# Patient Record
Sex: Male | Born: 1960 | Race: White | Hispanic: No | Marital: Married | Smoking: Former smoker
Health system: Southern US, Community
[De-identification: ages and names within clinical notes are randomized; demographics above are authoritative.]

## PROBLEM LIST (undated history)

## (undated) DIAGNOSIS — M1990 Unspecified osteoarthritis, unspecified site: Secondary | ICD-10-CM

## (undated) DIAGNOSIS — R519 Headache, unspecified: Secondary | ICD-10-CM

## (undated) DIAGNOSIS — E039 Hypothyroidism, unspecified: Secondary | ICD-10-CM

## (undated) DIAGNOSIS — M151 Heberden's nodes (with arthropathy): Secondary | ICD-10-CM

## (undated) DIAGNOSIS — Z87442 Personal history of urinary calculi: Secondary | ICD-10-CM

## (undated) DIAGNOSIS — E876 Hypokalemia: Secondary | ICD-10-CM

## (undated) DIAGNOSIS — F329 Major depressive disorder, single episode, unspecified: Secondary | ICD-10-CM

## (undated) DIAGNOSIS — T8859XA Other complications of anesthesia, initial encounter: Secondary | ICD-10-CM

## (undated) DIAGNOSIS — G5603 Carpal tunnel syndrome, bilateral upper limbs: Secondary | ICD-10-CM

## (undated) DIAGNOSIS — T4145XA Adverse effect of unspecified anesthetic, initial encounter: Secondary | ICD-10-CM

## (undated) DIAGNOSIS — G4730 Sleep apnea, unspecified: Secondary | ICD-10-CM

## (undated) DIAGNOSIS — R51 Headache: Secondary | ICD-10-CM

## (undated) DIAGNOSIS — R5381 Other malaise: Secondary | ICD-10-CM

## (undated) DIAGNOSIS — F32A Depression, unspecified: Secondary | ICD-10-CM

## (undated) DIAGNOSIS — R5383 Other fatigue: Secondary | ICD-10-CM

## (undated) DIAGNOSIS — I10 Essential (primary) hypertension: Secondary | ICD-10-CM

## (undated) DIAGNOSIS — E785 Hyperlipidemia, unspecified: Secondary | ICD-10-CM

## (undated) DIAGNOSIS — D649 Anemia, unspecified: Secondary | ICD-10-CM

## (undated) HISTORY — PX: CIRCUMCISION: SUR203

---

## 2014-10-26 ENCOUNTER — Encounter: Payer: Self-pay | Admitting: *Deleted

## 2014-10-29 ENCOUNTER — Ambulatory Visit: Payer: BLUE CROSS/BLUE SHIELD | Admitting: Anesthesiology

## 2014-10-29 ENCOUNTER — Encounter
Admission: RE | Disposition: A | Discharge status: Home / Self Care | Payer: Self-pay | Source: Ambulatory Visit | Attending: Gastroenterology

## 2014-10-29 ENCOUNTER — Ambulatory Visit
Admission: RE | Admit: 2014-10-29 | Discharge: 2014-10-29 | Disposition: A | Discharge status: Home / Self Care | Payer: BLUE CROSS/BLUE SHIELD | Source: Ambulatory Visit | Attending: Gastroenterology | Admitting: Gastroenterology

## 2014-10-29 DIAGNOSIS — Z87891 Personal history of nicotine dependence: Secondary | ICD-10-CM | POA: Diagnosis not present

## 2014-10-29 DIAGNOSIS — I10 Essential (primary) hypertension: Secondary | ICD-10-CM | POA: Diagnosis not present

## 2014-10-29 DIAGNOSIS — K635 Polyp of colon: Secondary | ICD-10-CM | POA: Insufficient documentation

## 2014-10-29 DIAGNOSIS — E785 Hyperlipidemia, unspecified: Secondary | ICD-10-CM | POA: Diagnosis not present

## 2014-10-29 DIAGNOSIS — Z1211 Encounter for screening for malignant neoplasm of colon: Secondary | ICD-10-CM | POA: Diagnosis present

## 2014-10-29 DIAGNOSIS — Z888 Allergy status to other drugs, medicaments and biological substances status: Secondary | ICD-10-CM | POA: Diagnosis not present

## 2014-10-29 DIAGNOSIS — D649 Anemia, unspecified: Secondary | ICD-10-CM | POA: Diagnosis not present

## 2014-10-29 DIAGNOSIS — Z7982 Long term (current) use of aspirin: Secondary | ICD-10-CM | POA: Insufficient documentation

## 2014-10-29 DIAGNOSIS — D123 Benign neoplasm of transverse colon: Secondary | ICD-10-CM | POA: Diagnosis not present

## 2014-10-29 DIAGNOSIS — Z880 Allergy status to penicillin: Secondary | ICD-10-CM | POA: Diagnosis not present

## 2014-10-29 DIAGNOSIS — E039 Hypothyroidism, unspecified: Secondary | ICD-10-CM | POA: Insufficient documentation

## 2014-10-29 DIAGNOSIS — D122 Benign neoplasm of ascending colon: Secondary | ICD-10-CM | POA: Insufficient documentation

## 2014-10-29 DIAGNOSIS — Z91011 Allergy to milk products: Secondary | ICD-10-CM | POA: Insufficient documentation

## 2014-10-29 HISTORY — DX: Heberden's nodes (with arthropathy): M15.1

## 2014-10-29 HISTORY — DX: Essential (primary) hypertension: I10

## 2014-10-29 HISTORY — DX: Hypokalemia: E87.6

## 2014-10-29 HISTORY — DX: Unspecified osteoarthritis, unspecified site: M19.90

## 2014-10-29 HISTORY — DX: Anemia, unspecified: D64.9

## 2014-10-29 HISTORY — DX: Hyperlipidemia, unspecified: E78.5

## 2014-10-29 HISTORY — PX: COLONOSCOPY WITH PROPOFOL: SHX5780

## 2014-10-29 HISTORY — DX: Hypothyroidism, unspecified: E03.9

## 2014-10-29 HISTORY — DX: Other malaise: R53.81

## 2014-10-29 HISTORY — DX: Other fatigue: R53.83

## 2014-10-29 SURGERY — COLONOSCOPY WITH PROPOFOL
Anesthesia: General

## 2014-10-29 MED ORDER — PROPOFOL 10 MG/ML IV BOLUS
INTRAVENOUS | Status: DC | PRN
  Administered 2014-10-29: 40 mg via INTRAVENOUS
  Administered 2014-10-29: 30 mg via INTRAVENOUS
  Administered 2014-10-29: 100 mg via INTRAVENOUS

## 2014-10-29 MED ORDER — LIDOCAINE HCL (CARDIAC) 20 MG/ML IV SOLN
INTRAVENOUS | Status: DC | PRN
  Administered 2014-10-29: 20 mg via INTRAVENOUS

## 2014-10-29 MED ORDER — SODIUM CHLORIDE 0.9 % IV SOLN
INTRAVENOUS | Status: DC
Start: 2014-10-29 — End: 2014-10-29

## 2014-10-29 MED ORDER — PROPOFOL INFUSION 10 MG/ML OPTIME
INTRAVENOUS | Status: DC | PRN
  Administered 2014-10-29: 100 ug/kg/min via INTRAVENOUS

## 2014-10-29 MED ORDER — GLYCOPYRROLATE 0.2 MG/ML IJ SOLN
INTRAMUSCULAR | Status: DC | PRN
  Administered 2014-10-29: 0.2 mg via INTRAVENOUS

## 2014-10-29 MED ORDER — SODIUM CHLORIDE 0.9 % IV SOLN
INTRAVENOUS | Status: DC
  Administered 2014-10-29: 1000 mL via INTRAVENOUS

## 2014-10-29 NOTE — Op Note (Signed)
[facility] Gastroenterology Patient Name: Tom Barrett Procedure Date: 10/29/2014 10:02 AM MRN: [id number] Account #: [id number] Date of Birth: 08-May-1960 Admit Type: Outpatient Age: 54 Room: [facility] ENDO ROOM 3 Gender: Male Note Status: Finalized Procedure:         Colonoscopy Indications:       Screening for colorectal malignant neoplasm, This is the                     patient's first colonoscopy Patient Profile:   [name]. Providers:         Gerrit Heck. Rayann Heman, MD Referring MD:      Tressia Danas, MD (Referring MD) Medicines:         Propofol per Anesthesia Complications:     No immediate complications. Procedure:         Pre-Anesthesia Assessment:                    - Prior to the procedure, a History and Physical was                     performed, and patient medications, allergies and                     sensitivities were reviewed. The patient's tolerance of                     previous anesthesia was reviewed.                    After obtaining informed consent, the colonoscope was                     passed under direct vision. Throughout the procedure, the                     patient's blood pressure, pulse, and oxygen saturations                     were monitored continuously. The Colonoscope was                     introduced through the anus and advanced to the the cecum,                     identified by appendiceal orifice and ileocecal valve. The                     colonoscopy was performed without difficulty. The patient                     tolerated the procedure well. The quality of the bowel                     preparation was excellent. Findings:      The perianal and digital rectal examinations were normal.      A 3 mm polyp was found in the cecum. The polyp was sessile. The polyp       was removed with a jumbo cold forceps. Resection and retrieval were       complete.      A 3 mm polyp was found in the ascending colon.  The polyp was sessile.       The polyp was removed with a jumbo cold forceps. Resection and retrieval  were complete.      Three sessile polyps were found at the hepatic flexure. The polyps were       2 to 3 mm in size. These polyps were removed with a jumbo cold forceps.       Resection and retrieval were complete.      The exam was otherwise without abnormality on direct and retroflexion       views. Impression:        - One 3 mm polyp in the cecum. Resected and retrieved.                    - One 3 mm polyp in the ascending colon. Resected and                     retrieved.                    - Three 2 to 3 mm polyps at the hepatic flexure. Resected                     and retrieved.                    - The examination was otherwise normal on direct and                     retroflexion views. Recommendation:    - Observe patient in GI recovery unit.                    - High fiber diet.                    - Continue present medications.                    - Await pathology results.                    - Repeat colonoscopy for surveillance based on pathology                     results.                    - The findings and recommendations were discussed with the                     patient.                    - The findings and recommendations were discussed with the                     patient's family. Procedure Code(s): --- Professional ---                    [phone], Colonoscopy, flexible; with biopsy, single or                     multiple CPT copyright 2014 American Medical Association. All rights reserved. The codes documented in this report are preliminary and upon coder review may  be revised to meet current compliance requirements. Mellody Life, MD 10/29/2014 11:05:43 AM This report has been signed electronically. Number of Addenda: 0 Note Initiated On: 10/29/2014 10:02 AM Scope Withdrawal Time: 0 hours 36 minutes 25 seconds  Total Procedure Duration: 0 hours 44  minutes 4 seconds  [facility]

## 2014-10-29 NOTE — Anesthesia Preprocedure Evaluation (Signed)
Anesthesia Evaluation  Patient identified by MRN, date of birth, ID band Patient awake    Reviewed: Allergy & Precautions, H&P , NPO status , Patient's Chart, lab work & pertinent test results, reviewed documented beta blocker date and time   History of Anesthesia Complications Negative for: history of anesthetic complications  Airway Mallampati: II  TM Distance: >3 FB Neck ROM: full    Dental no notable dental hx. (+) Missing, Poor Dentition   Pulmonary neg shortness of breath, neg sleep apnea, neg COPDneg recent URI, former smoker,  breath sounds clear to auscultation  Pulmonary exam normal       Cardiovascular Exercise Tolerance: Good hypertension, - angina- CAD, - Past MI, - Cardiac Stents and - CABG Normal cardiovascular exam- dysrhythmias - Valvular Problems/MurmursRhythm:regular Rate:Normal     Neuro/Psych negative neurological ROS  negative psych ROS   GI/Hepatic negative GI ROS, Neg liver ROS,   Endo/Other  neg diabetesHypothyroidism   Renal/GU negative Renal ROS  negative genitourinary   Musculoskeletal   Abdominal   Peds  Hematology  (+) Blood dyscrasia, anemia ,   Anesthesia Other Findings Past Medical History:   Anemia                                                       Arthritis                                                    Heberden's nodes                                             Malaise and fatigue                                          Hypertension                                                 Hypothyroidism                                               Hyperlipemia                                                 Hypokalemia                                                  Reproductive/Obstetrics negative OB ROS  Anesthesia Physical Anesthesia Plan  ASA: II  Anesthesia Plan: General   Post-op Pain Management:    Induction:    Airway Management Planned:   Additional Equipment:   Intra-op Plan:   Post-operative Plan:   Informed Consent: I have reviewed the patients History and Physical, chart, labs and discussed the procedure including the risks, benefits and alternatives for the proposed anesthesia with the patient or authorized representative who has indicated his/her understanding and acceptance.   Dental Advisory Given  Plan Discussed with: Anesthesiologist, CRNA and Surgeon  Anesthesia Plan Comments:         Anesthesia Quick Evaluation

## 2014-10-29 NOTE — H&P (Signed)
  Primary Care Physician:  Petra Kuba, MD  Pre-Procedure History & Physical: HPI:  Tom Barrett is a 54 y.o. male is here for an colonoscopy.   Past Medical History  Diagnosis Date  . Anemia   . Arthritis   . Heberden's nodes   . Malaise and fatigue   . Hypertension   . Hypothyroidism   . Hyperlipemia   . Hypokalemia     Past Surgical History  Procedure Laterality Date  . Circumcision      Prior to Admission medications   Medication Sig Start Date End Date Taking? Authorizing Provider  aspirin EC 81 MG tablet Take 81 mg by mouth daily.   Yes Historical Provider, MD  diltiazem (CARDIZEM CD) 240 MG 24 hr capsule Take 240 mg by mouth daily.   Yes Historical Provider, MD  doxazosin (CARDURA) 8 MG tablet Take 16 mg by mouth daily.   Yes Historical Provider, MD  hydrochlorothiazide (HYDRODIURIL) 25 MG tablet Take 25 mg by mouth daily.   Yes Historical Provider, MD  levothyroxine (SYNTHROID, LEVOTHROID) 175 MCG tablet Take 175 mcg by mouth daily before breakfast.   Yes Historical Provider, MD  potassium chloride (K-DUR) 10 MEQ tablet Take 10 mEq by mouth 2 (two) times daily.   Yes Historical Provider, MD  quinapril (ACCUPRIL) 40 MG tablet Take 40 mg by mouth 2 (two) times daily.   Yes Historical Provider, MD    Allergies as of 10/02/2014  . (Not on File)    History reviewed. No pertinent family history.  Social History   Social History  . Marital Status: Married    Spouse Name: N/A  . Number of Children: N/A  . Years of Education: N/A   Occupational History  . Not on file.   Social History Main Topics  . Smoking status: Former Research scientist (life sciences)  . Smokeless tobacco: Not on file  . Alcohol Use: No  . Drug Use: No  . Sexual Activity: Not on file   Other Topics Concern  . Not on file   Social History Narrative     Physical Exam: BP 143/94 mmHg  Pulse 90  Temp(Src) 97 F (36.1 C) (Tympanic)  Resp 18  Ht 5\' 11"  (1.803 m)  Wt 102.059 kg (225 lb)  BMI 31.39  kg/m2  SpO2 98% General:   Alert,  pleasant and cooperative in NAD Head:  Normocephalic and atraumatic. Neck:  Supple; no masses or thyromegaly. Lungs:  Clear throughout to auscultation.    Heart:  Regular rate and rhythm. Abdomen:  Soft, nontender and nondistended. Normal bowel sounds, without guarding, and without rebound.   Neurologic:  Alert and  oriented x4;  grossly normal neurologically.  Impression/Plan: TAJUAN DUFAULT is here for an colonoscopy to be performed for screening  Risks, benefits, limitations, and alternatives regarding  colonoscopy have been reviewed with the patient.  Questions have been answered.  All parties agreeable.   Josefine Class, MD  10/29/2014, 10:08 AM

## 2014-10-29 NOTE — Transfer of Care (Signed)
Immediate Anesthesia Transfer of Care Note  Patient: Tom Barrett  Procedure(s) Performed: Procedure(s): COLONOSCOPY WITH PROPOFOL (N/A)  Patient Location: Endoscopy Unit  Anesthesia Type:General  Level of Consciousness: sedated  Airway & Oxygen Therapy: Patient Spontanous Breathing and Patient connected to nasal cannula oxygen  Post-op Assessment: Report given to RN and Post -op Vital signs reviewed and stable  Post vital signs: Reviewed  Last Vitals:  Filed Vitals:   10/29/14 0922  BP: 143/94  Pulse: 90  Temp: 36.1 C  Resp: 18    Complications: No apparent anesthesia complications

## 2014-10-29 NOTE — Anesthesia Procedure Notes (Signed)
Date/Time: 10/29/2014 10:10 AM Performed by: Martha Clan Pre-anesthesia Checklist: Patient identified, Emergency Drugs available, Suction available, Patient being monitored and Timeout performed Patient Re-evaluated:Patient Re-evaluated prior to inductionOxygen Delivery Method: Nasal cannula Preoxygenation: Pre-oxygenation with 100% oxygen Intubation Type: IV induction

## 2014-10-29 NOTE — Discharge Instructions (Signed)

## 2014-10-29 NOTE — Anesthesia Postprocedure Evaluation (Signed)
  Anesthesia Post-op Note  Patient: Tom Barrett  Procedure(s) Performed: Procedure(s): COLONOSCOPY WITH PROPOFOL (N/A)  Anesthesia type:General  Patient location: PACU  Post pain: Pain level controlled  Post assessment: Post-op Vital signs reviewed, Patient's Cardiovascular Status Stable, Respiratory Function Stable, Patent Airway and No signs of Nausea or vomiting  Post vital signs: Reviewed and stable  Last Vitals:  Filed Vitals:   10/29/14 1140  BP: 136/85  Pulse: 57  Temp:   Resp: 13    Level of consciousness: awake, alert  and patient cooperative  Complications: No apparent anesthesia complications

## 2014-10-30 ENCOUNTER — Encounter: Payer: Self-pay | Admitting: Gastroenterology

## 2014-10-30 LAB — SURGICAL PATHOLOGY

## 2015-04-10 ENCOUNTER — Other Ambulatory Visit: Payer: Self-pay | Admitting: Physician Assistant

## 2015-04-26 ENCOUNTER — Ambulatory Visit: Payer: BLUE CROSS/BLUE SHIELD | Attending: Otolaryngology

## 2015-04-26 DIAGNOSIS — R0683 Snoring: Secondary | ICD-10-CM | POA: Insufficient documentation

## 2015-04-26 DIAGNOSIS — G4733 Obstructive sleep apnea (adult) (pediatric): Secondary | ICD-10-CM | POA: Diagnosis not present

## 2015-06-20 ENCOUNTER — Ambulatory Visit: Payer: BLUE CROSS/BLUE SHIELD | Attending: Otolaryngology

## 2015-06-20 DIAGNOSIS — G4733 Obstructive sleep apnea (adult) (pediatric): Secondary | ICD-10-CM | POA: Diagnosis present

## 2017-11-04 ENCOUNTER — Other Ambulatory Visit: Payer: Self-pay

## 2017-11-04 ENCOUNTER — Encounter
Admission: RE | Admit: 2017-11-04 | Discharge: 2017-11-04 | Disposition: A | Discharge status: Home / Self Care | Payer: BLUE CROSS/BLUE SHIELD | Source: Ambulatory Visit | Attending: Surgery | Admitting: Surgery

## 2017-11-04 DIAGNOSIS — Z01812 Encounter for preprocedural laboratory examination: Secondary | ICD-10-CM | POA: Insufficient documentation

## 2017-11-04 DIAGNOSIS — Z01810 Encounter for preprocedural cardiovascular examination: Secondary | ICD-10-CM | POA: Diagnosis not present

## 2017-11-04 DIAGNOSIS — I517 Cardiomegaly: Secondary | ICD-10-CM | POA: Insufficient documentation

## 2017-11-04 DIAGNOSIS — R9431 Abnormal electrocardiogram [ECG] [EKG]: Secondary | ICD-10-CM | POA: Insufficient documentation

## 2017-11-04 HISTORY — DX: Major depressive disorder, single episode, unspecified: F32.9

## 2017-11-04 HISTORY — DX: Headache, unspecified: R51.9

## 2017-11-04 HISTORY — DX: Adverse effect of unspecified anesthetic, initial encounter: T41.45XA

## 2017-11-04 HISTORY — DX: Depression, unspecified: F32.A

## 2017-11-04 HISTORY — DX: Personal history of urinary calculi: Z87.442

## 2017-11-04 HISTORY — DX: Headache: R51

## 2017-11-04 HISTORY — DX: Other complications of anesthesia, initial encounter: T88.59XA

## 2017-11-04 LAB — URINALYSIS, ROUTINE W REFLEX MICROSCOPIC
Bilirubin Urine: NEGATIVE
Glucose, UA: NEGATIVE mg/dL
Hgb urine dipstick: NEGATIVE
Ketones, ur: NEGATIVE mg/dL
Leukocytes, UA: NEGATIVE
NITRITE: NEGATIVE
Protein, ur: NEGATIVE mg/dL
Specific Gravity, Urine: 1.016 (ref 1.005–1.030)
pH: 6 (ref 5.0–8.0)

## 2017-11-04 LAB — CBC WITH DIFFERENTIAL/PLATELET
BASOS ABS: 0.1 10*3/uL (ref 0–0.1)
BASOS PCT: 1 %
EOS ABS: 0.2 10*3/uL (ref 0–0.7)
Eosinophils Relative: 3 %
HCT: 40.9 % (ref 40.0–52.0)
HEMOGLOBIN: 14.2 g/dL (ref 13.0–18.0)
Lymphocytes Relative: 24 %
Lymphs Abs: 1.6 10*3/uL (ref 1.0–3.6)
MCH: 30.6 pg (ref 26.0–34.0)
MCHC: 34.6 g/dL (ref 32.0–36.0)
MCV: 88.6 fL (ref 80.0–100.0)
MONOS PCT: 8 %
Monocytes Absolute: 0.5 10*3/uL (ref 0.2–1.0)
NEUTROS ABS: 4.3 10*3/uL (ref 1.4–6.5)
NEUTROS PCT: 64 %
Platelets: 209 10*3/uL (ref 150–440)
RBC: 4.62 MIL/uL (ref 4.40–5.90)
RDW: 13.3 % (ref 11.5–14.5)
WBC: 6.6 10*3/uL (ref 3.8–10.6)

## 2017-11-04 LAB — COMPREHENSIVE METABOLIC PANEL
ALBUMIN: 4.2 g/dL (ref 3.5–5.0)
ALK PHOS: 75 U/L (ref 38–126)
ALT: 22 U/L (ref 0–44)
ANION GAP: 7 (ref 5–15)
AST: 20 U/L (ref 15–41)
BUN: 17 mg/dL (ref 6–20)
CO2: 28 mmol/L (ref 22–32)
Calcium: 9.2 mg/dL (ref 8.9–10.3)
Chloride: 106 mmol/L (ref 98–111)
Creatinine, Ser: 0.86 mg/dL (ref 0.61–1.24)
GFR calc Af Amer: >60 mL/min (ref >60)
GFR calc non Af Amer: >60 mL/min (ref >60)
GLUCOSE: 87 mg/dL (ref 70–99)
POTASSIUM: 3.2 mmol/L — AB (ref 3.5–5.1)
SODIUM: 141 mmol/L (ref 135–145)
Total Bilirubin: 0.5 mg/dL (ref 0.3–1.2)
Total Protein: 7 g/dL (ref 6.5–8.1)

## 2017-11-04 LAB — PROTIME-INR
INR: 0.98
Prothrombin Time: 12.9 seconds (ref 11.4–15.2)

## 2017-11-04 LAB — TYPE AND SCREEN
ABO/RH(D): O NEG
ANTIBODY SCREEN: NEGATIVE

## 2017-11-04 LAB — SURGICAL PCR SCREEN
MRSA, PCR: NEGATIVE
STAPHYLOCOCCUS AUREUS: NEGATIVE

## 2017-11-04 NOTE — Patient Instructions (Signed)
Your procedure is scheduled on: Tuesday, November 09, 2017  Report to [place]    DO NOT STOP ON THE FIRST FLOOR TO REGISTER  To find out your arrival time please call [phone] between 1PM - 3PM on Monday, November 08, 2017  Remember: Instructions that are not followed completely may result in serious medical risk,  up to and including death, or upon the discretion of your surgeon and anesthesiologist your  surgery may need to be rescheduled.     _X__ 1. Do not eat food after midnight the night before your procedure.                 No gum chewing or hard candies.                       ABSOLUTELY NOTHING SOLID IN YOUR MOUTH AFTER MIDNIGHT                  You may drink clear liquids up to 2 hours before you are scheduled to arrive for your surgery-                   DO not drink clear liquids within 2 hours of the start of your surgery.                 Clear Liquids include:  water, apple juice without pulp, clear carbohydrate                 drink such as Clearfast of Gatorade, Black Coffee or Tea (Do not add                 anything to coffee or tea).  __X__2.  On the morning of surgery brush your teeth with toothpaste and water, you                may rinse your mouth with mouthwash if you wish.  Do not swallow any toothpaste of mouthwash.     _X__ 3.  No Alcohol for 24 hours before or after surgery.   _X__ 4.  Do Not Smoke or use e-cigarettes For 24 Hours Prior to Your Surgery.                 Do not use any chewable tobacco products for at least 6 hours prior to                 surgery.  ____  5.  Bring all medications with you on the day of surgery if instructed.   ____  6.  Notify your doctor if there is any change in your medical condition      (cold, fever, infections).     Do not wear jewelry, make-up, hairpins, clips or nail polish. Do not wear lotions, powders, or perfumes. You may wear deodorant. Do not shave  48 hours prior to surgery. Men may shave face and neck. Do not bring valuables to the hospital.    [facility] is not responsible for any belongings or valuables.  Contacts, dentures or bridgework may not be worn into surgery. Leave your suitcase in the car. After surgery it may be brought to your room. For patients admitted to the hospital, discharge time is determined by your treatment team.   Patients discharged the day of surgery will not be allowed to drive home.   Please read over the following fact sheets that you were  given:   PREPARING FOR YOUR SURGERY     ____ Take these medicines the morning of surgery with A SIP OF WATER:    1.DILTIAZEM  2. SYNTHROID  3.   4.  5.  6.  ____ Fleet Enema (as directed)   __X_ Use CHG Soap as directed  ___X_ Stop ALL ASPIRIN PRODUCTS AS OF TODAY  __X__ Stop Anti-inflammatories AS OF TODAY           THIS INCLUDES IBUPROFEN / MOTRIN / ADVIL / ALEVE   __X__ Stop supplements until after surgery.             THIS INCLUDES KELP AND PYGEUM  ____ Bring C-Pap to the hospital.   CONTINUE TAKING HYDROCHLOROTHIAZIDE AND ACCUPRIL AS USUAL, JUST DO NOT TAKE THEM         ON THE MORNING OF SURGERY  CONTINUE TAKING CARDURA AT NIGHT AS USUAL.  YOU MAY TAKE TYLENOL AT ANY TIME  WEAR STURDY SHOES TO [place], ALONG WITH LOOSE FITTING PANTS.

## 2017-11-05 NOTE — Pre-Procedure Instructions (Signed)
Potassium results of 3.2 sent to Dr. Roland Rack and Anesthesia for review.

## 2017-11-05 NOTE — Pre-Procedure Instructions (Signed)
Discussed abnormal EKG with Dr. Ronelle Nigh. After his review, he requested the PCP also look at EKG and compare it to any completed EKG that may have been done in the office. If it still appears different from prior EKGs, then have PCP send on to a cardiologist. Copy of EKG sent to Dr. Rabinowitz(PCP) and Dr. Roland Rack [facility]).

## 2017-11-08 ENCOUNTER — Encounter: Payer: Self-pay | Admitting: Anesthesiology

## 2017-11-08 MED ORDER — CLINDAMYCIN PHOSPHATE 900 MG/50ML IV SOLN
900.0000 mg | Freq: Once | INTRAVENOUS | Status: AC
  Administered 2017-11-09: 900 mg via INTRAVENOUS

## 2017-11-08 NOTE — Pre-Procedure Instructions (Signed)
Tom Barrett, at pcp called. States repeat ekg this am normal and will fax it and old. Ok by dr Rosey Bath

## 2017-11-09 ENCOUNTER — Encounter
Admission: RE | Disposition: A | Discharge status: Home Health | Payer: Self-pay | Source: Home / Self Care | Attending: Surgery

## 2017-11-09 ENCOUNTER — Inpatient Hospital Stay: Payer: BLUE CROSS/BLUE SHIELD | Admitting: Anesthesiology

## 2017-11-09 ENCOUNTER — Encounter: Payer: Self-pay | Admitting: *Deleted

## 2017-11-09 ENCOUNTER — Inpatient Hospital Stay
Admission: RE | Admit: 2017-11-09 | Discharge: 2017-11-10 | DRG: 470 | Disposition: A | Discharge status: Home Health | Payer: BLUE CROSS/BLUE SHIELD | Attending: Surgery | Admitting: Surgery

## 2017-11-09 ENCOUNTER — Other Ambulatory Visit: Payer: Self-pay

## 2017-11-09 ENCOUNTER — Inpatient Hospital Stay: Payer: BLUE CROSS/BLUE SHIELD

## 2017-11-09 DIAGNOSIS — Z87442 Personal history of urinary calculi: Secondary | ICD-10-CM | POA: Diagnosis not present

## 2017-11-09 DIAGNOSIS — I10 Essential (primary) hypertension: Secondary | ICD-10-CM | POA: Diagnosis present

## 2017-11-09 DIAGNOSIS — Z87891 Personal history of nicotine dependence: Secondary | ICD-10-CM | POA: Diagnosis not present

## 2017-11-09 DIAGNOSIS — Z880 Allergy status to penicillin: Secondary | ICD-10-CM | POA: Diagnosis not present

## 2017-11-09 DIAGNOSIS — G43909 Migraine, unspecified, not intractable, without status migrainosus: Secondary | ICD-10-CM | POA: Diagnosis present

## 2017-11-09 DIAGNOSIS — E785 Hyperlipidemia, unspecified: Secondary | ICD-10-CM | POA: Diagnosis present

## 2017-11-09 DIAGNOSIS — Z888 Allergy status to other drugs, medicaments and biological substances status: Secondary | ICD-10-CM | POA: Diagnosis not present

## 2017-11-09 DIAGNOSIS — M25561 Pain in right knee: Secondary | ICD-10-CM | POA: Diagnosis present

## 2017-11-09 DIAGNOSIS — Z91011 Allergy to milk products: Secondary | ICD-10-CM

## 2017-11-09 DIAGNOSIS — Z96651 Presence of right artificial knee joint: Secondary | ICD-10-CM

## 2017-11-09 DIAGNOSIS — Z79890 Hormone replacement therapy (postmenopausal): Secondary | ICD-10-CM | POA: Diagnosis not present

## 2017-11-09 DIAGNOSIS — E039 Hypothyroidism, unspecified: Secondary | ICD-10-CM | POA: Diagnosis present

## 2017-11-09 DIAGNOSIS — G4730 Sleep apnea, unspecified: Secondary | ICD-10-CM | POA: Diagnosis present

## 2017-11-09 DIAGNOSIS — Z79899 Other long term (current) drug therapy: Secondary | ICD-10-CM | POA: Diagnosis not present

## 2017-11-09 DIAGNOSIS — M1711 Unilateral primary osteoarthritis, right knee: Secondary | ICD-10-CM | POA: Diagnosis present

## 2017-11-09 HISTORY — PX: TOTAL KNEE ARTHROPLASTY: SHX125

## 2017-11-09 LAB — POCT I-STAT 4, (NA,K, GLUC, HGB,HCT)
GLUCOSE: 98 mg/dL (ref 70–99)
HCT: 39 % (ref 39.0–52.0)
HEMOGLOBIN: 13.3 g/dL (ref 13.0–17.0)
POTASSIUM: 3.2 mmol/L — AB (ref 3.5–5.1)
SODIUM: 142 mmol/L (ref 135–145)

## 2017-11-09 LAB — ABO/RH: ABO/RH(D): O NEG

## 2017-11-09 IMAGING — DX DG KNEE 1-2V PORT*R*
2 series · 2 of 2 positions shown · non-contrast
Comparison: None.

CLINICAL DATA: 57-year-old male post right knee replacement.
Initial encounter.

EXAM:
PORTABLE RIGHT KNEE - 1-2 VIEW

[knee ap]
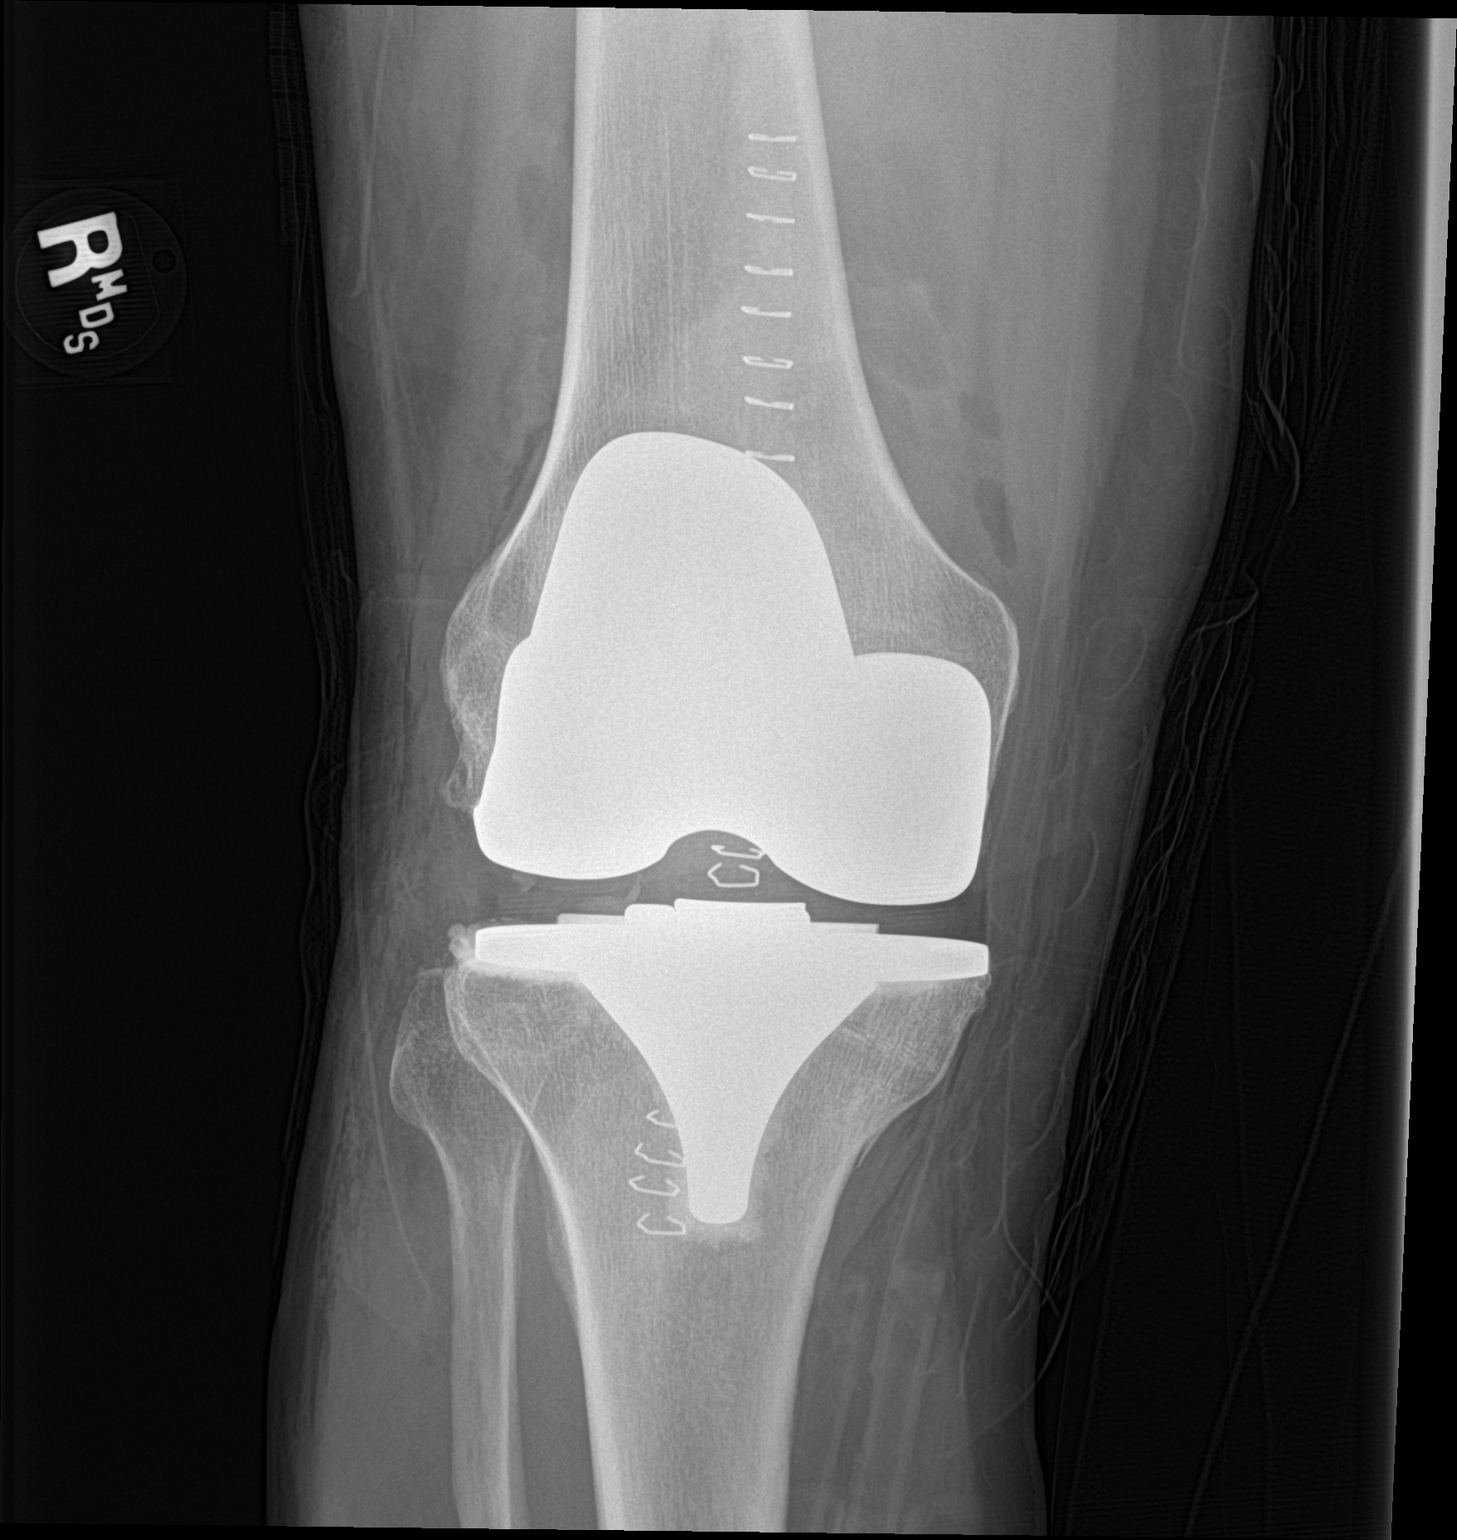

[knee lat]
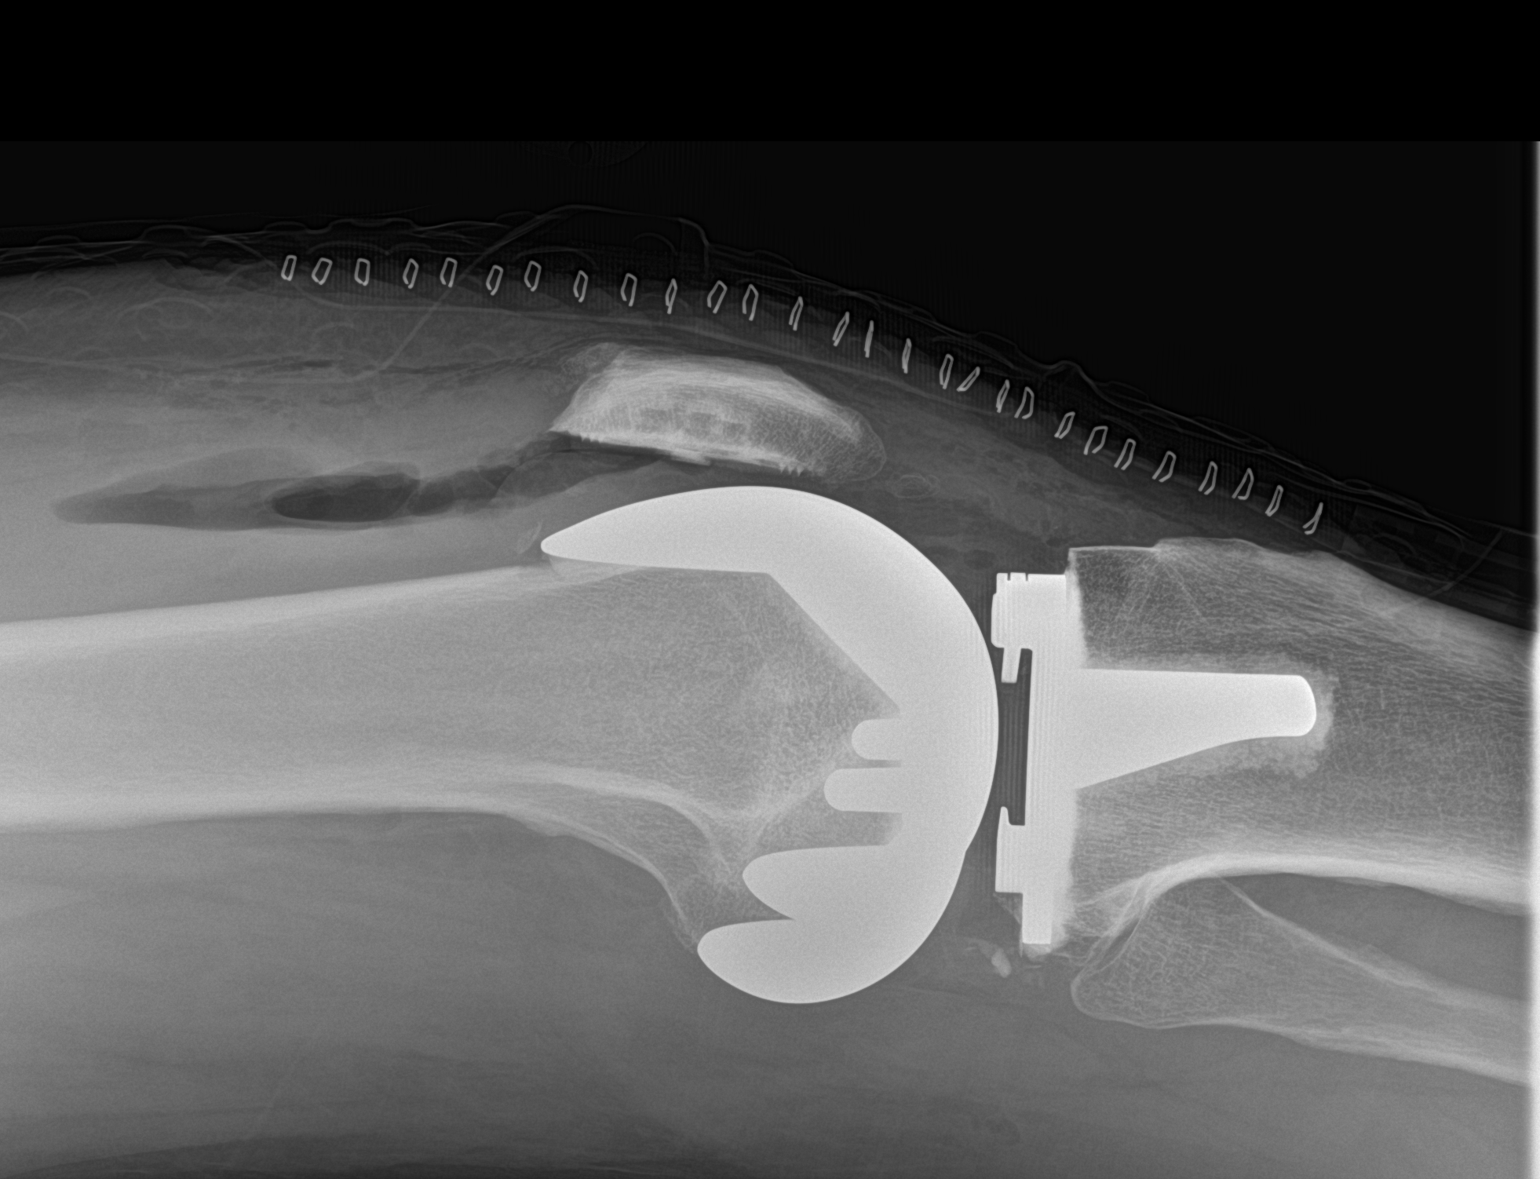

[2 of 2 positions shown; findings below may reference images not displayed]

FINDINGS: Post total right knee replacement which appears in satisfactory
position without complication noted.
IMPRESSION: Post total right knee replacement.

## 2017-11-09 SURGERY — ARTHROPLASTY, KNEE, TOTAL
Anesthesia: Spinal | Site: Knee | Laterality: Right | Wound class: Clean

## 2017-11-09 MED ORDER — MAGNESIUM HYDROXIDE 400 MG/5ML PO SUSP
30.0000 mL | Freq: Every day | ORAL | Status: DC | PRN
  Administered 2017-11-09 – 2017-11-10 (×2): 30 mL via ORAL
  Filled 2017-11-09 (×2): qty 30

## 2017-11-09 MED ORDER — ROCURONIUM BROMIDE 100 MG/10ML IV SOLN
INTRAVENOUS | Status: DC | PRN
  Administered 2017-11-09: 80 mg via INTRAVENOUS

## 2017-11-09 MED ORDER — TRANEXAMIC ACID 1000 MG/10ML IV SOLN
INTRAVENOUS | Status: AC
  Filled 2017-11-09: qty 10

## 2017-11-09 MED ORDER — LACTATED RINGERS IV SOLN
INTRAVENOUS | Status: DC
  Administered 2017-11-09: 07:00:00 via INTRAVENOUS

## 2017-11-09 MED ORDER — FENTANYL CITRATE (PF) 100 MCG/2ML IJ SOLN
INTRAMUSCULAR | Status: DC | PRN
  Administered 2017-11-09 (×2): 50 ug via INTRAVENOUS

## 2017-11-09 MED ORDER — DEXAMETHASONE SODIUM PHOSPHATE 4 MG/ML IJ SOLN
INTRAMUSCULAR | Status: DC | PRN
  Administered 2017-11-09: 5 mg via INTRAVENOUS

## 2017-11-09 MED ORDER — SODIUM CHLORIDE 0.9 % IJ SOLN
INTRAMUSCULAR | Status: AC
  Filled 2017-11-09: qty 50

## 2017-11-09 MED ORDER — DOCUSATE SODIUM 100 MG PO CAPS
100.0000 mg | ORAL_CAPSULE | Freq: Two times a day (BID) | ORAL | Status: DC
  Administered 2017-11-09 – 2017-11-10 (×3): 100 mg via ORAL
  Filled 2017-11-09 (×3): qty 1

## 2017-11-09 MED ORDER — PHENYLEPHRINE HCL 10 MG/ML IJ SOLN
INTRAMUSCULAR | Status: AC
  Filled 2017-11-09: qty 1

## 2017-11-09 MED ORDER — ACETAMINOPHEN 10 MG/ML IV SOLN
INTRAVENOUS | Status: AC
  Filled 2017-11-09: qty 100

## 2017-11-09 MED ORDER — FAMOTIDINE 20 MG PO TABS
ORAL_TABLET | ORAL | Status: AC
  Filled 2017-11-09: qty 1

## 2017-11-09 MED ORDER — ONDANSETRON HCL 4 MG/2ML IJ SOLN: 4.0000 mg | Freq: Four times a day (QID) | INTRAMUSCULAR | Status: DC | PRN

## 2017-11-09 MED ORDER — OXYCODONE HCL 5 MG PO TABS
5.0000 mg | ORAL_TABLET | ORAL | Status: DC | PRN
  Administered 2017-11-09: 5 mg via ORAL

## 2017-11-09 MED ORDER — LIDOCAINE HCL (CARDIAC) PF 100 MG/5ML IV SOSY
PREFILLED_SYRINGE | INTRAVENOUS | Status: DC | PRN
  Administered 2017-11-09: 100 mg via INTRAVENOUS

## 2017-11-09 MED ORDER — FENTANYL CITRATE (PF) 100 MCG/2ML IJ SOLN
INTRAMUSCULAR | Status: AC
  Administered 2017-11-09: 25 ug via INTRAVENOUS
  Filled 2017-11-09: qty 2

## 2017-11-09 MED ORDER — FAMOTIDINE 20 MG PO TABS
20.0000 mg | ORAL_TABLET | Freq: Once | ORAL | Status: AC
  Administered 2017-11-09: 20 mg via ORAL

## 2017-11-09 MED ORDER — KELP 0.15 MG PO TABS: ORAL_TABLET | Freq: Every day | ORAL | Status: DC

## 2017-11-09 MED ORDER — METOCLOPRAMIDE HCL 5 MG/ML IJ SOLN: 5.0000 mg | Freq: Three times a day (TID) | INTRAMUSCULAR | Status: DC | PRN

## 2017-11-09 MED ORDER — BUPIVACAINE LIPOSOME 1.3 % IJ SUSP
INTRAMUSCULAR | Status: AC
  Filled 2017-11-09: qty 20

## 2017-11-09 MED ORDER — MIDAZOLAM HCL 2 MG/2ML IJ SOLN
INTRAMUSCULAR | Status: AC
  Filled 2017-11-09: qty 2

## 2017-11-09 MED ORDER — HYDROMORPHONE HCL 1 MG/ML IJ SOLN: 0.5000 mg | INTRAMUSCULAR | Status: DC | PRN

## 2017-11-09 MED ORDER — FENTANYL CITRATE (PF) 100 MCG/2ML IJ SOLN
25.0000 ug | INTRAMUSCULAR | Status: AC | PRN
  Administered 2017-11-09 (×6): 25 ug via INTRAVENOUS

## 2017-11-09 MED ORDER — GLYCOPYRROLATE 0.2 MG/ML IJ SOLN
INTRAMUSCULAR | Status: AC
  Filled 2017-11-09: qty 1

## 2017-11-09 MED ORDER — LIDOCAINE HCL (PF) 2 % IJ SOLN
INTRAMUSCULAR | Status: AC
  Filled 2017-11-09: qty 10

## 2017-11-09 MED ORDER — NEOMYCIN-POLYMYXIN B GU 40-200000 IR SOLN
Status: DC | PRN
  Administered 2017-11-09: 14 mL

## 2017-11-09 MED ORDER — TRANEXAMIC ACID 1000 MG/10ML IV SOLN
INTRAVENOUS | Status: DC | PRN
  Administered 2017-11-09: 1000 mg via INTRAVENOUS

## 2017-11-09 MED ORDER — HYDROMORPHONE HCL 1 MG/ML IJ SOLN
INTRAMUSCULAR | Status: AC
  Filled 2017-11-09: qty 1

## 2017-11-09 MED ORDER — BUPIVACAINE-EPINEPHRINE (PF) 0.5% -1:200000 IJ SOLN
INTRAMUSCULAR | Status: AC
  Filled 2017-11-09: qty 30

## 2017-11-09 MED ORDER — REMIFENTANIL HCL 1 MG IV SOLR
INTRAVENOUS | Status: DC | PRN
  Administered 2017-11-09: .15 ug/kg/min via INTRAVENOUS

## 2017-11-09 MED ORDER — CLINDAMYCIN PHOSPHATE 900 MG/50ML IV SOLN
INTRAVENOUS | Status: AC
  Filled 2017-11-09: qty 50

## 2017-11-09 MED ORDER — PROPOFOL 10 MG/ML IV BOLUS
INTRAVENOUS | Status: AC
  Filled 2017-11-09: qty 20

## 2017-11-09 MED ORDER — MIDAZOLAM HCL 5 MG/5ML IJ SOLN
INTRAMUSCULAR | Status: DC | PRN
  Administered 2017-11-09: 2 mg via INTRAVENOUS

## 2017-11-09 MED ORDER — BUPIVACAINE-EPINEPHRINE (PF) 0.5% -1:200000 IJ SOLN
INTRAMUSCULAR | Status: DC | PRN
  Administered 2017-11-09: 30 mL via PERINEURAL

## 2017-11-09 MED ORDER — NETTLE-PYGEUM AFRICANUM 300-25 MG PO CAPS: ORAL_CAPSULE | Freq: Every day | ORAL | Status: DC

## 2017-11-09 MED ORDER — QUINAPRIL HCL 10 MG PO TABS
40.0000 mg | ORAL_TABLET | Freq: Two times a day (BID) | ORAL | Status: DC
  Administered 2017-11-09 – 2017-11-10 (×2): 40 mg via ORAL
  Filled 2017-11-09 (×3): qty 4

## 2017-11-09 MED ORDER — DILTIAZEM HCL ER COATED BEADS 240 MG PO CP24
240.0000 mg | ORAL_CAPSULE | Freq: Every day | ORAL | Status: DC
  Administered 2017-11-10: 240 mg via ORAL
  Filled 2017-11-09: qty 1

## 2017-11-09 MED ORDER — SUGAMMADEX SODIUM 200 MG/2ML IV SOLN
INTRAVENOUS | Status: DC | PRN
  Administered 2017-11-09: 200 mg via INTRAVENOUS

## 2017-11-09 MED ORDER — CLINDAMYCIN PHOSPHATE 900 MG/50ML IV SOLN
900.0000 mg | Freq: Four times a day (QID) | INTRAVENOUS | Status: AC
  Administered 2017-11-09 – 2017-11-10 (×3): 900 mg via INTRAVENOUS
  Filled 2017-11-09 (×3): qty 50

## 2017-11-09 MED ORDER — ACETAMINOPHEN 325 MG PO TABS: 325.0000 mg | ORAL_TABLET | Freq: Four times a day (QID) | ORAL | Status: DC | PRN

## 2017-11-09 MED ORDER — FLEET ENEMA 7-19 GM/118ML RE ENEM: 1.0000 | ENEMA | Freq: Once | RECTAL | Status: DC | PRN

## 2017-11-09 MED ORDER — DOXAZOSIN MESYLATE 8 MG PO TABS
8.0000 mg | ORAL_TABLET | Freq: Every day | ORAL | Status: DC
  Administered 2017-11-09 – 2017-11-10 (×2): 8 mg via ORAL
  Filled 2017-11-09 (×2): qty 1

## 2017-11-09 MED ORDER — ONDANSETRON HCL 4 MG/2ML IJ SOLN: 4.0000 mg | Freq: Once | INTRAMUSCULAR | Status: DC | PRN

## 2017-11-09 MED ORDER — PROPOFOL 10 MG/ML IV BOLUS
INTRAVENOUS | Status: DC | PRN
  Administered 2017-11-09: 180 mg via INTRAVENOUS

## 2017-11-09 MED ORDER — DIPHENHYDRAMINE HCL 12.5 MG/5ML PO ELIX: 12.5000 mg | ORAL_SOLUTION | ORAL | Status: DC | PRN

## 2017-11-09 MED ORDER — EPHEDRINE SULFATE 50 MG/ML IJ SOLN
INTRAMUSCULAR | Status: DC | PRN
  Administered 2017-11-09: 10 mg via INTRAVENOUS

## 2017-11-09 MED ORDER — SUGAMMADEX SODIUM 200 MG/2ML IV SOLN
INTRAVENOUS | Status: AC
  Filled 2017-11-09: qty 2

## 2017-11-09 MED ORDER — ENOXAPARIN SODIUM 40 MG/0.4ML ~~LOC~~ SOLN
40.0000 mg | SUBCUTANEOUS | Status: DC
  Administered 2017-11-10: 40 mg via SUBCUTANEOUS
  Filled 2017-11-09: qty 0.4

## 2017-11-09 MED ORDER — ACETAMINOPHEN 500 MG PO TABS
1000.0000 mg | ORAL_TABLET | Freq: Four times a day (QID) | ORAL | Status: DC
  Administered 2017-11-09 – 2017-11-10 (×3): 1000 mg via ORAL
  Filled 2017-11-09 (×3): qty 2

## 2017-11-09 MED ORDER — KETOROLAC TROMETHAMINE 15 MG/ML IJ SOLN
15.0000 mg | Freq: Four times a day (QID) | INTRAMUSCULAR | Status: DC
  Administered 2017-11-09 – 2017-11-10 (×3): 15 mg via INTRAVENOUS
  Filled 2017-11-09 (×3): qty 1

## 2017-11-09 MED ORDER — PROPOFOL 500 MG/50ML IV EMUL
INTRAVENOUS | Status: AC
  Filled 2017-11-09: qty 50

## 2017-11-09 MED ORDER — BISACODYL 10 MG RE SUPP: 10.0000 mg | Freq: Every day | RECTAL | Status: DC | PRN

## 2017-11-09 MED ORDER — TRAMADOL HCL 50 MG PO TABS
50.0000 mg | ORAL_TABLET | Freq: Four times a day (QID) | ORAL | Status: DC | PRN
  Filled 2017-11-09: qty 1

## 2017-11-09 MED ORDER — ACETAMINOPHEN 10 MG/ML IV SOLN
INTRAVENOUS | Status: DC | PRN
  Administered 2017-11-09: 1000 mg via INTRAVENOUS

## 2017-11-09 MED ORDER — ONDANSETRON HCL 4 MG PO TABS: 4.0000 mg | ORAL_TABLET | Freq: Four times a day (QID) | ORAL | Status: DC | PRN

## 2017-11-09 MED ORDER — KETOROLAC TROMETHAMINE 30 MG/ML IJ SOLN
30.0000 mg | Freq: Once | INTRAMUSCULAR | Status: AC
  Administered 2017-11-09: 30 mg via INTRAVENOUS

## 2017-11-09 MED ORDER — OXYCODONE HCL 5 MG PO TABS
ORAL_TABLET | ORAL | Status: AC
  Administered 2017-11-09: 5 mg via ORAL
  Filled 2017-11-09: qty 1

## 2017-11-09 MED ORDER — NEOMYCIN-POLYMYXIN B GU 40-200000 IR SOLN
Status: AC
  Filled 2017-11-09: qty 20

## 2017-11-09 MED ORDER — HYDROCHLOROTHIAZIDE 12.5 MG PO CAPS
12.5000 mg | ORAL_CAPSULE | Freq: Every day | ORAL | Status: DC
  Administered 2017-11-09 – 2017-11-10 (×2): 12.5 mg via ORAL
  Filled 2017-11-09 (×2): qty 1

## 2017-11-09 MED ORDER — POTASSIUM CHLORIDE IN NACL 20-0.9 MEQ/L-% IV SOLN
INTRAVENOUS | Status: DC
  Administered 2017-11-09: 14:00:00 via INTRAVENOUS
  Filled 2017-11-09 (×3): qty 1000

## 2017-11-09 MED ORDER — REMIFENTANIL HCL 1 MG IV SOLR
INTRAVENOUS | Status: AC
  Filled 2017-11-09: qty 1000

## 2017-11-09 MED ORDER — LEVOTHYROXINE SODIUM 137 MCG PO TABS
274.0000 ug | ORAL_TABLET | Freq: Every day | ORAL | Status: DC
  Administered 2017-11-10: 274 ug via ORAL
  Filled 2017-11-09: qty 2

## 2017-11-09 MED ORDER — KETOROLAC TROMETHAMINE 30 MG/ML IJ SOLN
INTRAMUSCULAR | Status: AC
  Administered 2017-11-09: 30 mg via INTRAVENOUS
  Filled 2017-11-09: qty 1

## 2017-11-09 MED ORDER — PANTOPRAZOLE SODIUM 40 MG PO TBEC
40.0000 mg | DELAYED_RELEASE_TABLET | Freq: Every day | ORAL | Status: DC
  Administered 2017-11-09 – 2017-11-10 (×2): 40 mg via ORAL
  Filled 2017-11-09 (×2): qty 1

## 2017-11-09 MED ORDER — SODIUM CHLORIDE 0.9 % IV SOLN
INTRAVENOUS | Status: DC | PRN
  Administered 2017-11-09: 60 mL

## 2017-11-09 MED ORDER — METOCLOPRAMIDE HCL 10 MG PO TABS: 5.0000 mg | ORAL_TABLET | Freq: Three times a day (TID) | ORAL | Status: DC | PRN

## 2017-11-09 MED ORDER — ONDANSETRON HCL 4 MG/2ML IJ SOLN
INTRAMUSCULAR | Status: DC | PRN
  Administered 2017-11-09: 4 mg via INTRAVENOUS

## 2017-11-09 MED ORDER — FENTANYL CITRATE (PF) 100 MCG/2ML IJ SOLN
INTRAMUSCULAR | Status: AC
  Filled 2017-11-09: qty 2

## 2017-11-09 MED ORDER — BUPIVACAINE HCL (PF) 0.5 % IJ SOLN
INTRAMUSCULAR | Status: AC
  Filled 2017-11-09: qty 30

## 2017-11-09 SURGICAL SUPPLY — 61 items
BANDAGE ELASTIC 6 LF NS (GAUZE/BANDAGES/DRESSINGS) ×2 IMPLANT
BEARING TIBIAL KNEE AS 10M 79M (Knees) ×2 IMPLANT
BLADE SAW SAG 25X90X1.19 (BLADE) ×2 IMPLANT
BLADE SURG SZ20 CARB STEEL (BLADE) ×2 IMPLANT
CANISTER SUCT 1200ML W/VALVE (MISCELLANEOUS) ×2 IMPLANT
CANISTER SUCT 3000ML PPV (MISCELLANEOUS) ×2 IMPLANT
CEMENT BONE R 1X40 (Cement) ×4 IMPLANT
CEMENT VACUUM MIXING SYSTEM (MISCELLANEOUS) ×2 IMPLANT
CHLORAPREP W/TINT 26ML (MISCELLANEOUS) ×2 IMPLANT
COMP FEMORAL CRUC RIGHT 75MM (Joint) ×2 IMPLANT
COMPONENT FEMRL CRUC RT 75MM (Joint) ×1 IMPLANT
COOLER POLAR GLACIER W/PUMP (MISCELLANEOUS) ×2 IMPLANT
COVER MAYO STAND STRL (DRAPES) ×2 IMPLANT
CUFF TOURN 24 STER (MISCELLANEOUS) IMPLANT
CUFF TOURN 30 STER DUAL PORT (MISCELLANEOUS) ×2 IMPLANT
DRAPE IMP U-DRAPE 54X76 (DRAPES) ×2 IMPLANT
DRAPE INCISE IOBAN 66X45 STRL (DRAPES) ×2 IMPLANT
DRAPE SHEET LG 3/4 BI-LAMINATE (DRAPES) ×2 IMPLANT
DRSG OPSITE POSTOP 4X10 (GAUZE/BANDAGES/DRESSINGS) ×2 IMPLANT
DRSG OPSITE POSTOP 4X8 (GAUZE/BANDAGES/DRESSINGS) IMPLANT
DRSG TEGADERM 2-3/8X2-3/4 SM (GAUZE/BANDAGES/DRESSINGS) ×2 IMPLANT
ELECT CAUTERY BLADE 6.4 (BLADE) ×2 IMPLANT
ELECT REM PT RETURN 9FT ADLT (ELECTROSURGICAL) ×2
ELECTRODE REM PT RTRN 9FT ADLT (ELECTROSURGICAL) ×1 IMPLANT
GLOVE BIO SURGEON STRL SZ7.5 (GLOVE) ×8 IMPLANT
GLOVE BIO SURGEON STRL SZ8 (GLOVE) ×8 IMPLANT
GLOVE BIOGEL PI IND STRL 8 (GLOVE) ×1 IMPLANT
GLOVE BIOGEL PI INDICATOR 8 (GLOVE) ×1
GLOVE INDICATOR 8.0 STRL GRN (GLOVE) ×2 IMPLANT
GOWN STRL REUS W/ TWL LRG LVL3 (GOWN DISPOSABLE) ×1 IMPLANT
GOWN STRL REUS W/ TWL XL LVL3 (GOWN DISPOSABLE) ×1 IMPLANT
GOWN STRL REUS W/TWL LRG LVL3 (GOWN DISPOSABLE) ×1
GOWN STRL REUS W/TWL XL LVL3 (GOWN DISPOSABLE) ×1
HOLDER FOLEY CATH W/STRAP (MISCELLANEOUS) ×2 IMPLANT
HOOD PEEL AWAY FLYTE STAYCOOL (MISCELLANEOUS) ×6 IMPLANT
IMMBOLIZER KNEE 19 BLUE UNIV (SOFTGOODS) ×2 IMPLANT
KIT TURNOVER KIT A (KITS) ×2 IMPLANT
NDL SAFETY ECLIPSE 18X1.5 (NEEDLE) ×2 IMPLANT
NEEDLE HYPO 18GX1.5 SHARP (NEEDLE) ×2
NEEDLE SPNL 20GX3.5 QUINCKE YW (NEEDLE) ×2 IMPLANT
NS IRRIG 1000ML POUR BTL (IV SOLUTION) ×2 IMPLANT
PACK TOTAL KNEE (MISCELLANEOUS) ×2 IMPLANT
PAD WRAPON POLAR KNEE (MISCELLANEOUS) ×1 IMPLANT
PEG PATELLA SERIES A 37MMX10MM (Orthopedic Implant) ×2 IMPLANT
PLATE KNEE TIBIAL 79MM FIXED (Plate) ×2 IMPLANT
PULSAVAC PLUS IRRIG FAN TIP (DISPOSABLE) ×2
SOL .9 NS 3000ML IRR  AL (IV SOLUTION) ×1
SOL .9 NS 3000ML IRR UROMATIC (IV SOLUTION) ×1 IMPLANT
SPONGE GAUZE 2X2 8PLY STRL LF (GAUZE/BANDAGES/DRESSINGS) ×2 IMPLANT
STAPLER SKIN PROX 35W (STAPLE) ×2 IMPLANT
SUCTION FRAZIER HANDLE 10FR (MISCELLANEOUS) ×1
SUCTION TUBE FRAZIER 10FR DISP (MISCELLANEOUS) ×1 IMPLANT
SUT VIC AB 0 CT1 36 (SUTURE) ×6 IMPLANT
SUT VIC AB 2-0 CT1 27 (SUTURE) ×3
SUT VIC AB 2-0 CT1 TAPERPNT 27 (SUTURE) ×3 IMPLANT
SYR 10ML LL (SYRINGE) ×2 IMPLANT
SYR 20CC LL (SYRINGE) ×2 IMPLANT
SYR 30ML LL (SYRINGE) ×6 IMPLANT
TIP FAN IRRIG PULSAVAC PLUS (DISPOSABLE) ×1 IMPLANT
TRAY FOLEY MTR SLVR 16FR STAT (SET/KITS/TRAYS/PACK) IMPLANT
WRAPON POLAR PAD KNEE (MISCELLANEOUS) ×2

## 2017-11-09 NOTE — Progress Notes (Signed)
PHARMACIST - PHYSICIAN ORDER COMMUNICATION  CONCERNING: P&T Medication Policy on Herbal Medications  DESCRIPTION:  This patient's order for:  Kelp and nettle-pygeum africanum  has been noted.  This product(s) is classified as an "herbal" or natural product. Due to a lack of definitive safety studies or FDA approval, nonstandard manufacturing practices, plus the potential risk of unknown drug-drug interactions while on inpatient medications, the Pharmacy and Therapeutics Committee does not permit the use of "herbal" or natural products of this type within [facility].   ACTION TAKEN: The pharmacy department is unable to verify this order at this time  Please reevaluate patient's clinical condition at discharge and address if the herbal or natural product(s) should be resumed at that time.

## 2017-11-09 NOTE — Progress Notes (Signed)
   11/09/17 0700  Clinical Encounter Type  Visited With Patient and family together  Visit Type Initial;Spiritual support  Recommendations Follow-up as requested.  Spiritual Encounters  Spiritual Needs Prayer;Emotional  Stress Factors  Patient Stress Factors  (Pre-surgery nervousness.)   Chaplain observed that the patient seemed anxious. Chaplain engaged the patient and wife in conversation, talking through concerns about the surgery and post-op recovery. Patient appeared to relax and was less jittery. Chaplain provided a calming presence and prayed for the patient and surgical team.

## 2017-11-09 NOTE — Anesthesia Procedure Notes (Signed)
Procedure Name: Intubation Date/Time: 11/09/2017 8:24 AM Performed by: Bernardo Heater, CRNA Pre-anesthesia Checklist: Patient identified, Emergency Drugs available, Suction available and Patient being monitored Patient Re-evaluated:Patient Re-evaluated prior to induction Oxygen Delivery Method: Circle system utilized Preoxygenation: Pre-oxygenation with 100% oxygen Induction Type: IV induction Laryngoscope Size: Mac and 3 Grade View: Grade I Tube size: 7.0 mm Number of attempts: 1 Placement Confirmation: ETT inserted through vocal cords under direct vision,  positive ETCO2 and breath sounds checked- equal and bilateral Secured at: 23 cm Tube secured with: Tape Dental Injury: Teeth and Oropharynx as per pre-operative assessment

## 2017-11-09 NOTE — Evaluation (Signed)
Physical Therapy Evaluation Patient Details Name: Tom Barrett MRN: [id number] DOB: 03-08-1960 Today's Date: 11/09/2017   History of Present Illness  57 y/o male here s/p R TKA 9/10.  Clinical Impression  Pt did very well with POD0 PT exam.  He was able to ambulate well with walker, had 2-110 ROM, was able to do SLRs and showed independent with bed mobility along with good tolerance with WBing.  He was very motivated to push himself and is eager to get back to his PLOF.  Pt participated with ~15 minutes of supine exercises and ~10 minutes of gait/mobility training apart from the PT exam and overall did well with all tasks.      Follow Up Recommendations Follow surgeon's recommendation for DC plan and follow-up therapies;Home health PT    Equipment Recommendations       Recommendations for Other Services       Precautions / Restrictions Precautions Precautions: Knee;Fall Restrictions Weight Bearing Restrictions: Yes RLE Weight Bearing: Weight bearing as tolerated      Mobility  Bed Mobility Overal bed mobility: Independent             General bed mobility comments: Pt able to easily get to sitting EOB w/o assist  Transfers Overall transfer level: Independent Equipment used: Rolling walker (2 wheeled)             General transfer comment: Pt able to rise to standing w/o assist.  Showed good confidence, safety but unable to achieve TKE   Ambulation/Gait Ambulation/Gait assistance: Supervision Gait Distance (Feet): 100 Feet Assistive device: Rolling walker (2 wheeled)       General Gait Details: Pt is able to ambulate with constant forward walker motion and good safety.  He was able to improve mechanics of working toward TKE/heel strike but did have some issue with this.  Otherwise excellent first bout of walking post-op.  Stairs            Wheelchair Mobility    Modified Rankin (Stroke Patients Only)       Balance Overall balance assessment:  Modified Independent                                           Pertinent Vitals/Pain Pain Assessment: 0-10 Pain Score: 3 (at rest, increases considerable with mvt)    Home Living Family/patient expects to be discharged to:: Private residence Living Arrangements: Spouse/significant other Available Help at Discharge: Family (wife is returning to work Fri 9/13)  Type of Home: House Home Access: Stairs to enter Entrance Stairs-Rails: Left;Right(WIDE) Technical brewer of Steps: 5 Home Layout: Multi-level;Able to live on main level with bedroom/bathroom        Prior Function Level of Independence: Independent         Comments: Pt able to be very active, does 20 flights of steps QD for work     Hand Dominance        Extremity/Trunk Assessment   Upper Extremity Assessment Upper Extremity Assessment: Overall WFL for tasks assessed    Lower Extremity Assessment Lower Extremity Assessment: Overall WFL for tasks assessed       Communication   Communication: No difficulties  Cognition Arousal/Alertness: Awake/alert Behavior During Therapy: WFL for tasks assessed/performed Overall Cognitive Status: Within Functional Limits for tasks assessed  General Comments      Exercises Total Joint Exercises Ankle Circles/Pumps: Strengthening;10 reps Quad Sets: Strengthening;10 reps Gluteal Sets: Strengthening;10 reps Heel Slides: Strengthening;AROM;10 reps Hip ABduction/ADduction: Strengthening;10 reps Straight Leg Raises: AAROM;AROM;10 reps Knee Flexion: PROM;5 reps Goniometric ROM: 2-110 (103 degrees AROM)   Assessment/Plan    PT Assessment Patient needs continued PT services  PT Problem List Decreased strength;Decreased activity tolerance;Decreased range of motion;Decreased balance;Decreased mobility;Decreased coordination;Decreased knowledge of use of DME;Decreased safety awareness;Pain        PT Treatment Interventions DME instruction;Gait training;Stair training;Functional mobility training;Therapeutic activities;Therapeutic exercise;Balance training;Cognitive remediation;Patient/family education    PT Goals (Current goals can be found in the Care Plan section)  Acute Rehab PT Goals Patient Stated Goal: get back to work PT Goal Formulation: With patient Time For Goal Achievement: 11/23/17 Potential to Achieve Goals: Good    Frequency BID   Barriers to discharge        Co-evaluation               AM-PAC PT "6 Clicks" Daily Activity  Outcome Measure Difficulty turning over in bed (including adjusting bedclothes, sheets and blankets)?: None Difficulty moving from lying on back to sitting on the side of the bed? : None Difficulty sitting down on and standing up from a chair with arms (e.g., wheelchair, bedside commode, etc,.)?: None Help needed moving to and from a bed to chair (including a wheelchair)?: None Help needed walking in hospital room?: None Help needed climbing 3-5 steps with a railing? : A Little 6 Click Score: 23    End of Session Equipment Utilized During Treatment: Gait belt Activity Tolerance: Patient tolerated treatment well Patient left: with chair alarm set;with call bell/phone within reach;with family/visitor present Nurse Communication: Mobility status PT Visit Diagnosis: Muscle weakness (generalized) (M62.81);Difficulty in walking, not elsewhere classified (R26.2)    Time: 2800-3491 PT Time Calculation (min) (ACUTE ONLY): 44 min   Charges:   PT Evaluation $PT Eval Low Complexity: 1 Low PT Treatments $Gait Training: 8-22 mins $Therapeutic Exercise: 8-22 mins        Kreg Shropshire, DPT 11/09/2017, 5:31 PM

## 2017-11-09 NOTE — H&P (Signed)
Paper H&P to be scanned into permanent record. H&P reviewed and patient re-examined. No changes. 

## 2017-11-09 NOTE — Op Note (Signed)
11/09/2017  10:42 AM  Patient:   Tom Barrett  Pre-Op Diagnosis:   Degenerative joint disease, right knee.  Post-Op Diagnosis:   Same  Procedure:   Right TKA using all-cemented Biomet Vanguard system with a 75 mm PCR femur, a 79 mm tibial tray with a 10 mm AS E-poly insert, and a 37 x 10 mm all-poly 3-pegged domed patella.  Surgeon:   Pascal Lux, MD  Assistant:   Cameron Proud, PA-C   Anesthesia:   Attempted spinal to GET  Findings:   As above  Complications:   None  EBL:   10 cc  Fluids:   1000 cc crystalloid  UOP:   None  TT:   96 minutes at 300 mmHg  Drains:   None  Closure:   Staples  Implants:   As above  Brief Clinical Note:   The patient is a 57 year old male with a long history of progressively worsening right knee pain. The patient's symptoms have progressed despite medications, activity modification, injections, etc. The patient's history and examination were consistent with advanced degenerative joint disease of the right knee confirmed by plain radiographs. The patient presents at this time for a right total knee arthroplasty.  Procedure:   The patient was brought into the operating room. After an attempted spinal anesthesia was unsuccessful, the patient was lain in the supine position where he underwent accessible general endotracheal intubation and anesthesia. The right lower extremity was prepped with ChloraPrep solution and draped sterilely. Preoperative antibiotics were administered. After verifying the proper laterality with a surgical timeout, the limb was exsanguinated with an Esmarch and the tourniquet inflated to 300 mmHg. A standard anterior approach to the knee was made through an approximately 7 inch incision. The incision was carried down through the subcutaneous tissues to expose superficial retinaculum. This was split the length of the incision and the medial flap elevated sufficiently to expose the medial retinaculum. The medial retinaculum was  incised, leaving a 3-4 mm cuff of tissue on the patella. This was extended distally along the medial border of the patellar tendon and proximally through the medial third of the quadriceps tendon. A subtotal fat pad excision was performed before the soft tissues were elevated off the anteromedial and anterolateral aspects of the proximal tibia to the level of the collateral ligaments. The anterior portions of the medial and lateral menisci were removed, as was the anterior cruciate ligament. With the knee flexed to 90, the external tibial guide was positioned and the appropriate proximal tibial cut made. This piece was taken to the back table where it was measured and found to be optimally replicated by a 79 mm component.  Attention was directed to the distal femur. The intramedullary canal was accessed through a 3/8" drill hole. The intramedullary guide was inserted and position in order to obtain a neutral flexion gap. The intercondylar block was positioned with care taken to avoid notching the anterior cortex of the femur. The appropriate cut was made. Next, the distal cutting block was placed at 6 of valgus alignment. Using the 9 mm slot, the distal cut was made. The distal femur was measured and found to be optimally replicated by the 75 mm component. The 75 mm 4-in-1 cutting block was positioned and first the posterior, then the posterior chamfer, the anterior chamfer, femoral and intercondylar cuts were made. At this point, the posterior portions medial and lateral menisci were removed. A trial reduction was performed using the appropriate femoral and tibial components  with the 10 mm insert. This demonstrated excellent stability to varus and valgus stressing both in flexion and extension while permitting full extension. Patella tracking was assessed and found to be excellent. Therefore, the tibial guide position was marked on the proximal tibia. The patella thickness was measured and found to be 23 mm.  Therefore, the appropriate cut was made. The patellar surface was measured and found to be optimally replicated by the 37 mm component. The three peg holes were drilled in place before the trial button was inserted. Patella tracking was assessed and found to be excellent, passing the "no thumb test". The lug holes were drilled into the distal femur before the trial component was removed, leaving only the tibial tray. The keel was then created using the appropriate tower, reamer, and punch.  The bony surfaces were prepared for cementing by irrigating thoroughly with bacitracin saline solution. A bone plug was fashioned from some of the bone that had been removed previously and used to plug the distal femoral canal. In addition, 20 cc of Exparel diluted out to 60 cc with normal saline and 30 cc of 0.5% Sensorcaine were injected into the postero-medial and postero-lateral aspects of the knee, the medial and lateral gutter regions, and the peri-incisional tissues to help with postoperative analgesia. Meanwhile, the cement was being mixed on the back table. When it was ready, the tibial tray was cemented in first. The excess cement was removed using [employer] fast streamer. Next, the femoral component was impacted into place. Again, the excess cement was removed using [employer] fast streamer. The 10 mm trial insert was positioned and the knee brought into extension while the cement hardened. Finally, the patella was cemented into place and secured using the patellar clamp. Again, the excess cement was removed using [employer] fast streamer. Once the cement had hardened, the knee was placed through a range of motion with the findings as described above. Therefore, the trial insert was removed and, after verifying that no cement had been retained posteriorly, the permanent 10 mm AS E-polyethylene insert was positioned and secured using the appropriate key locking mechanism. Again the knee was placed through a range of motion with the findings as  described above.  The wound was copiously irrigated with bacitracin saline solution using the jet lavage system before the quadriceps tendon and retinacular layer were reapproximated using #0 Vicryl interrupted sutures. The superficial retinacular layer also was closed using a running #0 Vicryl suture. A total of 10 cc of transexemic acid (TXA) was injected intra-articularly before the subcutaneous tissues were closed in several layers using 2-0 Vicryl interrupted sutures. The skin was closed using staples. A sterile honeycomb dressing was applied to the skin before the leg was wrapped with an Ace wrap to accommodate the Polar Care device. The patient was then awakened, extubated, and returned to the recovery room in satisfactory condition after tolerating the procedure well.

## 2017-11-09 NOTE — NC FL2 (Signed)
[place] LEVEL OF CARE SCREENING TOOL     IDENTIFICATION  Patient Name: Tom Barrett Birthdate: Jun 28, 1960 Sex: male Admission Date (Current Location): 11/09/2017  [place] and [place] Number:  Engineering geologist and Address:  [facility], [address], [place], [place] [address]      Provider Number: [id number]  Attending Physician Name and Address:  Corky Mull, MD  Relative Name and Phone Number:       Current Level of Care: Hospital Recommended Level of Care: [place] Prior Approval Number:    Date Approved/Denied:   PASRR Number: [id number])  Discharge Plan: SNF    Current Diagnoses: Patient Active Problem List   Diagnosis Date Noted  . Status post total knee replacement using cement, right 11/09/2017    Orientation RESPIRATION BLADDER Height & Weight     Self, Time, Situation, Place  Normal Continent Weight: 233 lb 14.5 oz (106.1 kg) Height:  5\' 11"  (180.3 cm)  BEHAVIORAL SYMPTOMS/MOOD NEUROLOGICAL BOWEL NUTRITION STATUS      Continent Diet(Diet: Heart Healthy/ Carb Modified. )  AMBULATORY STATUS COMMUNICATION OF NEEDS Skin   Extensive Assist Verbally Surgical wounds(Incision: Right Knee. )                       Personal Care Assistance Level of Assistance  Bathing, Feeding, Dressing Bathing Assistance: Limited assistance Feeding assistance: Independent Dressing Assistance: Limited assistance     Functional Limitations Info  Sight, Hearing, Speech Sight Info: Adequate Hearing Info: Adequate Speech Info: Adequate    SPECIAL CARE FACTORS FREQUENCY  PT (By licensed PT), OT (By licensed OT)     PT Frequency: (5) OT Frequency: (5)            Contractures      Additional Factors Info  Code Status, Allergies Code Status Info: (Full Code. ) Allergies Info: (Milk-related Compounds, Amlodipine, Metoprolol, Penicillins)           Current Medications (11/09/2017):  This is the  current hospital active medication list Current Facility-Administered Medications  Medication Dose Route Frequency Provider Last Rate Last Dose  . 0.9 % NaCl with KCl 20 mEq/ L  infusion   Intravenous Continuous Poggi, Marshall Cork, MD 75 mL/hr at 11/09/17 1419    . acetaminophen (TYLENOL) tablet 1,000 mg  1,000 mg Oral Q6H Poggi, Marshall Cork, MD   1,000 mg at 11/09/17 1650  . [START ON 11/10/2017] acetaminophen (TYLENOL) tablet 325-650 mg  325-650 mg Oral Q6H PRN Poggi, Marshall Cork, MD      . bisacodyl (DULCOLAX) suppository 10 mg  10 mg Rectal Daily PRN Poggi, Marshall Cork, MD      . clindamycin (CLEOCIN) IVPB 900 mg  900 mg Intravenous Q6H Poggi, Marshall Cork, MD 100 mL/hr at 11/09/17 1421 900 mg at 11/09/17 1421  . [START ON 11/10/2017] diltiazem (CARDIZEM CD) 24 hr capsule 240 mg  240 mg Oral Daily Poggi, Marshall Cork, MD      . diphenhydrAMINE (BENADRYL) 12.5 MG/5ML elixir 12.5-25 mg  12.5-25 mg Oral Q4H PRN Poggi, Marshall Cork, MD      . docusate sodium (COLACE) capsule 100 mg  100 mg Oral BID Poggi, Marshall Cork, MD   100 mg at 11/09/17 1424  . doxazosin (CARDURA) tablet 8 mg  8 mg Oral Daily Poggi, Marshall Cork, MD   8 mg at 11/09/17 1425  . [START ON 11/10/2017] enoxaparin (LOVENOX) injection 40 mg  40 mg Subcutaneous  Q24H Poggi, Marshall Cork, MD      . famotidine (PEPCID) 20 MG tablet           . hydrochlorothiazide (MICROZIDE) capsule 12.5 mg  12.5 mg Oral Daily Poggi, Marshall Cork, MD   12.5 mg at 11/09/17 1424  . HYDROmorphone (DILAUDID) injection 0.5-1 mg  0.5-1 mg Intravenous Q4H PRN Poggi, Marshall Cork, MD      . ketorolac (TORADOL) 15 MG/ML injection 15 mg  15 mg Intravenous Q6H Poggi, Marshall Cork, MD   15 mg at 11/09/17 1649  . [START ON 11/10/2017] levothyroxine (SYNTHROID, LEVOTHROID) tablet 274 mcg  274 mcg Oral QAC breakfast Poggi, Marshall Cork, MD      . magnesium hydroxide (MILK OF MAGNESIA) suspension 30 mL  30 mL Oral Daily PRN Poggi, Marshall Cork, MD   30 mL at 11/09/17 1426  . metoCLOPramide (REGLAN) tablet 5-10 mg  5-10 mg Oral Q8H PRN Poggi, Marshall Cork, MD        Or  . metoCLOPramide (REGLAN) injection 5-10 mg  5-10 mg Intravenous Q8H PRN Poggi, Marshall Cork, MD      . ondansetron (ZOFRAN) tablet 4 mg  4 mg Oral Q6H PRN Poggi, Marshall Cork, MD       Or  . ondansetron (ZOFRAN) injection 4 mg  4 mg Intravenous Q6H PRN Poggi, Marshall Cork, MD      . oxyCODONE (Oxy IR/ROXICODONE) immediate release tablet 5-10 mg  5-10 mg Oral Q4H PRN Poggi, Marshall Cork, MD   5 mg at 11/09/17 1147  . pantoprazole (PROTONIX) EC tablet 40 mg  40 mg Oral Daily Poggi, Marshall Cork, MD   40 mg at 11/09/17 1424  . quinapril (ACCUPRIL) tablet 40 mg  40 mg Oral BID Corky Mull, MD   40 mg at 11/09/17 1651  . sodium phosphate (FLEET) 7-19 GM/118ML enema 1 enema  1 enema Rectal Once PRN Poggi, Marshall Cork, MD      . traMADol Veatrice Bourbon) tablet 50 mg  50 mg Oral Q6H PRN Poggi, Marshall Cork, MD         Discharge Medications: Please see discharge summary for a list of discharge medications.  Relevant Imaging Results:  Relevant Lab Results:   Additional Information (SSN: [id number])  Cigi Bega, Veronia Beets, LCSW

## 2017-11-09 NOTE — Anesthesia Preprocedure Evaluation (Addendum)
Anesthesia Evaluation  Patient identified by MRN, date of birth, ID band Patient awake    Reviewed: Allergy & Precautions, NPO status , Patient's Chart, lab work & pertinent test results, reviewed documented beta blocker date and time   Airway Mallampati: III  TM Distance: >3 FB     Dental  (+) Chipped   Pulmonary former smoker,           Cardiovascular hypertension, Pt. on medications      Neuro/Psych  Headaches, PSYCHIATRIC DISORDERS Depression    GI/Hepatic   Endo/Other  Hypothyroidism   Renal/GU      Musculoskeletal  (+) Arthritis ,   Abdominal   Peds  Hematology  (+) anemia ,   Anesthesia Other Findings EKG ok.  Reproductive/Obstetrics                             Anesthesia Physical Anesthesia Plan  ASA: II  Anesthesia Plan: Spinal and General   Post-op Pain Management:    Induction:   PONV Risk Score and Plan:   Airway Management Planned:   Additional Equipment:   Intra-op Plan:   Post-operative Plan:   Informed Consent: I have reviewed the patients History and Physical, chart, labs and discussed the procedure including the risks, benefits and alternatives for the proposed anesthesia with the patient or authorized representative who has indicated his/her understanding and acceptance.     Plan Discussed with: CRNA  Anesthesia Plan Comments:        Anesthesia Quick Evaluation

## 2017-11-09 NOTE — Transfer of Care (Signed)
Immediate Anesthesia Transfer of Care Note  Patient: Tom Barrett  Procedure(s) Performed: TOTAL KNEE ARTHROPLASTY (Right Knee)  Patient Location: PACU  Anesthesia Type:General  Level of Consciousness: awake and patient cooperative  Airway & Oxygen Therapy: Patient Spontanous Breathing and Patient connected to nasal cannula oxygen  Post-op Assessment: Report given to RN and Post -op Vital signs reviewed and stable  Post vital signs: Reviewed and stable  Last Vitals:  Vitals Value Taken Time  BP    Temp    Pulse 99 11/09/2017 10:41 AM  Resp 10 11/09/2017 10:41 AM  SpO2 98 % 11/09/2017 10:41 AM  Vitals shown include unvalidated device data.  Last Pain:  Vitals:   11/09/17 0604  TempSrc: Oral  PainSc: 2          Complications: No apparent anesthesia complications

## 2017-11-09 NOTE — Progress Notes (Signed)
Chaplain responded to an OR for anAD, Pt was still feeling the effects from surgery. Family ws at the the bedside. Chaplain left brochure for review. Family will call when and if needed. Family had no additional needs.    11/09/17 1304  Clinical Encounter Type  Visited With Patient and family together  Visit Type Follow-up  Referral From Nurse  Spiritual Encounters  Spiritual Needs Brochure  Advance Directives (For Healthcare)  Does Patient Have a Medical Advance Directive? No  Would patient like information on creating a medical advance directive? Yes (Inpatient - patient requests chaplain consult to create a medical advance directive)  [place]  Would patient like information on creating a mental health advance directive? No - Patient declined

## 2017-11-09 NOTE — Anesthesia Post-op Follow-up Note (Signed)
Anesthesia QCDR form completed.        

## 2017-11-09 NOTE — Progress Notes (Signed)
Pt received from PACU via hospital bed without incident per MD order accompanied by wife. Rept received from [facility] from PACU. Pt vital signs stable on admission. No s/sx distress and no c/o such. Pt denies pain on admit to the unit. Pt and wife oriented to call bed, bed alarm and unit routines. Both verbalize understanding. Will continue to monitor.

## 2017-11-10 ENCOUNTER — Encounter: Payer: Self-pay | Admitting: Surgery

## 2017-11-10 LAB — BASIC METABOLIC PANEL
Anion gap: 9 (ref 5–15)
BUN: 18 mg/dL (ref 6–20)
CALCIUM: 9 mg/dL (ref 8.9–10.3)
CO2: 27 mmol/L (ref 22–32)
CREATININE: 0.98 mg/dL (ref 0.61–1.24)
Chloride: 103 mmol/L (ref 98–111)
GFR calc Af Amer: >60 mL/min (ref >60)
GLUCOSE: 120 mg/dL — AB (ref 70–99)
Potassium: 3.4 mmol/L — ABNORMAL LOW (ref 3.5–5.1)
Sodium: 139 mmol/L (ref 135–145)

## 2017-11-10 LAB — CBC WITH DIFFERENTIAL/PLATELET
BASOS PCT: 0 %
Band Neutrophils: 0 %
Basophils Absolute: 0 10*3/uL (ref 0–0.1)
Blasts: 0 %
EOS PCT: 0 %
Eosinophils Absolute: 0 10*3/uL (ref 0–0.7)
HCT: 38.4 % — ABNORMAL LOW (ref 40.0–52.0)
HEMOGLOBIN: 13.2 g/dL (ref 13.0–18.0)
LYMPHS ABS: 0.7 10*3/uL — AB (ref 1.0–3.6)
Lymphocytes Relative: 6 %
MCH: 30.2 pg (ref 26.0–34.0)
MCHC: 34.3 g/dL (ref 32.0–36.0)
MCV: 88.1 fL (ref 80.0–100.0)
MONOS PCT: 7 %
MYELOCYTES: 0 %
Metamyelocytes Relative: 0 %
Monocytes Absolute: 0.8 10*3/uL (ref 0.2–1.0)
NEUTROS ABS: 10.5 10*3/uL — AB (ref 1.4–6.5)
NEUTROS PCT: 87 %
OTHER: 0 %
PLATELETS: 181 10*3/uL (ref 150–440)
Promyelocytes Relative: 0 %
RBC: 4.36 MIL/uL — AB (ref 4.40–5.90)
RDW: 13.7 % (ref 11.5–14.5)
WBC: 12 10*3/uL — ABNORMAL HIGH (ref 3.8–10.6)
nRBC: 0 /100 WBC

## 2017-11-10 MED ORDER — POTASSIUM CHLORIDE 20 MEQ PO PACK
20.0000 meq | PACK | Freq: Once | ORAL | Status: AC
  Administered 2017-11-10: 20 meq via ORAL
  Filled 2017-11-10: qty 1

## 2017-11-10 MED ORDER — ENOXAPARIN SODIUM 40 MG/0.4ML ~~LOC~~ SOLN: 40.0000 mg | SUBCUTANEOUS | 0 refills | Status: DC

## 2017-11-10 MED ORDER — OXYCODONE HCL 5 MG PO TABS: 5.0000 mg | ORAL_TABLET | ORAL | 0 refills | Status: DC | PRN

## 2017-11-10 MED ORDER — TRAMADOL HCL 50 MG PO TABS: 50.0000 mg | ORAL_TABLET | Freq: Four times a day (QID) | ORAL | 0 refills | Status: DC | PRN

## 2017-11-10 NOTE — Progress Notes (Signed)
DISCHARGE NOTE:  Pt. Discharged home. Discharge instructions and medications regimen reviewed at bedside with patient and wife. Pt. verbalizes understanding of instructions and medication regimen. Prescriptions oxycodone, tramadol lovenox given to pt. Lovenox teaching done with pt and wife. They verbalized understanding. TED hose on both legs. Pt wheeled to car by staff member.
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
## 2017-11-10 NOTE — Discharge Instructions (Signed)

## 2017-11-10 NOTE — Progress Notes (Signed)
Clinical Social Worker (CSW) received SNF consult. PT is recommending home health. RN case manager aware of above. Please reconsult if future social work needs arise. CSW signing off.   Denis Koppel, LCSW [phone] 

## 2017-11-10 NOTE — Progress Notes (Signed)
Talked with Dr. Roland Rack, patient may be discharged if he passes all his physical therapy, passing gas, and pain is under control. No BM needed to go home today.
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
## 2017-11-10 NOTE — Progress Notes (Signed)
Physical Therapy Treatment Patient Details Name: Tom Barrett MRN: [id number] DOB: 01-22-1961 Today's Date: 11/10/2017    History of Present Illness 57 y/o male here s/p R TKA 9/10.    PT Comments    Pt is making good progress and is safe to dc home this day. Pt has performed stair training and ambulation around RN station. Gave HEP and pt is independent with all there-ex. Good progress with AROM. Pt very eager to work with therapy and return to work. Will plan to dc at this time, RN notified.   Follow Up Recommendations  Home health PT     Equipment Recommendations  None recommended by PT(has all equipment)    Recommendations for Other Services       Precautions / Restrictions Precautions Precautions: Knee;Fall Precaution Booklet Issued: Yes (comment) Restrictions Weight Bearing Restrictions: Yes RLE Weight Bearing: Weight bearing as tolerated    Mobility  Bed Mobility               General bed mobility comments: not performed as pt received in chair  Transfers Overall transfer level: Independent Equipment used: Rolling walker (2 wheeled)             General transfer comment: safe technique with upright posture. RW used for all mobility  Ambulation/Gait Ambulation/Gait assistance: Supervision Gait Distance (Feet): 200 Feet Assistive device: Rolling walker (2 wheeled) Gait Pattern/deviations: Step-through pattern     General Gait Details: pt able to ambulate with reciprocal gait pattern and safe technique. Cues for keeping head in forward direction and heel strike. CUes to keep RW moving to advance reciprocal gait   Stairs Stairs: Yes Stairs assistance: Min guard Stair Management: One rail Right Number of Stairs: 4 General stair comments: navigated stairs with step to gait pattern and safe technique. Educated on [place] prior to performance.    Wheelchair Mobility    Modified Rankin (Stroke Patients Only)       Balance                                             Cognition Arousal/Alertness: Awake/alert Behavior During Therapy: WFL for tasks assessed/performed Overall Cognitive Status: Within Functional Limits for tasks assessed                                        Exercises Total Joint Exercises Goniometric ROM: 0-95 degrees AROM of R knee Other Exercises Other Exercises: seated ther-ex performed including R LE ankle pumps, quad sets, SLRs, hip ab/add, SAQ, and seated knee flexion stretches. All ther-ex performed x 12 reps with supervision and safe technique    General Comments        Pertinent Vitals/Pain Pain Assessment: 0-10 Pain Score: 5  Pain Location: R knee Pain Descriptors / Indicators: Operative site guarding Pain Intervention(s): Limited activity within patient's tolerance;Ice applied    Home Living                      Prior Function            PT Goals (current goals can now be found in the care plan section) Acute Rehab PT Goals Patient Stated Goal: get back to work PT Goal Formulation: With patient Time For Goal Achievement: 11/23/17 Potential to Achieve  Goals: Good Progress towards PT goals: Progressing toward goals    Frequency    BID      PT Plan Current plan remains appropriate    Co-evaluation              AM-PAC PT "6 Clicks" Daily Activity  Outcome Measure  Difficulty turning over in bed (including adjusting bedclothes, sheets and blankets)?: None Difficulty moving from lying on back to sitting on the side of the bed? : None Difficulty sitting down on and standing up from a chair with arms (e.g., wheelchair, bedside commode, etc,.)?: None Help needed moving to and from a bed to chair (including a wheelchair)?: None Help needed walking in hospital room?: None Help needed climbing 3-5 steps with a railing? : None 6 Click Score: 24    End of Session Equipment Utilized During Treatment: Gait belt Activity Tolerance:  Patient tolerated treatment well Patient left: with chair alarm set;with call bell/phone within reach;with family/visitor present Nurse Communication: Mobility status PT Visit Diagnosis: Muscle weakness (generalized) (M62.81);Difficulty in walking, not elsewhere classified (R26.2)     Time: 5784-6962 PT Time Calculation (min) (ACUTE ONLY): 26 min  Charges:  $Gait Training: 8-22 mins $Therapeutic Exercise: 8-22 mins                     Greggory Stallion, PT, DPT [phone]    [place] 11/10/2017, 10:40 AM

## 2017-11-10 NOTE — Anesthesia Postprocedure Evaluation (Signed)
Anesthesia Post Note  Patient: Tom Barrett  Procedure(s) Performed: TOTAL KNEE ARTHROPLASTY (Right Knee)  Patient location during evaluation: Nursing Unit Anesthesia Type: Spinal Level of consciousness: oriented and awake and alert Pain management: pain level controlled Vital Signs Assessment: post-procedure vital signs reviewed and stable Respiratory status: spontaneous breathing Cardiovascular status: blood pressure returned to baseline and stable Postop Assessment: no headache, no backache, no apparent nausea or vomiting, spinal receding and patient able to bend at knees Anesthetic complications: no     Last Vitals:  Vitals:   11/10/17 0516 11/10/17 0733  BP: 137/86 (!) 163/99  Pulse: 91 91  Resp: 18 20  Temp: 36.7 C 36.6 C  SpO2: 96% 97%    Last Pain:  Vitals:   11/10/17 0733  TempSrc: Oral  PainSc:                  Precious Haws Angelino Rumery

## 2017-11-11 ENCOUNTER — Other Ambulatory Visit: Payer: Self-pay | Admitting: Student

## 2017-11-11 ENCOUNTER — Ambulatory Visit
Admission: RE | Admit: 2017-11-11 | Discharge: 2017-11-11 | Disposition: A | Discharge status: Home / Self Care | Payer: BLUE CROSS/BLUE SHIELD | Source: Ambulatory Visit | Attending: Student | Admitting: Student

## 2017-11-11 DIAGNOSIS — M79604 Pain in right leg: Secondary | ICD-10-CM | POA: Diagnosis not present

## 2017-11-11 IMAGING — US US EXTREM LOW VENOUS*R*
1 series · 13 of 24 positions shown · non-contrast
Comparison: None.

CLINICAL DATA: Right lower extremity pain. History recent right
knee arthroplasty on [DATE].



[Series 1: us extrem low venous*right* · 13 of 37 slices shown]
[im 1/37]
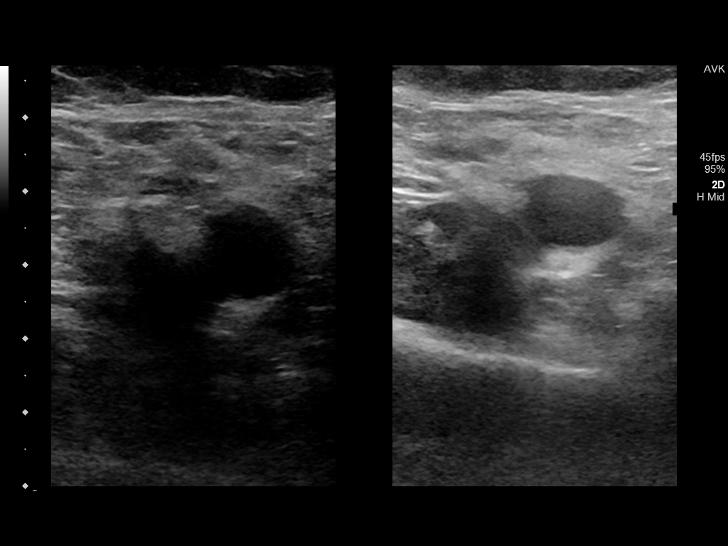
[im 4/37]
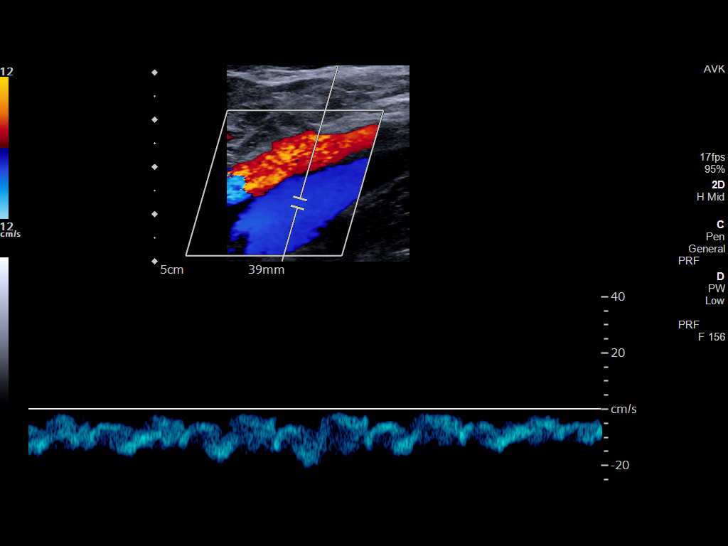
[im 7/37]
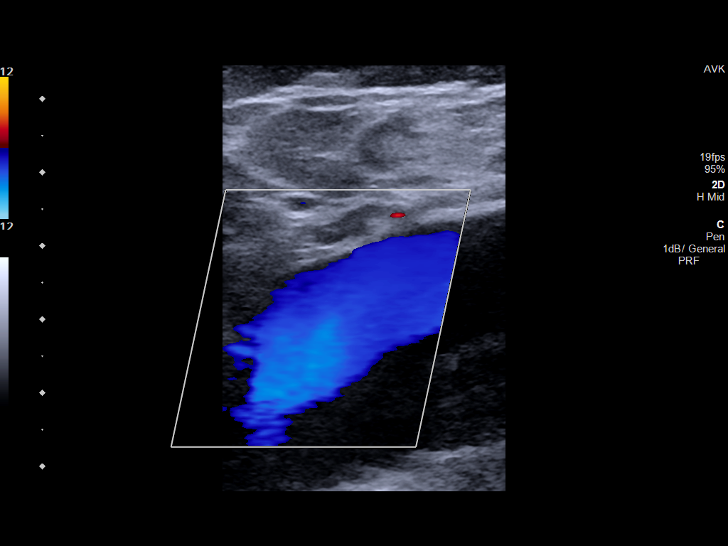
[im 10/37]
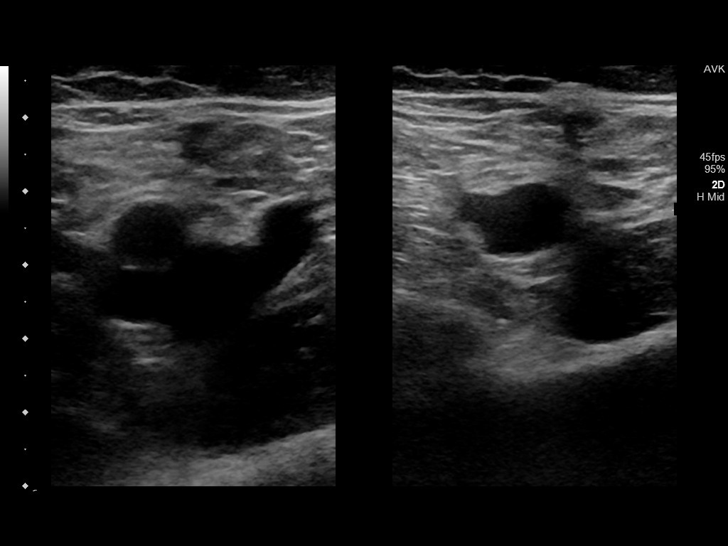
[im 13/37]
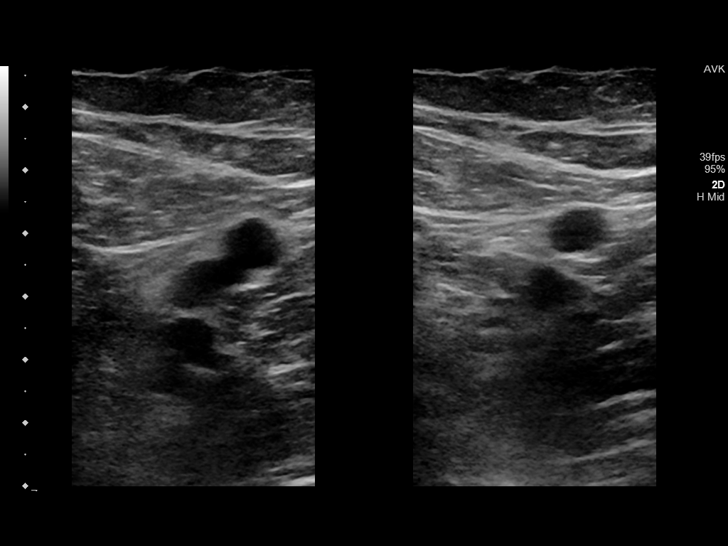
[im 16/37]
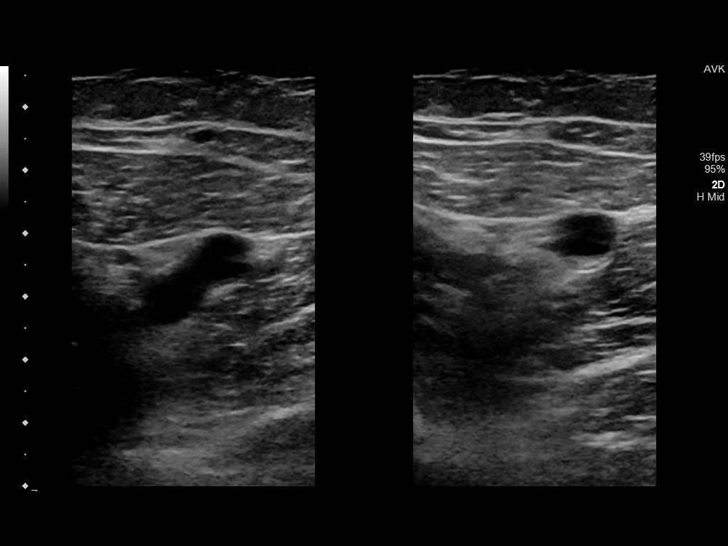
[im 19/37]
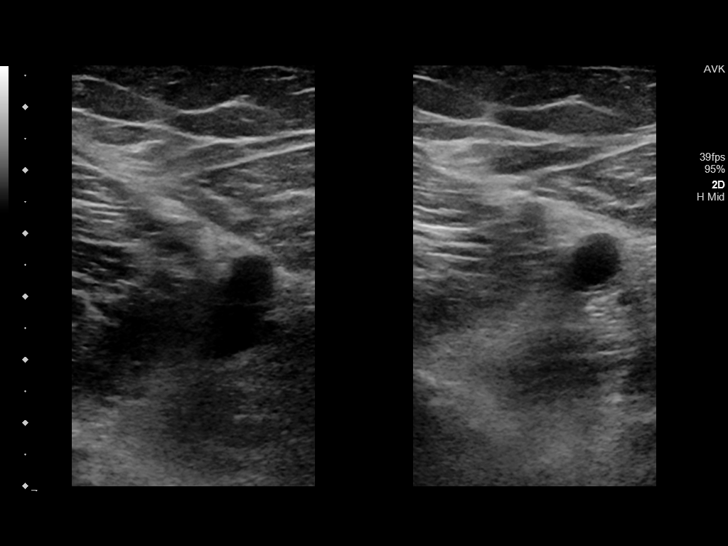
[im 21/37]
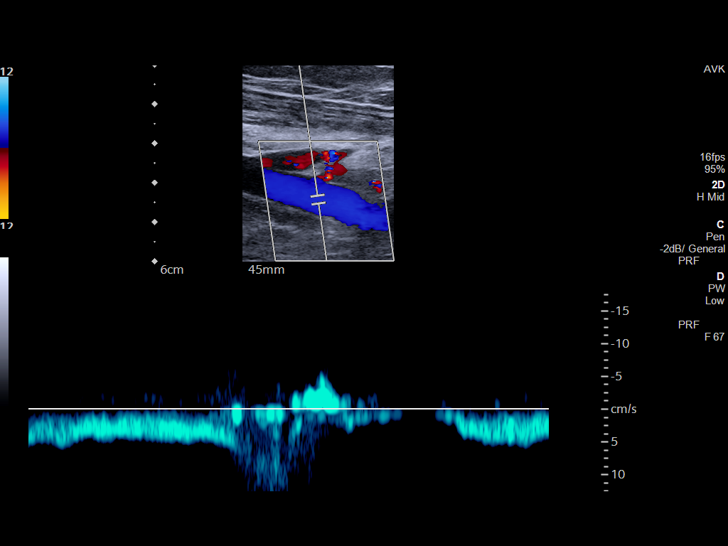
[im 24/37]
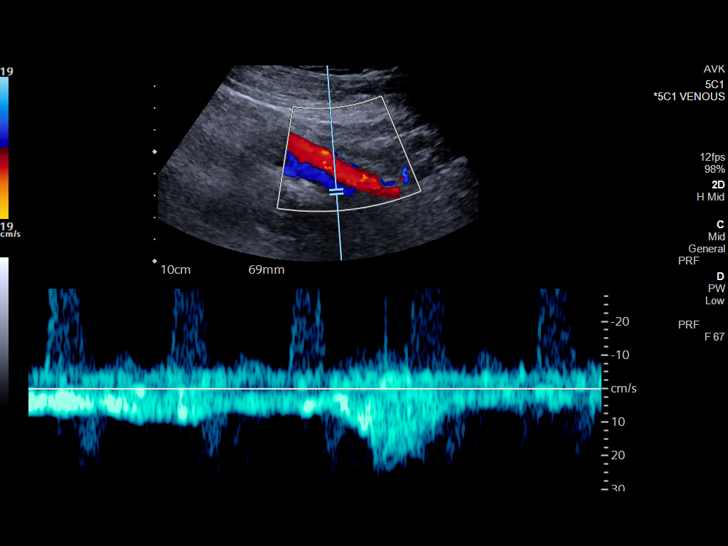
[im 27/37]
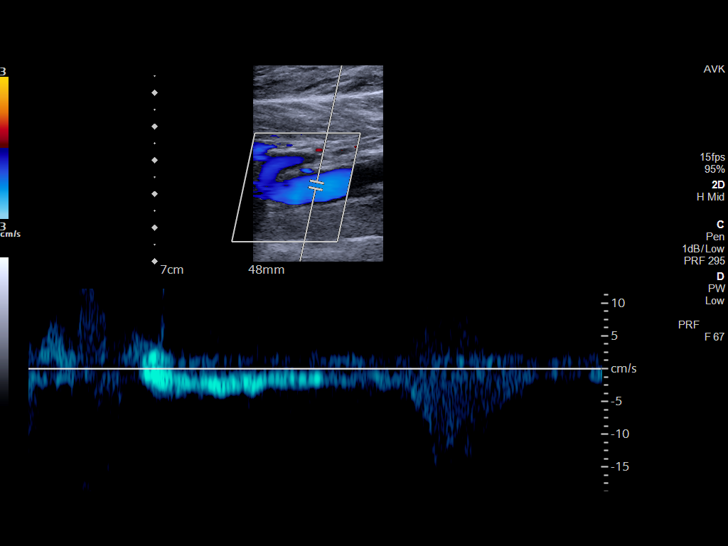
[im 30/37]
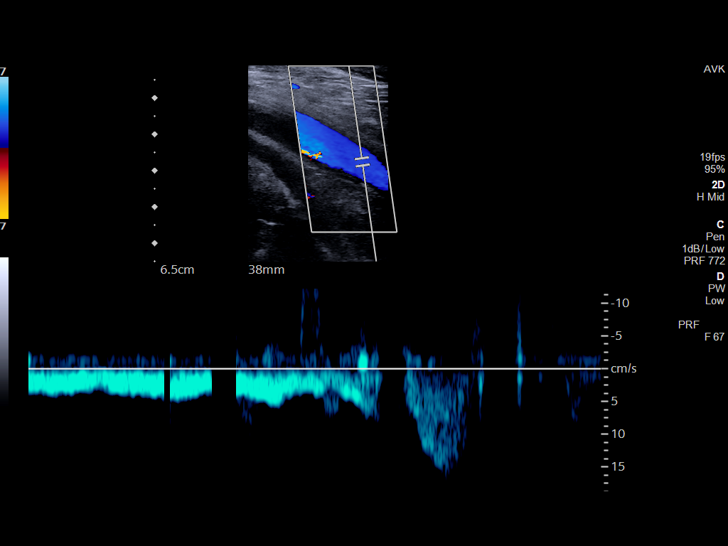
[im 33/37]
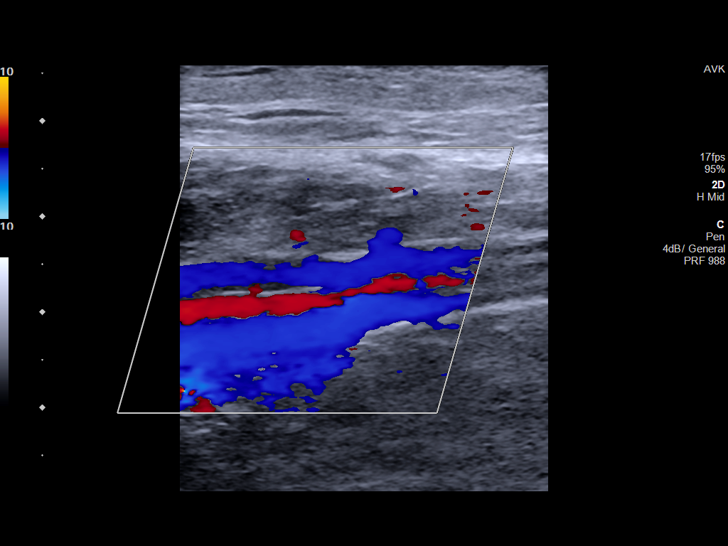
[im 37/37]
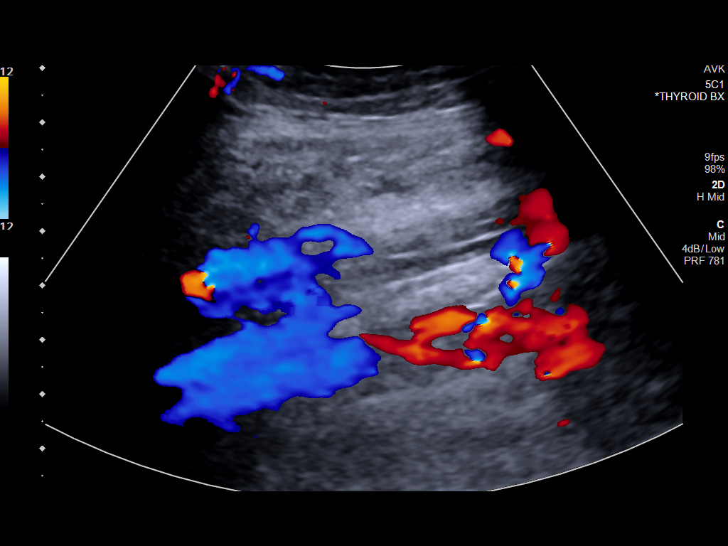

[13 of 24 positions shown; findings below may reference images not displayed]

FINDINGS: Contralateral Common Femoral Vein: Respiratory phasicity is normal
and symmetric with the symptomatic side. No evidence of thrombus.
Normal compressibility.

Common Femoral Vein: No evidence of thrombus. Normal
compressibility, respiratory phasicity and response to augmentation.

Saphenofemoral Junction: No evidence of thrombus. Normal
compressibility and flow on color Doppler imaging.

Profunda Femoral Vein: No evidence of thrombus. Normal
compressibility and flow on color Doppler imaging.

Femoral Vein: No evidence of thrombus. Normal compressibility,
respiratory phasicity and response to augmentation.

Popliteal Vein: No evidence of thrombus. Normal compressibility,
respiratory phasicity and response to augmentation.

Calf Veins: No evidence of thrombus. Normal compressibility and flow
on color Doppler imaging.

Superficial Great Saphenous Vein: No evidence of thrombus. Normal
compressibility.

Venous Reflux:  None.

Other Findings: No evidence of superficial thrombophlebitis or
abnormal fluid collection.
IMPRESSION: No evidence of right lower extremity deep venous thrombosis.

## 2017-12-14 ENCOUNTER — Other Ambulatory Visit: Payer: Self-pay | Admitting: Registered Nurse

## 2017-12-14 NOTE — Telephone Encounter (Signed)
Patient taking 2 tabs daily low potassium after total knee will need to repeat BMP this month also and recheck blood pressure when in clinic for lab draw nonfasting.

## 2018-04-01 DIAGNOSIS — M1712 Unilateral primary osteoarthritis, left knee: Secondary | ICD-10-CM | POA: Insufficient documentation

## 2018-04-25 ENCOUNTER — Other Ambulatory Visit: Payer: Self-pay | Admitting: Registered Nurse

## 2018-06-21 ENCOUNTER — Other Ambulatory Visit: Payer: Self-pay | Admitting: Registered Nurse

## 2018-06-21 NOTE — Telephone Encounter (Signed)
[facility] pt. Request faxed to that clinic.

## 2018-09-26 ENCOUNTER — Other Ambulatory Visit: Payer: Self-pay | Admitting: Internal Medicine

## 2018-10-28 ENCOUNTER — Other Ambulatory Visit: Payer: Self-pay | Admitting: Internal Medicine

## 2018-10-28 DIAGNOSIS — E039 Hypothyroidism, unspecified: Secondary | ICD-10-CM

## 2018-10-28 NOTE — Telephone Encounter (Signed)
Paper chart reviewed with RN Dobbins via telephone TSH Jan 2018 10.220 was on levothyroxine 2-133mcg tabs po qam and Dr Cheryll Cockayne increased to two 164mcg tabs po qam; 23 Mar 2017 TSH 0.305; 17 Dec 2017 TSH 0.654 and Jan 2020 0.095.  Rx Jun 2019 levothyroxine 135mcg 2 tab po qam from Dr Cheryll Cockayne; Dr Roxan Hockey changed Rx to 112 and 193mcg 04/06/2018  Annual labs/visit due Jan 2020 and Dr Roxan Hockey saw patient per paper chart at [facility].  Will bridge refill patient 2 months for levothyroxine 144mcg take 2 tabs po qam #120 RF0 until he can be seen by new PCM since patient symptomatic and for him to discuss repeat TSH at appt with new PCM.  Per RN Raenette Rover patient has history of altering his levothyroxine dosing when he is having hypothyroid symptoms.  Patient no longer eligible for labs/clinic visits at [facility] at this time.

## 2018-10-28 NOTE — Telephone Encounter (Addendum)
Patient was [facility] patient but changed jobs in 2019 to [employer]; waiting for appt with new PCM.  Patient reported he has not been seen since Dr Cheryll Cockayne left [facility] and verified no recent labs per Epic results review/care everywhere.  Case discussed with RN Raenette Rover via telephone prior to patient telephone conversation.  Last TSH 02 Nov 2017 1.670 standing order from Dr Cheryll Cockayne.  Had been on 154mcg levothyroxine take 2 tabs po qam but TSH slightly overcorrected per patient provider changed levothyroxine dosing to take 112+178mcg po qam and patient now feeling like thyroid medication too low e.g. weight gain, tired, unable to concentrate.  Patient would like to return to 188mcg 2 tabs po qam levothyroxine prior to his appt with new PCM scheduled for Oct now as other appt was cancelled August and rescheduled to Oct.  "I have been on levothyroxine for 30 years I know my symptoms well when dose needs adjusting"  Patient needs Rx bridge until he can be seen by new PCM.  Discussed with patient I wanted to review his paper chart/TSH history at [facility] as not all information in Epic and I would call him back if unable to fill levothyroxine 121mcg take 2 tabs po daily #120 RF0 (2 month bridge).  Patient verbalized understanding information/instructions, agreed with plan of care and had no further questions at this time

## 2018-10-28 NOTE — Telephone Encounter (Signed)
Called [facility].  He no longer works for [employer].  States he had an appointment with [facility] that was cancelled.  He has an appointment scheduled for 12/14/2018 at [facility] (hasn't been assigned a doctor yet) to establish new primary care.  He has enough medication to last until the middle of next week.  He just needs enough medication to last until his appointment.  AMD

## 2019-03-18 ENCOUNTER — Other Ambulatory Visit: Payer: Self-pay | Admitting: Internal Medicine

## 2019-03-20 ENCOUNTER — Encounter: Payer: Self-pay | Admitting: Internal Medicine

## 2019-03-20 NOTE — Telephone Encounter (Signed)
Patient no longer eligible for care at [facility] per [employer].  Per epic review new PCM Romualdo Bolk, NP [facility].  Last seen 03/16/2019.  Notified pharmacy no longer under our care and new PCM name.

## 2019-03-23 NOTE — Telephone Encounter (Signed)
noted 

## 2019-09-29 ENCOUNTER — Other Ambulatory Visit: Payer: Self-pay | Admitting: Surgery

## 2019-10-03 ENCOUNTER — Other Ambulatory Visit
Admission: RE | Admit: 2019-10-03 | Discharge: 2019-10-03 | Disposition: A | Discharge status: Home / Self Care | Payer: PRIVATE HEALTH INSURANCE | Source: Ambulatory Visit | Attending: Surgery | Admitting: Surgery

## 2019-10-03 ENCOUNTER — Other Ambulatory Visit: Payer: Self-pay

## 2019-10-03 DIAGNOSIS — Z01812 Encounter for preprocedural laboratory examination: Secondary | ICD-10-CM | POA: Diagnosis present

## 2019-10-03 HISTORY — DX: Sleep apnea, unspecified: G47.30

## 2019-10-03 LAB — SURGICAL PCR SCREEN
MRSA, PCR: NEGATIVE
Staphylococcus aureus: NEGATIVE

## 2019-10-03 LAB — CBC WITH DIFFERENTIAL/PLATELET
Abs Immature Granulocytes: 0.02 10*3/uL (ref 0.00–0.07)
Basophils Absolute: 0 10*3/uL (ref 0.0–0.1)
Basophils Relative: 1 %
Eosinophils Absolute: 0.1 10*3/uL (ref 0.0–0.5)
Eosinophils Relative: 1 %
HCT: 38.9 % — ABNORMAL LOW (ref 39.0–52.0)
Hemoglobin: 13.8 g/dL (ref 13.0–17.0)
Immature Granulocytes: 0 %
Lymphocytes Relative: 22 %
Lymphs Abs: 1.1 10*3/uL (ref 0.7–4.0)
MCH: 30.8 pg (ref 26.0–34.0)
MCHC: 35.5 g/dL (ref 30.0–36.0)
MCV: 86.8 fL (ref 80.0–100.0)
Monocytes Absolute: 0.5 10*3/uL (ref 0.1–1.0)
Monocytes Relative: 10 %
Neutro Abs: 3.4 10*3/uL (ref 1.7–7.7)
Neutrophils Relative %: 66 %
Platelets: 202 10*3/uL (ref 150–400)
RBC: 4.48 MIL/uL (ref 4.22–5.81)
RDW: 13.2 % (ref 11.5–15.5)
WBC: 5.2 10*3/uL (ref 4.0–10.5)
nRBC: 0 % (ref 0.0–0.2)

## 2019-10-03 LAB — URINALYSIS, ROUTINE W REFLEX MICROSCOPIC
Bilirubin Urine: NEGATIVE
Glucose, UA: NEGATIVE mg/dL
Hgb urine dipstick: NEGATIVE
Ketones, ur: NEGATIVE mg/dL
Leukocytes,Ua: NEGATIVE
Nitrite: NEGATIVE
Protein, ur: NEGATIVE mg/dL
Specific Gravity, Urine: 1.003 — ABNORMAL LOW (ref 1.005–1.030)
pH: 7 (ref 5.0–8.0)

## 2019-10-03 LAB — COMPREHENSIVE METABOLIC PANEL
ALT: 23 U/L (ref 0–44)
AST: 22 U/L (ref 15–41)
Albumin: 4.3 g/dL (ref 3.5–5.0)
Alkaline Phosphatase: 74 U/L (ref 38–126)
Anion gap: 8 (ref 5–15)
BUN: 14 mg/dL (ref 6–20)
CO2: 27 mmol/L (ref 22–32)
Calcium: 9.2 mg/dL (ref 8.9–10.3)
Chloride: 104 mmol/L (ref 98–111)
Creatinine, Ser: 1.24 mg/dL (ref 0.61–1.24)
GFR calc Af Amer: >60 mL/min (ref >60)
GFR calc non Af Amer: >60 mL/min (ref >60)
Glucose, Bld: 96 mg/dL (ref 70–99)
Potassium: 3.5 mmol/L (ref 3.5–5.1)
Sodium: 139 mmol/L (ref 135–145)
Total Bilirubin: 0.8 mg/dL (ref 0.3–1.2)
Total Protein: 7.2 g/dL (ref 6.5–8.1)

## 2019-10-03 LAB — TYPE AND SCREEN
ABO/RH(D): O NEG
Antibody Screen: NEGATIVE

## 2019-10-03 NOTE — Patient Instructions (Signed)
Your procedure is scheduled on: Tues 8/10 Report to Day Surgery. To find out your arrival time please call [phone] between 1PM - 3PM on Mon 8/9.  Remember: Instructions that are not followed completely may result in serious medical risk,  up to and including death, or upon the discretion of your surgeon and anesthesiologist your  surgery may need to be rescheduled.     _X__ 1. Do not eat food after midnight the night before your procedure.                 No gum chewing or hard candies. You may drink clear liquids up to 2 hours                 before you are scheduled to arrive for your surgery- DO not drink clear                 liquids within 2 hours of the start of your surgery.                 Clear Liquids include:  water, apple juice without pulp, clear Gatorade, G2 or                  Gatorade Zero (avoid Red/Purple/Blue), Black Coffee or Tea (Do not add                 anything to coffee or tea). __x___2.   Complete the carbohydrate drink provided to you, 2 hours before arrival.  __X__2.  On the morning of surgery brush your teeth with toothpaste and water, you                may rinse your mouth with mouthwash if you wish.  Do not swallow any toothpaste of mouthwash.     _X__ 3.  No Alcohol for 24 hours before or after surgery.   ___ 4.  Do Not Smoke or use e-cigarettes For 24 Hours Prior to Your Surgery.                 Do not use any chewable tobacco products for at least 6 hours prior to                 Surgery.  ___  5.  Do not use any recreational drugs (marijuana, cocaine, heroin, ecstacy, MDMA or other)                For at least one week prior to your surgery.  Combination of these drugs with anesthesia                May have life threatening results.  ____  6.  Bring all medications with you on the day of surgery if instructed.   __x__  7.  Notify your doctor if there is any change in your medical condition      (cold, fever,  infections).     Do not wear jewelry,  Do not wear lotions, You may wear deodorant. Do not shave 48 hours prior to surgery. Men may shave face and neck. Do not bring valuables to the hospital.    [facility] is not responsible for any belongings or valuables.  Contacts, dentures or bridgework may not be worn into surgery. Leave your suitcase in the car. After surgery it may be brought to your room. For patients admitted to the hospital, discharge time is determined by your treatment team.   Patients discharged the day of  surgery will not be allowed to drive home.   Make arrangements for someone to be with you for the first 24 hours of your Same Day Discharge.    Please read over the following fact sheets that you were given:   Incentive spirometer    _x___ Take these medicines the morning of surgery with A SIP OF WATER:    1. diltiazem (CARDIZEM CD) 240 MG 24 hr capsule  2. levothyroxine (SYNTHROID) 137 MCG tablet  3. Tylenol is needed  4.  5.  6.  ____ Fleet Enema (as directed)   _x___ Use CHG Soap (or wipes) as directed  ____ Use Benzoyl Peroxide Gel as instructed  ____ Use inhalers on the day of surgery  ____ Stop metformin 2 days prior to surgery    ____ Take 1/2 of usual insulin dose the night before surgery. No insulin the morning          of surgery.   ____ Stop Coumadin/Plavix/aspirin on   __x__ Stop Anti-inflammatories ibuprofen aleve or aspirin today   May take tylenol   __x__ Stopped  supplements already.    ____ Bring C-Pap to the hospital.

## 2019-10-06 ENCOUNTER — Other Ambulatory Visit
Admission: RE | Admit: 2019-10-06 | Discharge: 2019-10-06 | Disposition: A | Discharge status: Home / Self Care | Payer: PRIVATE HEALTH INSURANCE | Source: Ambulatory Visit | Attending: Surgery | Admitting: Surgery

## 2019-10-06 ENCOUNTER — Other Ambulatory Visit: Payer: Self-pay

## 2019-10-06 DIAGNOSIS — Z01812 Encounter for preprocedural laboratory examination: Secondary | ICD-10-CM | POA: Insufficient documentation

## 2019-10-06 DIAGNOSIS — Z20822 Contact with and (suspected) exposure to covid-19: Secondary | ICD-10-CM | POA: Diagnosis not present

## 2019-10-06 LAB — SARS CORONAVIRUS 2 (TAT 6-24 HRS): SARS Coronavirus 2: NEGATIVE

## 2019-10-10 ENCOUNTER — Ambulatory Visit: Payer: PRIVATE HEALTH INSURANCE | Admitting: Certified Registered Nurse Anesthetist

## 2019-10-10 ENCOUNTER — Ambulatory Visit
Admission: RE | Admit: 2019-10-10 | Discharge: 2019-10-10 | Disposition: A | Discharge status: Home / Self Care | Payer: PRIVATE HEALTH INSURANCE | Attending: Surgery | Admitting: Surgery

## 2019-10-10 ENCOUNTER — Encounter
Admission: RE | Disposition: A | Discharge status: Home / Self Care | Payer: Self-pay | Source: Home / Self Care | Attending: Surgery

## 2019-10-10 ENCOUNTER — Ambulatory Visit: Payer: PRIVATE HEALTH INSURANCE

## 2019-10-10 ENCOUNTER — Encounter: Payer: Self-pay | Admitting: Surgery

## 2019-10-10 DIAGNOSIS — M1712 Unilateral primary osteoarthritis, left knee: Secondary | ICD-10-CM | POA: Insufficient documentation

## 2019-10-10 DIAGNOSIS — I10 Essential (primary) hypertension: Secondary | ICD-10-CM | POA: Insufficient documentation

## 2019-10-10 DIAGNOSIS — G4730 Sleep apnea, unspecified: Secondary | ICD-10-CM | POA: Diagnosis not present

## 2019-10-10 DIAGNOSIS — Z96652 Presence of left artificial knee joint: Secondary | ICD-10-CM

## 2019-10-10 DIAGNOSIS — Z79899 Other long term (current) drug therapy: Secondary | ICD-10-CM | POA: Insufficient documentation

## 2019-10-10 DIAGNOSIS — Z87891 Personal history of nicotine dependence: Secondary | ICD-10-CM | POA: Insufficient documentation

## 2019-10-10 DIAGNOSIS — Z96651 Presence of right artificial knee joint: Secondary | ICD-10-CM | POA: Diagnosis not present

## 2019-10-10 DIAGNOSIS — E039 Hypothyroidism, unspecified: Secondary | ICD-10-CM | POA: Insufficient documentation

## 2019-10-10 HISTORY — PX: TOTAL KNEE ARTHROPLASTY: SHX125

## 2019-10-10 IMAGING — DX DG KNEE 1-2V PORT*L*
2 series · 2 of 2 positions shown · non-contrast
Comparison: None.

CLINICAL DATA: Status post total knee replacement

EXAM:
PORTABLE LEFT KNEE - 1-2 VIEW

[knee ap]
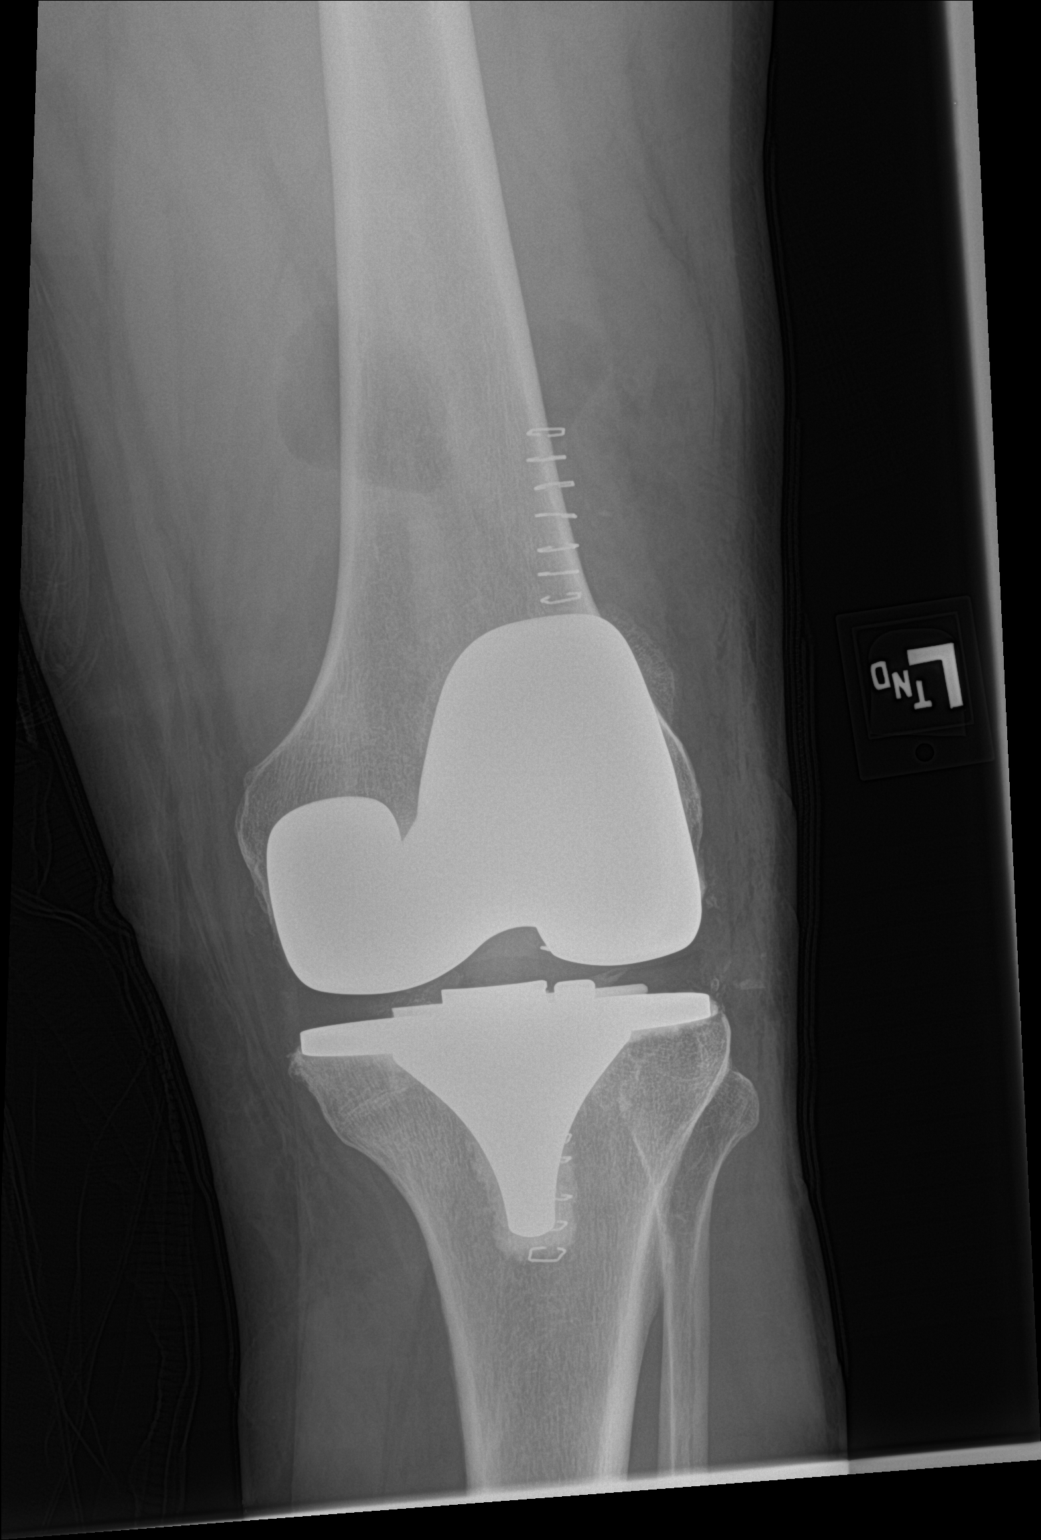

[knee lat]
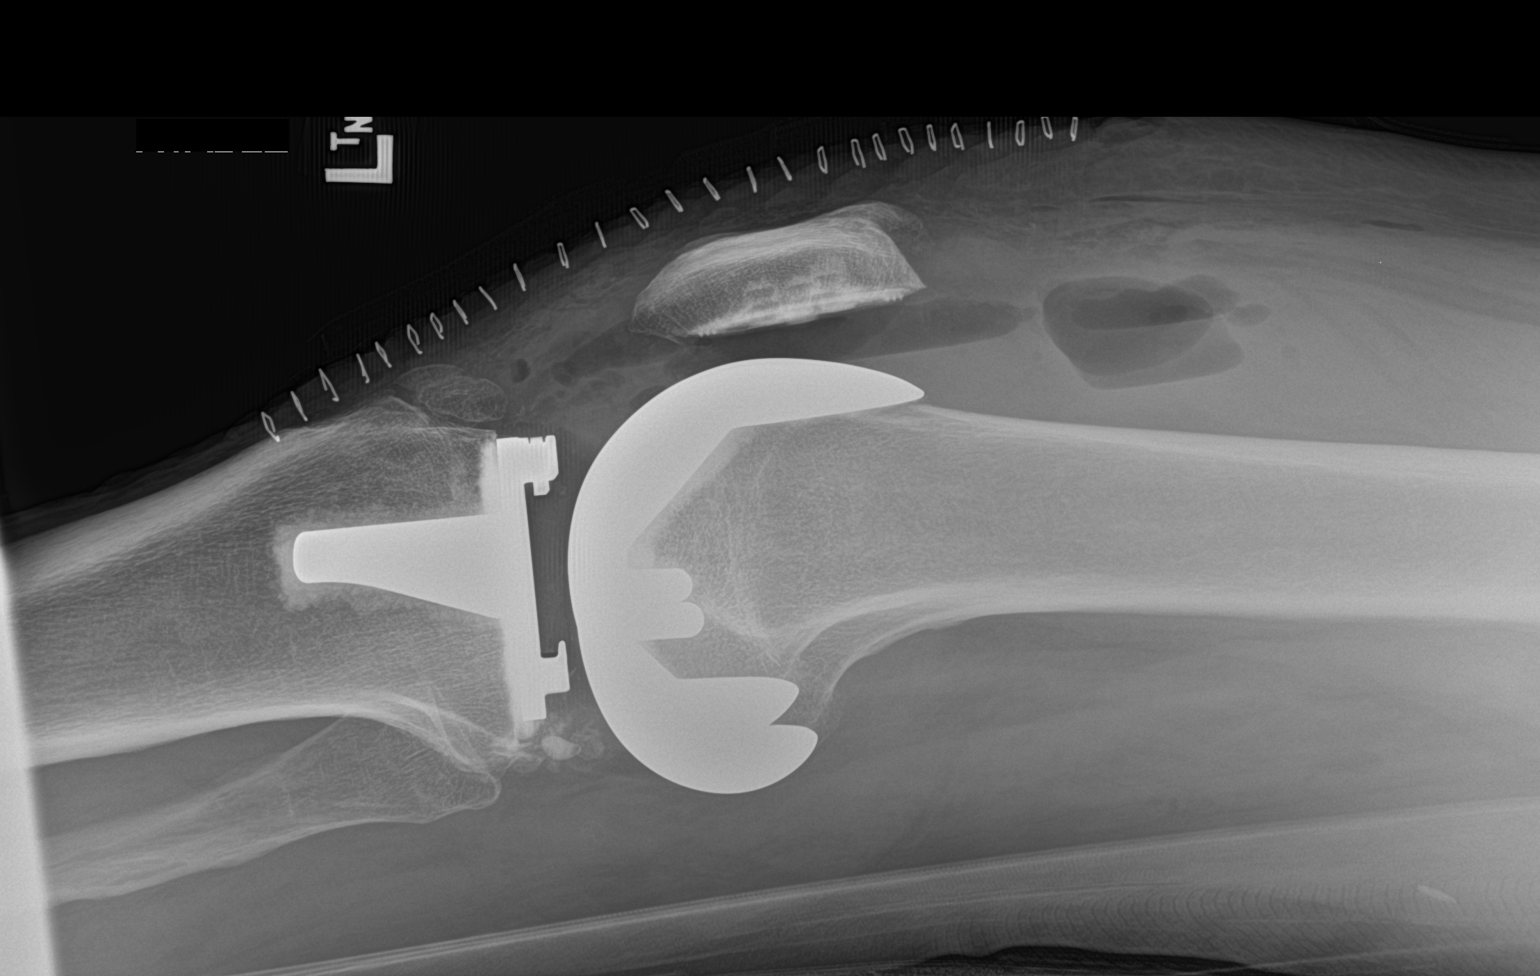

[2 of 2 positions shown; findings below may reference images not displayed]

FINDINGS: Frontal and lateral views were obtained. Patient is status post
total knee replacement with femoral and tibial prosthetic components
well-seated. No fracture or dislocation. No erosive change. Air
within the joint is an expected postoperative finding.
IMPRESSION: Status post total knee replacement with prosthetic components
well-seated. No fracture or dislocation. Acute postoperative changes
noted.

## 2019-10-10 SURGERY — ARTHROPLASTY, KNEE, TOTAL
Anesthesia: Spinal | Site: Knee | Laterality: Left

## 2019-10-10 MED ORDER — CHLORHEXIDINE GLUCONATE 0.12 % MT SOLN
OROMUCOSAL | Status: AC
  Administered 2019-10-10: 15 mL via OROMUCOSAL
  Filled 2019-10-10: qty 15

## 2019-10-10 MED ORDER — SODIUM CHLORIDE FLUSH 0.9 % IV SOLN
INTRAVENOUS | Status: AC
  Filled 2019-10-10: qty 40

## 2019-10-10 MED ORDER — SODIUM CHLORIDE 0.9 % BOLUS PEDS
250.0000 mL | Freq: Once | INTRAVENOUS | Status: AC
  Administered 2019-10-10: 250 mL via INTRAVENOUS

## 2019-10-10 MED ORDER — SODIUM CHLORIDE 0.9 % IV SOLN: INTRAVENOUS | Status: DC

## 2019-10-10 MED ORDER — CLINDAMYCIN PHOSPHATE 600 MG/50ML IV SOLN: 600.0000 mg | Freq: Four times a day (QID) | INTRAVENOUS | Status: DC

## 2019-10-10 MED ORDER — LIDOCAINE HCL (CARDIAC) PF 100 MG/5ML IV SOSY
PREFILLED_SYRINGE | INTRAVENOUS | Status: DC | PRN
  Administered 2019-10-10: 60 mg via INTRAVENOUS

## 2019-10-10 MED ORDER — APIXABAN 2.5 MG PO TABS
2.5000 mg | ORAL_TABLET | Freq: Two times a day (BID) | ORAL | 0 refills | Status: DC
Start: 2019-10-10 — End: 2020-05-30

## 2019-10-10 MED ORDER — MIDAZOLAM HCL 2 MG/2ML IJ SOLN
INTRAMUSCULAR | Status: AC
  Filled 2019-10-10: qty 2

## 2019-10-10 MED ORDER — SODIUM CHLORIDE 0.9 % IV SOLN
INTRAVENOUS | Status: DC | PRN
  Administered 2019-10-10: 90 mL

## 2019-10-10 MED ORDER — DEXAMETHASONE SODIUM PHOSPHATE 10 MG/ML IJ SOLN
INTRAMUSCULAR | Status: AC
  Filled 2019-10-10: qty 1

## 2019-10-10 MED ORDER — LIDOCAINE HCL (PF) 2 % IJ SOLN
INTRAMUSCULAR | Status: AC
  Filled 2019-10-10: qty 5

## 2019-10-10 MED ORDER — CLINDAMYCIN PHOSPHATE 900 MG/50ML IV SOLN
INTRAVENOUS | Status: AC
  Filled 2019-10-10: qty 50

## 2019-10-10 MED ORDER — SODIUM CHLORIDE 0.9 % BOLUS PEDS: 250.0000 mL | Freq: Once | INTRAVENOUS | Status: DC

## 2019-10-10 MED ORDER — ONDANSETRON HCL 4 MG/2ML IJ SOLN
INTRAMUSCULAR | Status: AC
  Filled 2019-10-10: qty 2

## 2019-10-10 MED ORDER — ONDANSETRON HCL 4 MG/2ML IJ SOLN
INTRAMUSCULAR | Status: DC | PRN
  Administered 2019-10-10: 4 mg via INTRAVENOUS

## 2019-10-10 MED ORDER — LACTATED RINGERS IV SOLN: INTRAVENOUS | Status: DC

## 2019-10-10 MED ORDER — KETOROLAC TROMETHAMINE 30 MG/ML IJ SOLN
30.0000 mg | Freq: Once | INTRAMUSCULAR | Status: AC
  Administered 2019-10-10: 30 mg via INTRAVENOUS

## 2019-10-10 MED ORDER — ACETAMINOPHEN 10 MG/ML IV SOLN
INTRAVENOUS | Status: DC | PRN
  Administered 2019-10-10: 1000 mg via INTRAVENOUS

## 2019-10-10 MED ORDER — CEFAZOLIN SODIUM-DEXTROSE 2-4 GM/100ML-% IV SOLN
INTRAVENOUS | Status: AC
  Filled 2019-10-10: qty 100

## 2019-10-10 MED ORDER — BUPIVACAINE HCL (PF) 0.5 % IJ SOLN
INTRAMUSCULAR | Status: DC | PRN
  Administered 2019-10-10: 3 mL via INTRATHECAL

## 2019-10-10 MED ORDER — ACETAMINOPHEN 325 MG PO TABS: 325.0000 mg | ORAL_TABLET | Freq: Four times a day (QID) | ORAL | Status: DC | PRN

## 2019-10-10 MED ORDER — PROPOFOL 10 MG/ML IV BOLUS
INTRAVENOUS | Status: AC
  Filled 2019-10-10: qty 20

## 2019-10-10 MED ORDER — ONDANSETRON HCL 4 MG/2ML IJ SOLN: 4.0000 mg | Freq: Once | INTRAMUSCULAR | Status: DC | PRN

## 2019-10-10 MED ORDER — DEXAMETHASONE SODIUM PHOSPHATE 10 MG/ML IJ SOLN
INTRAMUSCULAR | Status: DC | PRN
  Administered 2019-10-10: 10 mg via INTRAVENOUS

## 2019-10-10 MED ORDER — ACETAMINOPHEN 500 MG PO TABS: 1000.0000 mg | ORAL_TABLET | Freq: Four times a day (QID) | ORAL | Status: DC

## 2019-10-10 MED ORDER — METOCLOPRAMIDE HCL 5 MG/ML IJ SOLN: 5.0000 mg | Freq: Three times a day (TID) | INTRAMUSCULAR | Status: DC | PRN

## 2019-10-10 MED ORDER — PROPOFOL 500 MG/50ML IV EMUL
INTRAVENOUS | Status: AC
  Filled 2019-10-10: qty 50

## 2019-10-10 MED ORDER — METOCLOPRAMIDE HCL 10 MG PO TABS: 5.0000 mg | ORAL_TABLET | Freq: Three times a day (TID) | ORAL | Status: DC | PRN

## 2019-10-10 MED ORDER — PROPOFOL 500 MG/50ML IV EMUL
INTRAVENOUS | Status: DC | PRN
  Administered 2019-10-10: 75 ug/kg/min via INTRAVENOUS

## 2019-10-10 MED ORDER — PROPOFOL 10 MG/ML IV BOLUS
INTRAVENOUS | Status: DC | PRN
  Administered 2019-10-10 (×3): 27 mg via INTRAVENOUS
  Administered 2019-10-10: 40 mg via INTRAVENOUS
  Administered 2019-10-10: 20 mg via INTRAVENOUS
  Administered 2019-10-10: 30 mg via INTRAVENOUS

## 2019-10-10 MED ORDER — ACETAMINOPHEN 10 MG/ML IV SOLN
INTRAVENOUS | Status: AC
  Filled 2019-10-10: qty 100

## 2019-10-10 MED ORDER — ONDANSETRON HCL 4 MG PO TABS: 4.0000 mg | ORAL_TABLET | Freq: Four times a day (QID) | ORAL | Status: DC | PRN

## 2019-10-10 MED ORDER — ORAL CARE MOUTH RINSE: 15.0000 mL | Freq: Once | OROMUCOSAL | Status: AC

## 2019-10-10 MED ORDER — OXYCODONE HCL 5 MG PO TABS: 5.0000 mg | ORAL_TABLET | ORAL | 0 refills | Status: DC | PRN

## 2019-10-10 MED ORDER — FENTANYL CITRATE (PF) 100 MCG/2ML IJ SOLN: 25.0000 ug | INTRAMUSCULAR | Status: DC | PRN

## 2019-10-10 MED ORDER — CEFAZOLIN SODIUM-DEXTROSE 2-3 GM-%(50ML) IV SOLR
INTRAVENOUS | Status: DC | PRN
Start: 2019-10-10 — End: 2019-10-10
  Administered 2019-10-10: 2 g via INTRAVENOUS

## 2019-10-10 MED ORDER — TRANEXAMIC ACID 1000 MG/10ML IV SOLN
INTRAVENOUS | Status: AC
  Filled 2019-10-10: qty 10

## 2019-10-10 MED ORDER — BUPIVACAINE-EPINEPHRINE (PF) 0.5% -1:200000 IJ SOLN
INTRAMUSCULAR | Status: AC
  Filled 2019-10-10: qty 30

## 2019-10-10 MED ORDER — DEXMEDETOMIDINE (PRECEDEX) IN NS 20 MCG/5ML (4 MCG/ML) IV SYRINGE
PREFILLED_SYRINGE | INTRAVENOUS | Status: DC | PRN
  Administered 2019-10-10 (×2): 8 ug via INTRAVENOUS

## 2019-10-10 MED ORDER — MIDAZOLAM HCL 2 MG/2ML IJ SOLN
INTRAMUSCULAR | Status: DC | PRN
  Administered 2019-10-10 (×2): 2 mg via INTRAVENOUS

## 2019-10-10 MED ORDER — OXYCODONE HCL 5 MG PO TABS: 5.0000 mg | ORAL_TABLET | ORAL | Status: DC | PRN

## 2019-10-10 MED ORDER — CHLORHEXIDINE GLUCONATE 0.12 % MT SOLN: 15.0000 mL | Freq: Once | OROMUCOSAL | Status: AC

## 2019-10-10 MED ORDER — CEFAZOLIN SODIUM-DEXTROSE 2-4 GM/100ML-% IV SOLN
2.0000 g | Freq: Four times a day (QID) | INTRAVENOUS | Status: DC
  Administered 2019-10-10: 2 g via INTRAVENOUS

## 2019-10-10 MED ORDER — ONDANSETRON HCL 4 MG/2ML IJ SOLN: 4.0000 mg | Freq: Four times a day (QID) | INTRAMUSCULAR | Status: DC | PRN

## 2019-10-10 MED ORDER — FAMOTIDINE 20 MG PO TABS
ORAL_TABLET | ORAL | Status: AC
  Administered 2019-10-10: 20 mg via ORAL
  Filled 2019-10-10: qty 1

## 2019-10-10 MED ORDER — TRAMADOL HCL 50 MG PO TABS: 50.0000 mg | ORAL_TABLET | Freq: Four times a day (QID) | ORAL | Status: DC

## 2019-10-10 MED ORDER — FAMOTIDINE 20 MG PO TABS: 20.0000 mg | ORAL_TABLET | Freq: Once | ORAL | Status: AC

## 2019-10-10 MED ORDER — TRAMADOL HCL 50 MG PO TABS
ORAL_TABLET | ORAL | Status: AC
  Administered 2019-10-10: 50 mg
  Filled 2019-10-10: qty 1

## 2019-10-10 MED ORDER — CLINDAMYCIN PHOSPHATE 900 MG/50ML IV SOLN: 900.0000 mg | INTRAVENOUS | Status: DC

## 2019-10-10 MED ORDER — KETOROLAC TROMETHAMINE 30 MG/ML IJ SOLN
INTRAMUSCULAR | Status: AC
  Filled 2019-10-10: qty 1

## 2019-10-10 MED ORDER — BUPIVACAINE LIPOSOME 1.3 % IJ SUSP
INTRAMUSCULAR | Status: AC
  Filled 2019-10-10: qty 20

## 2019-10-10 MED ORDER — LIDOCAINE HCL 1 % IJ SOLN
INTRAMUSCULAR | Status: DC | PRN
  Administered 2019-10-10 (×2): 3 mL

## 2019-10-10 MED ORDER — TRANEXAMIC ACID 1000 MG/10ML IV SOLN
INTRAVENOUS | Status: DC | PRN
  Administered 2019-10-10: 1000 mg via TOPICAL

## 2019-10-10 SURGICAL SUPPLY — 63 items
BEARING TIBIAL KNEE AS 10M 79M (Knees) ×3 IMPLANT
BLADE SAW SAG 25X90X1.19 (BLADE) ×3 IMPLANT
BLADE SURG SZ20 CARB STEEL (BLADE) ×3 IMPLANT
BNDG ELASTIC 6X5.8 VLCR NS LF (GAUZE/BANDAGES/DRESSINGS) ×3 IMPLANT
CANISTER SUCT 1200ML W/VALVE (MISCELLANEOUS) ×3 IMPLANT
CANISTER SUCT 3000ML PPV (MISCELLANEOUS) ×6 IMPLANT
CEMENT BONE R 1X40 (Cement) ×6 IMPLANT
CEMENT VACUUM MIXING SYSTEM (MISCELLANEOUS) ×3 IMPLANT
CHLORAPREP W/TINT 26 (MISCELLANEOUS) ×6 IMPLANT
COOLER POLAR GLACIER W/PUMP (MISCELLANEOUS) ×3 IMPLANT
COVER MAYO STAND REUSABLE (DRAPES) ×3 IMPLANT
COVER WAND RF STERILE (DRAPES) ×3 IMPLANT
CUFF TOURN SGL QUICK 24 (TOURNIQUET CUFF) ×2
CUFF TOURN SGL QUICK 30 (TOURNIQUET CUFF)
CUFF TRNQT CYL 24X4X16.5-23 (TOURNIQUET CUFF) ×1 IMPLANT
CUFF TRNQT CYL 30X4X21-28X (TOURNIQUET CUFF) IMPLANT
DRAPE 3/4 80X56 (DRAPES) ×3 IMPLANT
DRAPE IMP U-DRAPE 54X76 (DRAPES) ×3 IMPLANT
DRSG OPSITE POSTOP 4X10 (GAUZE/BANDAGES/DRESSINGS) ×3 IMPLANT
DRSG OPSITE POSTOP 4X8 (GAUZE/BANDAGES/DRESSINGS) ×3 IMPLANT
ELECT CAUTERY BLADE 6.4 (BLADE) ×3 IMPLANT
ELECT REM PT RETURN 9FT ADLT (ELECTROSURGICAL) ×3
ELECTRODE REM PT RTRN 9FT ADLT (ELECTROSURGICAL) ×1 IMPLANT
FEMORAL CR LEFT 75 (Joint) ×3 IMPLANT
GLOVE BIO SURGEON STRL SZ7.5 (GLOVE) ×12 IMPLANT
GLOVE BIO SURGEON STRL SZ8 (GLOVE) ×12 IMPLANT
GLOVE BIOGEL PI IND STRL 8 (GLOVE) ×1 IMPLANT
GLOVE BIOGEL PI INDICATOR 8 (GLOVE) ×2
GLOVE INDICATOR 8.0 STRL GRN (GLOVE) ×3 IMPLANT
GOWN STRL REUS W/ TWL LRG LVL3 (GOWN DISPOSABLE) ×1 IMPLANT
GOWN STRL REUS W/ TWL XL LVL3 (GOWN DISPOSABLE) ×1 IMPLANT
GOWN STRL REUS W/TWL LRG LVL3 (GOWN DISPOSABLE) ×2
GOWN STRL REUS W/TWL XL LVL3 (GOWN DISPOSABLE) ×2
HOLDER FOLEY CATH W/STRAP (MISCELLANEOUS) IMPLANT
HOOD PEEL AWAY FLYTE STAYCOOL (MISCELLANEOUS) ×12 IMPLANT
KIT TURNOVER KIT A (KITS) ×3 IMPLANT
NDL SAFETY ECLIPSE 18X1.5 (NEEDLE) ×2 IMPLANT
NEEDLE HYPO 18GX1.5 SHARP (NEEDLE) ×4
NEEDLE SPNL 20GX3.5 QUINCKE YW (NEEDLE) ×3 IMPLANT
NS IRRIG 1000ML POUR BTL (IV SOLUTION) ×3 IMPLANT
PACK TOTAL KNEE (MISCELLANEOUS) ×3 IMPLANT
PAD WRAPON POLAR KNEE (MISCELLANEOUS) ×1 IMPLANT
PATELLA STAND VANGUARD 40X10 (Joint) ×3 IMPLANT
PATELLA STD VANGUARD 40X10 (Joint) ×1 IMPLANT
PENCIL SMOKE EVACUATOR (MISCELLANEOUS) ×3 IMPLANT
PLATE KNEE TIBIAL 79MM FIXED (Plate) ×3 IMPLANT
PULSAVAC PLUS IRRIG FAN TIP (DISPOSABLE) ×3
Piston Syringe ×3 IMPLANT
SOL .9 NS 3000ML IRR  AL (IV SOLUTION) ×2
SOL .9 NS 3000ML IRR UROMATIC (IV SOLUTION) ×1 IMPLANT
STAPLER SKIN PROX 35W (STAPLE) ×3 IMPLANT
SUCTION FRAZIER HANDLE 10FR (MISCELLANEOUS) ×2
SUCTION TUBE FRAZIER 10FR DISP (MISCELLANEOUS) ×1 IMPLANT
SUT VIC AB 0 CT1 36 (SUTURE) ×9 IMPLANT
SUT VIC AB 2-0 CT1 27 (SUTURE) ×8
SUT VIC AB 2-0 CT1 TAPERPNT 27 (SUTURE) ×4 IMPLANT
SYR 10ML LL (SYRINGE) ×3 IMPLANT
SYR 20ML LL LF (SYRINGE) ×3 IMPLANT
SYR 30ML LL (SYRINGE) ×9 IMPLANT
SYR BULB IRRIG 60ML STRL (SYRINGE) ×3 IMPLANT
TIP FAN IRRIG PULSAVAC PLUS (DISPOSABLE) ×1 IMPLANT
TRAY FOLEY MTR SLVR 16FR STAT (SET/KITS/TRAYS/PACK) IMPLANT
WRAPON POLAR PAD KNEE (MISCELLANEOUS) ×3

## 2019-10-10 NOTE — Progress Notes (Signed)
PT Cancellation Note  Patient Details Name: Tom Barrett MRN: [id number] DOB: 30-Jul-1960   Cancelled Treatment:    Reason Eval/Treat Not Completed: Patient not medically ready Spoke with post-op nurse ~16:45 who reports that pt's spinal is still not worn off, unable to bend knee back, etc. Pt did very well with contralateral knee ~2 years ago and is familiar with post-op PT, has all DME, etc.  This PT saw him at that time and he did, indeed, do very well on POD0; walking ~100 ft and showing very good ROM and strength.  Nursing apparently spoke with surgeon who is also confident that pt is familiar with post-op expectations and acknowledges that as time goes on PT may not be able to see him today, but given his history and performance last time will able to mobilize enough with nursing and if he does so that he may be able to go home.    Kreg Shropshire , DPT 10/10/2019, 4:44 PM

## 2019-10-10 NOTE — Progress Notes (Signed)
Patients bladder scanned for 866cc

## 2019-10-10 NOTE — OR Nursing (Addendum)
Dr. Roland Rack by to see pt in postop 1555.  Aware patient hasn't had PT yet as spinal effects continue.  Advises to keep him updated re progress.

## 2019-10-10 NOTE — OR Nursing (Addendum)
PT Galan in for eval; patient ambulatory with walker; tolerated well.  Dr. Roland Rack in and aware of same.  Discharge pending patient void.  TED stkg applied to op leg as instructed by MD to RN in postop.  MD advises waterproof drsg under wrap; pt instructed to remove wrap from leg prior to shower, then reapply after shower.

## 2019-10-10 NOTE — Evaluation (Signed)
Physical Therapy Evaluation Patient Details Name: Tom Barrett MRN: [id number] DOB: 1960-06-27 Today's Date: 10/10/2019   History of Present Illness  59 y/o male s/p L TKA 8/10, h/o R TKA 10/2017 with good results.   Clinical Impression  Pt did well, still having some numbness/coordination issues with L LE 2/2 spinal, but showed solid quad set and pure strength, however with WBing he did need to be more deliberate and actively engage quads to achieve TKE, with increased cuing and ~150 ft of gait training this did improve - no safety issues or LOBs t/o the effort with considerable reliance on the walker.  Reviewed appropriate strategy for negotiating steps to enter home this evening (assuming he d/c's as plannned), pt and wife with good understanding.  Issued and reviewed HEP, good effort, good safety pt able to safely d/c home per medical clearance.      Follow Up Recommendations Follow surgeon's recommendation for DC plan and follow-up therapies    Equipment Recommendations  None recommended by PT    Recommendations for Other Services       Precautions / Restrictions Precautions Precautions: Knee;Fall Restrictions Weight Bearing Restrictions: Yes LLE Weight Bearing: Weight bearing as tolerated      Mobility  Bed Mobility Overal bed mobility: Modified Independent             General bed mobility comments: Pt able to get to EOB w/ ease, no assist needed  Transfers Overall transfer level: Modified independent Equipment used: Rolling walker (2 wheeled)             General transfer comment: cues for appropriate UE and AD use, able to transition to standing w/o assist, did need to be very deliberate to get L knee full extended  Ambulation/Gait Ambulation/Gait assistance: Supervision Gait Distance (Feet): 150 Feet Assistive device: Rolling walker (2 wheeled)       General Gait Details: Pt initially with some hesitancy and difficulty coordinating L swing through/heel  strike secondary but with increased time/distance/cuing was able to improve cadence appropriately  Stairs Stairs:  (verbal reminders for appropraite strategy (recalled from R))          Wheelchair Mobility    Modified Rankin (Stroke Patients Only)       Balance Overall balance assessment: Modified Independent                                           Pertinent Vitals/Pain Pain Assessment: 0-10 Pain Score: 6  Pain Location: still having some numbness in [place] expects to be discharged to:: Private residence Living Arrangements: Spouse/significant other Available Help at Discharge: Available 24 hours/day   Home Access: Stairs to enter Entrance Stairs-Rails: Left;Right (wide) Entrance Stairs-Number of Steps: 6   Home Equipment: Walker - 2 wheels;Bedside commode      Prior Function Level of Independence: Independent               Hand Dominance        Extremity/Trunk Assessment   Upper Extremity Assessment Upper Extremity Assessment: Overall WFL for tasks assessed    Lower Extremity Assessment Lower Extremity Assessment: Overall WFL for tasks assessed (expected post-op weakness, good quad set, min numbness)       Communication   Communication: No difficulties  Cognition Arousal/Alertness: Awake/alert Behavior During Therapy: WFL for tasks assessed/performed Overall Cognitive Status: Within  Functional Limits for tasks assessed                                        General Comments      Exercises Total Joint Exercises Ankle Circles/Pumps: AROM;10 reps Quad Sets: Strengthening;10 reps Heel Slides: AROM;5 reps Hip ABduction/ADduction: Strengthening;10 reps Straight Leg Raises: AROM;10 reps Knee Flexion: PROM Goniometric ROM: 0-92   Assessment/Plan    PT Assessment Patient needs continued PT services  PT Problem List Decreased strength;Decreased range of motion;Decreased  activity tolerance;Decreased balance;Decreased mobility;Decreased coordination;Decreased knowledge of use of DME;Decreased safety awareness;Pain       PT Treatment Interventions DME instruction;Gait training;Stair training;Functional mobility training;Therapeutic activities;Therapeutic exercise;Balance training;Neuromuscular re-education;Patient/family education    PT Goals (Current goals can be found in the Care Plan section)  Acute Rehab PT Goals Patient Stated Goal: go home PT Goal Formulation: With patient Time For Goal Achievement: 10/24/19 Potential to Achieve Goals: Good    Frequency BID   Barriers to discharge        Co-evaluation               AM-PAC PT "6 Clicks" Mobility  Outcome Measure Help needed turning from your back to your side while in a flat bed without using bedrails?: None Help needed moving from lying on your back to sitting on the side of a flat bed without using bedrails?: None Help needed moving to and from a bed to a chair (including a wheelchair)?: None Help needed standing up from a chair using your arms (e.g., wheelchair or bedside chair)?: None Help needed to walk in hospital room?: None Help needed climbing 3-5 steps with a railing? : A Little 6 Click Score: 23    End of Session Equipment Utilized During Treatment: Gait belt Activity Tolerance: Patient tolerated treatment well Patient left: in chair;with family/visitor present;with nursing/sitter in room Nurse Communication: Mobility status PT Visit Diagnosis: Muscle weakness (generalized) (M62.81);Difficulty in walking, not elsewhere classified (R26.2);Pain Pain - Right/Left: Left Pain - part of body: Knee    Time: 6967-8938 PT Time Calculation (min) (ACUTE ONLY): 21 min   Charges:   PT Evaluation $PT Eval Low Complexity: 1 Low PT Treatments $Gait Training: 8-22 mins        Kreg Shropshire, DPT 10/10/2019, 5:59 PM

## 2019-10-10 NOTE — Op Note (Signed)
10/10/2019  1:22 PM  Patient:   Tom Barrett  Pre-Op Diagnosis:   Degenerative joint disease, left knee.  Post-Op Diagnosis:   Same  Procedure:   Left TKA using all-cemented Biomet Vanguard system with a 75 mm PCR femur, a 79 mm tibial tray with a 10 mm anterior stabilized E-poly insert, and a 40 x 10.5 mm all-poly 3-pegged domed patella.  Surgeon:   Pascal Lux, MD  Assistant:   Cameron Proud, PA-C; Gabrielle Dare, PA-S  Anesthesia:   Spinal  Findings:   As above  Complications:   None  EBL:   10 cc  Fluids:   800 cc crystalloid  UOP:   None  TT:   110 minutes at 300 mmHg  Drains:   None  Closure:   Staples  Implants:   As above  Brief Clinical Note:   The patient is a 59 year old male with a long history of progressively worsening left knee pain. The patient's symptoms have progressed despite medications, activity modification, injections, etc. The patient's history and examination were consistent with advanced degenerative joint disease of the left knee confirmed by plain radiographs. The patient presents at this time for a left total knee arthroplasty.  Procedure:   The patient was brought into the operating room. After adequate spinal anesthesia was obtained, the patient was lain in the supine position. The left lower extremity was prepped with ChloraPrep solution and draped sterilely. Preoperative antibiotics were administered. After verifying the proper laterality with a surgical timeout, the limb was exsanguinated with an Esmarch and the tourniquet inflated to 300 mmHg. A standard anterior approach to the knee was made through an approximately 7 inch incision. The incision was carried down through the subcutaneous tissues to expose superficial retinaculum. This was split the length of the incision and the medial flap elevated sufficiently to expose the medial retinaculum. The medial retinaculum was incised, leaving a 3-4 mm cuff of tissue on the patella. This was  extended distally along the medial border of the patellar tendon and proximally through the medial third of the quadriceps tendon. A subtotal fat pad excision was performed before the soft tissues were elevated off the anteromedial and anterolateral aspects of the proximal tibia to the level of the collateral ligaments. The anterior portions of the medial and lateral menisci were removed, as was the anterior cruciate ligament. With the knee flexed to 90, the external tibial guide was positioned and the appropriate proximal tibial cut made. This piece was taken to the back table where it was measured and found to be optimally replicated by a 79 mm component.  Attention was directed to the distal femur. The intramedullary canal was accessed through a 3/8" drill hole. The intramedullary guide was inserted and positioned in order to obtain a neutral flexion gap. The intercondylar block was positioned with care taken to avoid notching the anterior cortex of the femur. The appropriate cut was made. Next, the distal cutting block was placed at 5 of valgus alignment. Using the 9 mm slot, the distal cut was made. The distal femur was measured and found to be optimally replicated by the 75 mm component. The 75 mm 4-in-1 cutting block was positioned and first the posterior, then the posterior chamfer, the anterior chamfer, and finally the anterior cuts were made. At this point, the posterior portions medial and lateral menisci were removed. A trial reduction was performed using the appropriate femoral and tibial components with the 10 mm insert. This demonstrated excellent stability  to varus and valgus stressing both in flexion and extension while permitting full extension. Patella tracking was assessed and found to be excellent. Therefore, the tibial guide position was marked on the proximal tibia. The patella thickness was measured and found to be 28 mm. Therefore, the appropriate cut was made. The patellar surface was  measured and found to be optimally replicated by the 40 mm component. The three peg holes were drilled in place before the trial button was inserted. Patella tracking was assessed and found to be excellent, passing the "no thumb test". The lug holes were drilled into the distal femur before the trial component was removed, leaving only the tibial tray. The keel was then created using the appropriate tower, reamer, and punch.  The bony surfaces were prepared for cementing by irrigating them thoroughly with bacitracin saline solution via the jet lavage system. A bone plug was fashioned from some of the bone that had been removed previously and used to plug the distal femoral canal. In addition, 20 cc of Exparel diluted out to 60 cc with normal saline and 30 cc of 0.5% Sensorcaine were injected into the postero-medial and postero-lateral aspects of the knee, the medial and lateral gutter regions, and the peri-incisional tissues to help with postoperative analgesia. Meanwhile, the cement was being mixed on the back table. When it was ready, the tibial tray was cemented in first. The excess cement was removed using [employer] fast streamer. Next, the femoral component was impacted into place. Again, the excess cement was removed using [employer] fast streamer. The 10 mm trial insert was positioned and the knee brought into extension while the cement hardened. Finally, the patella was cemented into place and secured using the patellar clamp. Again, the excess cement was removed using [employer] fast streamer. Once the cement had hardened, the knee was placed through a range of motion with the findings as described above. Therefore, the trial insert was removed and, after verifying that no cement had been retained posteriorly, the permanent 10 mm anterior stabilized E-polyethylene insert was positioned and secured using the appropriate key locking mechanism. Again the knee was placed through a range of motion with the findings as described  above.  The wound was copiously irrigated with sterile saline solution using the jet lavage system before the quadriceps tendon and retinacular layer were reapproximated using #0 Vicryl interrupted sutures. The superficial retinacular layer also was closed using a running #0 Vicryl suture. A total of 10 cc of transexemic acid (TXA) was injected intra-articularly before the subcutaneous tissues were closed in several layers using 2-0 Vicryl interrupted sutures. The skin was closed using staples. A sterile honeycomb dressing was applied to the skin before the leg was wrapped with an Ace wrap to accommodate the Polar Care device. The patient was then awakened and returned to the recovery room in satisfactory condition after tolerating the procedure well.

## 2019-10-10 NOTE — Anesthesia Procedure Notes (Signed)
Spinal  Patient location during procedure: OR Start time: 10/10/2019 10:36 AM End time: 10/10/2019 10:46 AM Staffing Performed: anesthesiologist  Anesthesiologist: Arita Miss, MD Resident/CRNA: Eben Burow, CRNA Preanesthetic Checklist Completed: patient identified, IV checked, site marked, risks and benefits discussed, surgical consent, monitors and equipment checked and pre-op evaluation Spinal Block Patient position: sitting Prep: ChloraPrep and site prepped and draped Patient monitoring: heart rate, continuous pulse ox and blood pressure Approach: midline Location: L3-4 Injection technique: single-shot Needle Needle type: Quincke  Needle gauge: 22 G Needle length: 9 cm Additional Notes Spinal tray expiration 05/2020.  One unsuccessful attempt by CRNA, one unsuccessful attempt by MDA followed by successful attempt by same MDA with + CSF return. No paresthesias on injection. Patient tolerated procedure well.

## 2019-10-10 NOTE — H&P (Signed)
History of Present Illness:  Tom Barrett is a 59 y.o. male that presents to clinic today for his preoperative history and evaluation. The patient is scheduled to undergo a left total knee arthroplasty on 10/10/19 by Dr. Roland Rack. His pain began several years ago. The pain is located primarily along the medial aspect of the knee. He describes his pain as worse with prolonged standing or weightbearing. He reports associated "shifting" and giving way of the knee. He denies associated numbness or tingling. The patient's symptoms have progressed to the point that they decrease his quality of life. The patient has previously undergone conservative treatment including NSAIDS and injections to the knee without adequate control of his symptoms. He denies any significant cardiac history, history of blood clots, or history of lumbar surgery.   Past Medical History:   Anxiety   Back pain   Dizziness   Edema   Elevated PSA   Fatigue   Heberden's nodes   History of anemia   HTN (hypertension)   Hyperlipidemia   Hypokalemia   Hypothyroidism   Osteoarthritis   Sleep apnea   Tubular adenoma of colon, unspecified 10/29/2014   Past Surgical History:   COLONOSCOPY 10/29/2014  Tubular adenoma of colon/Repeat 70yrs/MGR   Right total knee arthroplasty using all cemented biomet vanguard system with a 75 mm PCR femur a 77 mm tibial tray with a 10 mm AS E-poly insert and a 37 x 10 mm all poly 3 pegged domed patella Right 11/09/2017  Dr.Shepard Keltz   Current Medications:   hydroCHLOROthiazide (MICROZIDE) 12.5 mg capsule Take by mouth   diltiazem (CARDIZEM CD) 240 MG CD capsule Take 240 mg by mouth once daily.   doxazosin (CARDURA) 8 MG tablet Take 8 mg by mouth nightly.   hydrocortisone (ANUSOL-HC) 2.5 % rectal cream Apply topically 3 (three) times daily as needed for Hemorrhoids   ibuprofen (ADVIL,MOTRIN) 200 MG tablet Take 400 mg by mouth every 6 (six) hours as needed   levothyroxine (SYNTHROID,  LEVOTHROID) 137 MCG tablet Take 137 mcg by mouth once daily Take on an empty stomach with a glass of water at least 30-60 minutes before breakfast.   potassium chloride (MICRO-K) 10 MEQ ER capsule Take 10 mEq by mouth every other day   quinapril (ACCUPRIL) 40 MG tablet Take 40 mg by mouth 2 (two) times daily.   No current facility-administered medications for this visit.   Allergies:   Amlodipine Other (See Comments)   Metoprolol Other (See Comments)   Penicillin Unknown   Social History:   Socioeconomic History   Marital status: Married  Spouse name: Not on file   Number of children: Not on file   Years of education: Not on file   Highest education level: Not on file  Occupational History   Not on file  Tobacco Use   Smoking status: Former Smoker   Smokeless tobacco: Never Used  Scientific laboratory technician Use: Never used  Substance and Sexual Activity   Alcohol use: Never   Drug use: Never   Sexual activity: Not on file  Other Topics Concern   Not on file  Social History Narrative   Not on file   Social Determinants of Health   Financial Resource Strain:   Difficulty of Paying Living Expenses:  Food Insecurity:   Worried About Charity fundraiser in the Last Year:   Arboriculturist in the Last Year:  Transportation Needs:   Lack of Transportation (Medical):   Lack of  Transportation (Non-Medical):  Physical Activity:   Days of Exercise per Week:   Minutes of Exercise per Session:  Stress:   Feeling of Stress :  Social Connections:   Frequency of Communication with Friends and Family:   Frequency of Social Gatherings with Friends and Family:   Attends Religious Services:   Active Member of Clubs or Organizations:   Attends Music therapist:   Marital Status:   Family History:   No Known Problems Mother   Review of Systems:  A 10+ ROS was performed, reviewed, and the pertinent orthopaedic findings are documented in the  HPI.   Physical Examination:  BP (!) 140/92   Ht 180.3 cm (5\' 11" )   Wt (!) 107.9 kg (237 lb 12.8 oz)   BMI 33.17 kg/m  Patient is a well-developed, well-nourished male in no acute distress. Patient has normal mood and affect. Patient is alert and oriented to person, place, and time.  HEENT: Atraumatic, normocephalic. Pupils equal and reactive to light. Extraocular motion intact. Noninjected sclera. Cardiovascular: Regular rate and rhythm, with no murmurs, rubs, or gallops. Distal pulses palpable. Respiratory: Lungs clear to auscultation bilaterally.   Leftknee exam: GAIT:Ambulates with a mild limp, but uses no assistive devices. ALIGNMENT:Moderate varus SKIN:Unremarkable SWELLING:Minimal EFFUSION:Small WARMTH:None TENDERNESS:Minimal tendernessalong medial joint line ROM:0-105 degrees with minimal discomfort in maximal flexion McMURRAY'S:Equivocally positive PATELLOFEMORAL:Normal tracking with no peri-patellar tenderness and negative apprehension sign CREPITUS:None LACHMAN'S:Negative PIVOT SHIFT:Negative ANTERIOR DRAWER:Negative POSTERIOR DRAWER:Negative VARUS/VALGUS:Mildly positive pseudolaxity to varus stressing  Sensation intact over the saphenous and deep fibular nerve distributions, intact but decreased to pinprick over the lateral sural cutaneous and superficial fibular distributions.   Tests Performed/Reviewed:  No new radiographs were obtained today. Previous radiographs were reviewed of the left knee and revealed complete loss of medial compartment joint space with bone-on-bone contact and subchondral sclerosis. Lateral and patellofemoral joint spaces show mild loss of joint space. No fractures or dislocations noted.  Impression:  1. Primary osteoarthritis of left knee.   Plan:  The patient has end-stage degenerative changes of the left knee. It was explained to the patient that the condition is progressive in nature. Having failed conservative  treatment, the patient has elected to proceed with a total joint arthroplasty. The patient will undergo a total joint arthroplasty with Dr. Roland Rack. The risks of surgery, including blood clot and infection, were discussed with the patient. Measures to reduce these risks, including the use of anticoagulation, perioperative antibiotics, and early ambulation were discussed. The importance of postoperative physical therapy was discussed with the patient. The patient elects to proceed with surgery.   H&P reviewed and patient re-examined. No changes.

## 2019-10-10 NOTE — Discharge Instructions (Addendum)
Orthopedic discharge instructions: May shower with intact op-site dressing.  Apply ice frequently to knee or use polar care. Take Eliquis 2.5 mg BID for 2 weeks, then start aspirin 325 mg daily for 4 weeks.  BID = twice daily (every 12 hours) Take pain medication as prescribed or ES Tylenol when needed.  May weight-bear as tolerated - use crutches or walker as needed. Start physical therapy tomorrow as scheduled. Follow-up in 10-14 days or as scheduled.  Bupivacaine Liposomal Suspension for Injection What is this medicine? BUPIVACAINE LIPOSOMAL (bue PIV a kane LIP oh som al) is an anesthetic. It causes loss of feeling in the skin or other tissues. It is used to prevent and to treat pain from some procedures. This medicine may be used for other purposes; ask your health care provider or pharmacist if you have questions. COMMON BRAND NAME(S): EXPAREL What should I tell my health care provider before I take this medicine? They need to know if you have any of these conditions:  G6PD deficiency  heart disease  kidney disease  liver disease  low blood pressure  lung or breathing disease, like asthma  an unusual or allergic reaction to bupivacaine, other medicines, foods, dyes, or preservatives  pregnant or trying to get pregnant  breast-feeding How should I use this medicine? This medicine is for injection into the affected area. It is given by a health care professional in a hospital or clinic setting. Talk to your pediatrician regarding the use of this medicine in children. Special care may be needed. Overdosage: If you think you have taken too much of this medicine contact a poison control center or emergency room at once. NOTE: This medicine is only for you. Do not share this medicine with others. What if I miss a dose? This does not apply. What may interact with this medicine? This medicine may interact with the following medications:  acetaminophen  certain antibiotics  like dapsone, nitrofurantoin, aminosalicylic acid, sulfonamides  certain medicines for seizures like phenobarbital, phenytoin, valproic acid  chloroquine  cyclophosphamide  flutamide  hydroxyurea  ifosfamide  metoclopramide  nitric oxide  nitroglycerin  nitroprusside  nitrous oxide  other local anesthetics like lidocaine, pramoxine, tetracaine  primaquine  quinine  rasburicase  sulfasalazine This list may not describe all possible interactions. Give your health care provider a list of all the medicines, herbs, non-prescription drugs, or dietary supplements you use. Also tell them if you smoke, drink alcohol, or use illegal drugs. Some items may interact with your medicine. What should I watch for while using this medicine? Your condition will be monitored carefully while you are receiving this medicine. Be careful to avoid injury while the area is numb, and you are not aware of pain. What side effects may I notice from receiving this medicine? Side effects that you should report to your doctor or health care professional as soon as possible:  allergic reactions like skin rash, itching or hives, swelling of the face, lips, or tongue  seizures  signs and symptoms of a dangerous change in heartbeat or heart rhythm like chest pain; dizziness; fast, irregular heartbeat; palpitations; feeling faint or lightheaded; falls; breathing problems  signs and symptoms of methemoglobinemia such as pale, gray, or blue colored skin; headache; fast heartbeat; shortness of breath; feeling faint or lightheaded, falls; tiredness Side effects that usually do not require medical attention (report to your doctor or health care professional if they continue or are bothersome):  anxious  back pain  changes in taste  changes in vision  constipation  dizziness  fever  nausea, vomiting This list may not describe all possible side effects. Call your doctor for medical advice about  side effects. You may report side effects to FDA at 1-800-FDA-1088. Where should I keep my medicine? This drug is given in a hospital or clinic and will not be stored at home. NOTE: This sheet is a summary. It may not cover all possible information. If you have questions about this medicine, talk to your doctor, pharmacist, or health care provider.  2020 Elsevier/Gold Standard (2018-11-29 10:48:23)   AMBULATORY SURGERY  DISCHARGE INSTRUCTIONS   1) The drugs that you were given will stay in your system until tomorrow so for the next 24 hours you should not:  A) Drive an automobile B) Make any legal decisions C) Drink any alcoholic beverage   2) You may resume regular meals tomorrow.  Today it is better to start with liquids and gradually work up to solid foods.  You may eat anything you prefer, but it is better to start with liquids, then soup and crackers, and gradually work up to solid foods.   3) Please notify your doctor immediately if you have any unusual bleeding, trouble breathing, redness and pain at the surgery site, drainage, fever, or pain not relieved by medication.    4) Additional Instructions:        Please contact your physician with any problems or Same Day Surgery at [phone], Monday through Friday 6 am to 4 pm, or [place] at [facility] number at [phone].

## 2019-10-10 NOTE — Transfer of Care (Signed)
Immediate Anesthesia Transfer of Care Note  Patient: Tom Barrett  Procedure(s) Performed: TOTAL KNEE ARTHROPLASTY (Left Knee)  Patient Location: PACU  Anesthesia Type:Spinal  Level of Consciousness: awake, alert , oriented and patient cooperative  Airway & Oxygen Therapy: Patient Spontanous Breathing  Post-op Assessment: Report given to RN and Post -op Vital signs reviewed and stable  Post vital signs: Reviewed and stable  Last Vitals:  Vitals Value Taken Time  BP 130/74 10/10/19 1322  Temp    Pulse 93 10/10/19 1326  Resp 19 10/10/19 1326  SpO2 95 % 10/10/19 1326  Vitals shown include unvalidated device data.  Last Pain:  Vitals:   10/10/19 1321  TempSrc:   PainSc: (P) 0-No pain         Complications: No complications documented.
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
## 2019-10-11 ENCOUNTER — Encounter: Payer: Self-pay | Admitting: Surgery

## 2019-10-11 NOTE — Anesthesia Postprocedure Evaluation (Signed)
Anesthesia Post Note  Patient: Tom Barrett  Procedure(s) Performed: TOTAL KNEE ARTHROPLASTY (Left Knee)  Patient location during evaluation: PACU Anesthesia Type: Spinal Level of consciousness: oriented and awake and alert Pain management: pain level controlled Vital Signs Assessment: post-procedure vital signs reviewed and stable Respiratory status: spontaneous breathing, respiratory function stable and nonlabored ventilation Cardiovascular status: blood pressure returned to baseline and stable Postop Assessment: no headache, no backache and spinal receding Anesthetic complications: no   No complications documented.   Last Vitals:  Vitals:   10/10/19 1619 10/10/19 1730  BP: (!) 167/98 (!) 162/89  Pulse: 72 84  Resp: 16 18  Temp: (!) 36.3 C (!) 36.4 C  SpO2: 99% 97%    Last Pain:  Vitals:   10/10/19 1730  TempSrc: Temporal  PainSc: 0-No pain                 Meeyah Ovitt

## 2020-05-30 ENCOUNTER — Other Ambulatory Visit: Payer: Self-pay | Admitting: Surgery

## 2020-06-06 ENCOUNTER — Other Ambulatory Visit
Admission: RE | Admit: 2020-06-06 | Discharge: 2020-06-06 | Disposition: A | Discharge status: Home / Self Care | Payer: PRIVATE HEALTH INSURANCE | Source: Ambulatory Visit | Attending: Surgery | Admitting: Surgery

## 2020-06-06 ENCOUNTER — Other Ambulatory Visit: Payer: Self-pay

## 2020-06-06 NOTE — Patient Instructions (Signed)
Your procedure is scheduled on: Thursday June 13, 2020. Report to Day Surgery inside [facility] 2nd floor (stop by admissions desk first before getting on elevator). To find out your arrival time please call [phone] between 1PM - 3PM on Wednesday June 12, 2020.  Remember: Instructions that are not followed completely may result in serious medical risk,  up to and including death, or upon the discretion of your surgeon and anesthesiologist your  surgery may need to be rescheduled.     _X__ 1. Do not eat food after midnight the night before your procedure.                 No chewing gum or hard candies. You may drink clear liquids up to 2 hours                 before you are scheduled to arrive for your surgery- DO not drink clear                 liquids within 2 hours of the start of your surgery.                 Clear Liquids include:  water, apple juice without pulp, clear Gatorade, G2 or                  Gatorade Zero (avoid Red/Purple/Blue), Black Coffee or Tea (Do not add                 anything to coffee or tea).  __X__2.   Complete the "Ensure Clear Pre-surgery Clear Carbohydrate Drink" provided to you, 2 hours before arrival. **If you are diabetic you will be provided with an alternative drink, Gatorade Zero or G2.  __X__3.  On the morning of surgery brush your teeth with toothpaste and water, you                may rinse your mouth with mouthwash if you wish.  Do not swallow any toothpaste of mouthwash.     _X__ 4.  No Alcohol for 24 hours before or after surgery.   _X__ 5.  Do Not Smoke or use e-cigarettes For 24 Hours Prior to Your Surgery.                 Do not use any chewable tobacco products for at least 6 hours prior to                 Surgery.  _X__  6.  Do not use any recreational drugs (marijuana, cocaine, heroin, ecstasy, MDMA or other)                For at least one week prior to your surgery.  Combination of these drugs with  anesthesia                May have life threatening results.  __X_ 7.  Notify your doctor if there is any change in your medical condition      (cold, fever, infections).     Do not wear jewelry, make-up, hairpins, clips or nail polish. Do not wear lotions, powders, or perfumes. You may wear deodorant. Do not shave 48 hours prior to surgery. Men may shave face and neck. Do not bring valuables to the hospital.    [facility] is not responsible for any belongings or valuables.  Contacts, dentures or bridgework may not be worn into surgery. Leave your suitcase in the car. After surgery  it may be brought to your room. For patients admitted to the hospital, discharge time is determined by your treatment team.   Patients discharged the day of surgery will not be allowed to drive home.   Make arrangements for someone to be with you for the first 24 hours of your Same Day Discharge.   __X__ Take these medicines the morning of surgery with A SIP OF WATER:    1. levothyroxine (SYNTHROID) 137 MCG   2. diltiazem (DILACOR XR) 240 MG  3. gabapentin (NEURONTIN) 300 MG   4.  5.  6.  ____ Fleet Enema (as directed)   __X__ Use CHG Soap (or wipes) as directed  ____ Use Benzoyl Peroxide Gel as instructed  ____ Use inhalers on the day of surgery  ____ Stop metformin 2 days prior to surgery    ____ Take 1/2 of usual insulin dose the night before surgery. No insulin the morning          of surgery.   __X__ Stop Anti-inflammatories such as Ibuprofen, Aleve, Advil, Motrin, naproxen, aspirin, Goody's or BC powders.    __X__ Stop supplements until after surgery.    __X__ Do not start any herbal supplements before your procedure.   If you have any questions regarding your pre-procedure instructions,  Please call Pre-admit Testing at [phone].

## 2020-06-11 ENCOUNTER — Other Ambulatory Visit: Payer: Self-pay

## 2020-06-11 ENCOUNTER — Other Ambulatory Visit
Admission: RE | Admit: 2020-06-11 | Discharge: 2020-06-11 | Disposition: A | Discharge status: Home / Self Care | Payer: PRIVATE HEALTH INSURANCE | Source: Ambulatory Visit | Attending: Surgery | Admitting: Surgery

## 2020-06-11 ENCOUNTER — Other Ambulatory Visit: Payer: PRIVATE HEALTH INSURANCE

## 2020-06-11 DIAGNOSIS — Z01818 Encounter for other preprocedural examination: Secondary | ICD-10-CM | POA: Insufficient documentation

## 2020-06-11 DIAGNOSIS — Z20822 Contact with and (suspected) exposure to covid-19: Secondary | ICD-10-CM | POA: Diagnosis not present

## 2020-06-11 LAB — BASIC METABOLIC PANEL
Anion gap: 7 (ref 5–15)
BUN: 14 mg/dL (ref 6–20)
CO2: 25 mmol/L (ref 22–32)
Calcium: 8.9 mg/dL (ref 8.9–10.3)
Chloride: 107 mmol/L (ref 98–111)
Creatinine, Ser: 1.04 mg/dL (ref 0.61–1.24)
GFR, Estimated: >60 mL/min (ref >60)
Glucose, Bld: 90 mg/dL (ref 70–99)
Potassium: 3.5 mmol/L (ref 3.5–5.1)
Sodium: 139 mmol/L (ref 135–145)

## 2020-06-11 LAB — CBC
HCT: 38.6 % — ABNORMAL LOW (ref 39.0–52.0)
Hemoglobin: 13.2 g/dL (ref 13.0–17.0)
MCH: 29.9 pg (ref 26.0–34.0)
MCHC: 34.2 g/dL (ref 30.0–36.0)
MCV: 87.5 fL (ref 80.0–100.0)
Platelets: 219 10*3/uL (ref 150–400)
RBC: 4.41 MIL/uL (ref 4.22–5.81)
RDW: 13.4 % (ref 11.5–15.5)
WBC: 4.6 10*3/uL (ref 4.0–10.5)
nRBC: 0 % (ref 0.0–0.2)

## 2020-06-11 LAB — SARS CORONAVIRUS 2 (TAT 6-24 HRS): SARS Coronavirus 2: NEGATIVE

## 2020-06-13 ENCOUNTER — Encounter: Payer: Self-pay | Admitting: Surgery

## 2020-06-13 ENCOUNTER — Ambulatory Visit: Payer: PRIVATE HEALTH INSURANCE | Admitting: Anesthesiology

## 2020-06-13 ENCOUNTER — Encounter
Admission: RE | Disposition: A | Discharge status: Home / Self Care | Payer: Self-pay | Source: Home / Self Care | Attending: Surgery

## 2020-06-13 ENCOUNTER — Ambulatory Visit
Admission: RE | Admit: 2020-06-13 | Discharge: 2020-06-13 | Disposition: A | Discharge status: Home / Self Care | Payer: PRIVATE HEALTH INSURANCE | Attending: Surgery | Admitting: Surgery

## 2020-06-13 ENCOUNTER — Other Ambulatory Visit: Payer: Self-pay

## 2020-06-13 DIAGNOSIS — Z79890 Hormone replacement therapy (postmenopausal): Secondary | ICD-10-CM | POA: Insufficient documentation

## 2020-06-13 DIAGNOSIS — Z79899 Other long term (current) drug therapy: Secondary | ICD-10-CM | POA: Insufficient documentation

## 2020-06-13 DIAGNOSIS — Z880 Allergy status to penicillin: Secondary | ICD-10-CM | POA: Diagnosis not present

## 2020-06-13 DIAGNOSIS — G5601 Carpal tunnel syndrome, right upper limb: Secondary | ICD-10-CM | POA: Insufficient documentation

## 2020-06-13 DIAGNOSIS — Z87891 Personal history of nicotine dependence: Secondary | ICD-10-CM | POA: Diagnosis not present

## 2020-06-13 DIAGNOSIS — Z888 Allergy status to other drugs, medicaments and biological substances status: Secondary | ICD-10-CM | POA: Diagnosis not present

## 2020-06-13 HISTORY — PX: CARPAL TUNNEL RELEASE: SHX101

## 2020-06-13 SURGERY — RELEASE, CARPAL TUNNEL, ENDOSCOPIC
Anesthesia: General | Site: Wrist | Laterality: Right

## 2020-06-13 MED ORDER — SODIUM CHLORIDE 0.9 % IV SOLN: Freq: Once | INTRAVENOUS | Status: DC

## 2020-06-13 MED ORDER — LIDOCAINE HCL (PF) 2 % IJ SOLN
INTRAMUSCULAR | Status: AC
  Filled 2020-06-13: qty 5

## 2020-06-13 MED ORDER — CHLORHEXIDINE GLUCONATE 0.12 % MT SOLN
OROMUCOSAL | Status: AC
  Administered 2020-06-13: 15 mL via OROMUCOSAL
  Filled 2020-06-13: qty 15

## 2020-06-13 MED ORDER — PROPOFOL 10 MG/ML IV BOLUS
INTRAVENOUS | Status: DC | PRN
  Administered 2020-06-13: 200 mg via INTRAVENOUS

## 2020-06-13 MED ORDER — PROPOFOL 10 MG/ML IV BOLUS
INTRAVENOUS | Status: AC
  Filled 2020-06-13: qty 20

## 2020-06-13 MED ORDER — FAMOTIDINE 20 MG PO TABS
ORAL_TABLET | ORAL | Status: AC
  Administered 2020-06-13: 20 mg via ORAL
  Filled 2020-06-13: qty 1

## 2020-06-13 MED ORDER — BUPIVACAINE HCL (PF) 0.5 % IJ SOLN
INTRAMUSCULAR | Status: AC
  Filled 2020-06-13: qty 30

## 2020-06-13 MED ORDER — CEFAZOLIN SODIUM-DEXTROSE 2-4 GM/100ML-% IV SOLN
INTRAVENOUS | Status: AC
  Filled 2020-06-13: qty 100

## 2020-06-13 MED ORDER — LIDOCAINE HCL (CARDIAC) PF 100 MG/5ML IV SOSY
PREFILLED_SYRINGE | INTRAVENOUS | Status: DC | PRN
  Administered 2020-06-13: 100 mg via INTRAVENOUS

## 2020-06-13 MED ORDER — DEXAMETHASONE SODIUM PHOSPHATE 10 MG/ML IJ SOLN
INTRAMUSCULAR | Status: DC | PRN
  Administered 2020-06-13: 10 mg via INTRAVENOUS

## 2020-06-13 MED ORDER — CEFAZOLIN SODIUM-DEXTROSE 2-4 GM/100ML-% IV SOLN
2.0000 g | INTRAVENOUS | Status: AC
  Administered 2020-06-13: 2 g via INTRAVENOUS

## 2020-06-13 MED ORDER — MIDAZOLAM HCL 2 MG/2ML IJ SOLN
INTRAMUSCULAR | Status: AC
  Filled 2020-06-13: qty 2

## 2020-06-13 MED ORDER — FENTANYL CITRATE (PF) 100 MCG/2ML IJ SOLN
INTRAMUSCULAR | Status: DC | PRN
  Administered 2020-06-13 (×2): 25 ug via INTRAVENOUS

## 2020-06-13 MED ORDER — ORAL CARE MOUTH RINSE: 15.0000 mL | Freq: Once | OROMUCOSAL | Status: AC

## 2020-06-13 MED ORDER — BUPIVACAINE HCL (PF) 0.5 % IJ SOLN
INTRAMUSCULAR | Status: DC | PRN
  Administered 2020-06-13: 10 mL

## 2020-06-13 MED ORDER — CHLORHEXIDINE GLUCONATE 0.12 % MT SOLN: 15.0000 mL | Freq: Once | OROMUCOSAL | Status: AC

## 2020-06-13 MED ORDER — FAMOTIDINE 20 MG PO TABS: 20.0000 mg | ORAL_TABLET | Freq: Once | ORAL | Status: AC

## 2020-06-13 MED ORDER — FENTANYL CITRATE (PF) 100 MCG/2ML IJ SOLN
INTRAMUSCULAR | Status: AC
  Filled 2020-06-13: qty 2

## 2020-06-13 MED ORDER — ONDANSETRON HCL 4 MG/2ML IJ SOLN
INTRAMUSCULAR | Status: DC | PRN
  Administered 2020-06-13: 4 mg via INTRAVENOUS

## 2020-06-13 MED ORDER — LACTATED RINGERS IV SOLN: INTRAVENOUS | Status: DC

## 2020-06-13 MED ORDER — DEXAMETHASONE SODIUM PHOSPHATE 10 MG/ML IJ SOLN
INTRAMUSCULAR | Status: AC
  Filled 2020-06-13: qty 1

## 2020-06-13 MED ORDER — ONDANSETRON HCL 4 MG/2ML IJ SOLN
INTRAMUSCULAR | Status: AC
  Filled 2020-06-13: qty 2

## 2020-06-13 MED ORDER — MIDAZOLAM HCL 2 MG/2ML IJ SOLN
INTRAMUSCULAR | Status: DC | PRN
  Administered 2020-06-13: 2 mg via INTRAVENOUS

## 2020-06-13 MED ORDER — TRAMADOL HCL 50 MG PO TABS: 50.0000 mg | ORAL_TABLET | Freq: Four times a day (QID) | ORAL | 0 refills | Status: DC | PRN

## 2020-06-13 SURGICAL SUPPLY — 33 items
APL PRP STRL LF DISP 70% ISPRP (MISCELLANEOUS) ×1
BNDG COHESIVE 4X5 TAN STRL (GAUZE/BANDAGES/DRESSINGS) ×2 IMPLANT
BNDG ELASTIC 2X5.8 VLCR STR LF (GAUZE/BANDAGES/DRESSINGS) ×2 IMPLANT
BNDG ESMARK 4X12 TAN STRL LF (GAUZE/BANDAGES/DRESSINGS) ×2 IMPLANT
CANISTER SUCT 1200ML W/VALVE (MISCELLANEOUS) ×1 IMPLANT
CHLORAPREP W/TINT 26 (MISCELLANEOUS) ×2 IMPLANT
CORD BIP STRL DISP 12FT (MISCELLANEOUS) ×2 IMPLANT
COVER WAND RF STERILE (DRAPES) ×2 IMPLANT
CUFF TOURN SGL QUICK 18X4 (TOURNIQUET CUFF) ×2 IMPLANT
DRAPE SURG 17X11 SM STRL (DRAPES) ×2 IMPLANT
FORCEPS JEWEL BIP 4-3/4 STR (INSTRUMENTS) ×2 IMPLANT
GAUZE SPONGE 4X4 12PLY STRL (GAUZE/BANDAGES/DRESSINGS) ×2 IMPLANT
GAUZE XEROFORM 1X8 LF (GAUZE/BANDAGES/DRESSINGS) ×2 IMPLANT
GLOVE SURG ENC MOIS LTX SZ8 (GLOVE) ×2 IMPLANT
GLOVE SURG UNDER LTX SZ8 (GLOVE) ×2 IMPLANT
GOWN STRL REUS W/ TWL LRG LVL3 (GOWN DISPOSABLE) ×1 IMPLANT
GOWN STRL REUS W/ TWL XL LVL3 (GOWN DISPOSABLE) ×1 IMPLANT
GOWN STRL REUS W/TWL LRG LVL3 (GOWN DISPOSABLE) ×2
GOWN STRL REUS W/TWL XL LVL3 (GOWN DISPOSABLE) ×2
KIT CARPAL TUNNEL (MISCELLANEOUS) ×2
KIT ESCP INSRT D SLOT CANN KN (MISCELLANEOUS) ×1 IMPLANT
KIT TURNOVER KIT A (KITS) ×2 IMPLANT
MANIFOLD NEPTUNE II (INSTRUMENTS) ×2 IMPLANT
NS IRRIG 500ML POUR BTL (IV SOLUTION) ×2 IMPLANT
PACK EXTREMITY ARMC (MISCELLANEOUS) ×2 IMPLANT
SPLINT WRIST LG LT TX990309 (SOFTGOODS) IMPLANT
SPLINT WRIST LG RT TX900304 (SOFTGOODS) IMPLANT
SPLINT WRIST M LT TX990308 (SOFTGOODS) IMPLANT
SPLINT WRIST M RT TX990303 (SOFTGOODS) IMPLANT
SPLINT WRIST XL LT TX990310 (SOFTGOODS) IMPLANT
SPLINT WRIST XL RT TX990305 (SOFTGOODS) ×1 IMPLANT
STOCKINETTE IMPERVIOUS 9X36 MD (GAUZE/BANDAGES/DRESSINGS) ×2 IMPLANT
SUT PROLENE 4 0 PS 2 18 (SUTURE) ×2 IMPLANT

## 2020-06-13 NOTE — Transfer of Care (Signed)
Immediate Anesthesia Transfer of Care Note  Patient: Tom Barrett  Procedure(s) Performed: CARPAL TUNNEL RELEASE ENDOSCOPIC (Right Wrist)  Patient Location: PACU  Anesthesia Type:General  Level of Consciousness: drowsy  Airway & Oxygen Therapy: Patient Spontanous Breathing and Patient connected to face mask oxygen  Post-op Assessment: Report given to RN and Post -op Vital signs reviewed and stable  Post vital signs: Reviewed and stable  Last Vitals:  Vitals Value Taken Time  BP 113/67 06/13/20 1415  Temp    Pulse 58 06/13/20 1415  Resp 8 06/13/20 1415  SpO2 98 % 06/13/20 1415  Vitals shown include unvalidated device data.  Last Pain:  Vitals:   06/13/20 1131  TempSrc: Tympanic  PainSc: 2          Complications: No complications documented.

## 2020-06-13 NOTE — Op Note (Signed)
06/13/2020  2:12 PM  Patient:   Tom SHEHADEH  Pre-Op Diagnosis:   Right carpal tunnel syndrome.  Post-Op Diagnosis:   Same.  Procedure:   Endoscopic right carpal tunnel release.  Surgeon:   Pascal Lux, MD  Anesthesia:   General LMA  Findings:   As above.  Complications:   None  EBL:   0 cc  Fluids:   500 cc crystalloid  TT:   15 minutes at 250 mmHg  Drains:   None  Closure:   4-0 Prolene interrupted sutures  Brief Clinical Note:   The patient is a 60 year old male with a history of progressively worsening pain and paresthesias to his right hand. His symptoms have progressed despite medications, activity modification, etc. His history and examination consistent with carpal tunnel syndrome, confirmed by EMG. The patient presents at this time for an endoscopic right carpal tunnel release.   Procedure:   The patient was brought into the operating room and lain in the supine position. After adequate general laryngeal mask anesthesia was obtained, the right hand and upper extremity were prepped with ChloraPrep solution before being draped sterilely. Preoperative antibiotics were administered. A timeout was performed to verify the appropriate surgical site before the limb was exsanguinated with an Esmarch and the tourniquet inflated to 250 mmHg.   An approximately 1.5-2 cm incision was made over the volar wrist flexion crease, centered over the palmaris longus tendon. The incision was carried down through the subcutaneous tissues with care taken to identify and protect any neurovascular structures. The distal forearm fascia was penetrated just proximal to the transverse carpal ligament. The soft tissues were released off the superficial and deep surfaces of the distal forearm fascia and this was released proximally for 3-4 cm under direct visualization.  Attention was directed distally. The Soil scientist was passed beneath the transverse carpal ligament along the ulnar aspect of  the carpal tunnel and used to release any adhesions as well as to remove any adherent synovial tissue before first the smaller then the larger of the two dilators were passed beneath the transverse carpal ligament along the ulnar margin of the carpal tunnel. The slotted cannula was introduced and the endoscope was placed into the slotted cannula and the undersurface of the transverse carpal ligament visualized. The distal margin of the transverse carpal ligament was marked by placing a 25-gauge needle percutaneously at [place] cardinal point so that it entered the distal portion of the slotted cannula. Under endoscopic visualization, the transverse carpal ligament was released from proximal to distal using the end-cutting blade. A second pass was performed to ensure complete release of the ligament. The adequacy of release was verified both endoscopically and by palpation using the freer elevator.  The wound was irrigated thoroughly with sterile saline solution before being closed using 4-0 Prolene interrupted sutures. A total of 10 cc of 0.5% plain Sensorcaine was injected in and around the incision before a sterile bulky dressing was applied to the wound. The patient was placed into a volar wrist splint before being awakened, extubated, and returned to the recovery room in satisfactory condition after tolerating the procedure well.

## 2020-06-13 NOTE — Discharge Instructions (Addendum)
Orthopedic discharge instructions: Keep dressing dry and intact. Keep hand elevated above heart level. May shower after dressing removed on postop day 4 (Monday). Cover sutures with Band-Aids after drying off, then reapply Velcro splint. Apply ice to affected area frequently. Take ibuprofen 600-800 mg TID with meals for 7-10 days, then as necessary. Take ES Tylenol or pain medication as prescribed when needed.  Return for follow-up in 10-14 days or as scheduled.  AMBULATORY SURGERY  DISCHARGE INSTRUCTIONS   1) The drugs that you were given will stay in your system until tomorrow so for the next 24 hours you should not:  A) Drive an automobile B) Make any legal decisions C) Drink any alcoholic beverage   2) You may resume regular meals tomorrow.  Today it is better to start with liquids and gradually work up to solid foods.  You may eat anything you prefer, but it is better to start with liquids, then soup and crackers, and gradually work up to solid foods.   3) Please notify your doctor immediately if you have any unusual bleeding, trouble breathing, redness and pain at the surgery site, drainage, fever, or pain not relieved by medication.    4) Additional Instructions:    Please contact your physician with any problems or Same Day Surgery at [phone], Monday through Friday 6 am to 4 pm, or [place] at [facility] number at [phone].

## 2020-06-13 NOTE — Anesthesia Postprocedure Evaluation (Signed)
Anesthesia Post Note  Patient: ROARK RUFO  Procedure(s) Performed: CARPAL TUNNEL RELEASE ENDOSCOPIC (Right Wrist)  Patient location during evaluation: PACU Anesthesia Type: General Level of consciousness: awake and alert Pain management: pain level controlled Vital Signs Assessment: post-procedure vital signs reviewed and stable Respiratory status: spontaneous breathing, nonlabored ventilation, respiratory function stable and patient connected to nasal cannula oxygen Cardiovascular status: blood pressure returned to baseline and stable Postop Assessment: no apparent nausea or vomiting Anesthetic complications: no   No complications documented.   Last Vitals:  Vitals:   06/13/20 1445 06/13/20 1500  BP: (!) 162/96 (!) 166/99  Pulse: 65 64  Resp: 12 11  Temp:  37 C  SpO2: 97% 99%    Last Pain:  Vitals:   06/13/20 1500  TempSrc:   PainSc: 0-No pain                 Precious Haws Kaylanie Capili

## 2020-06-13 NOTE — H&P (Signed)
History of Present Illness:  Tom Barrett is a 60 y.o. male who presents for evaluation and treatment of his right hand and wrist pain and paresthesias. The patient notes that he has had the symptoms for "years", but that his symptoms have worsened over the past few months. He saw Cassell Smiles, PA-C, who ordered an EMG then referred him to Dr. Candelaria Stagers for an ultrasound-guided carpal tunnel injection. Patient notes this injection provided temporary partial relief of symptoms before his symptoms recurred, prompting him to make this appointment. The patient notes that he has pain at night, frequently awakening from sleep. He also has difficulty with any repetitive or sustained activities, such as some of the fine motor activities required of him at work as well as when driving a car. He describes numbness to all digits, but especially his thumb, index, and long fingers. He recently has undergone an EMG of the right upper extremity which confirms the presence of advanced carpal tunnel syndrome.  Current Outpatient Medications: . diltiazem (CARDIZEM CD) 240 MG CD capsule Take 240 mg by mouth once daily.  Marland Kitchen doxazosin (CARDURA) 8 MG tablet Take 8 mg by mouth nightly.  . gabapentin (NEURONTIN) 300 MG capsule 300 mg nightly  . hydroCHLOROthiazide (MICROZIDE) 12.5 mg capsule Take 12.5 mg by mouth once daily  . hydrocortisone (ANUSOL-HC) 2.5 % rectal cream Apply topically 3 (three) times daily as needed for Hemorrhoids  . ibuprofen (ADVIL,MOTRIN) 200 MG tablet Take 400 mg by mouth every 6 (six) hours as needed  . levothyroxine (SYNTHROID, LEVOTHROID) 137 MCG tablet Take 137 mcg by mouth once daily Take 2 TABLETS on an empty stomach with a glass of water at least 30-60 minutes before breakfast.  . potassium chloride (MICRO-K) 10 MEQ ER capsule Take 10 mEq by mouth every other day  . quinapril (ACCUPRIL) 40 MG tablet Take 40 mg by mouth 2 (two) times daily.  . tadalafiL (CIALIS) 5 MG tablet Take 5 mg by mouth once daily  as needed   Allergies:  . Amlodipine Other (See Comments)  . Metoprolol Other (See Comments)  . Penicillin Unknown   Past Medical History:  . Anxiety  . Back pain  . Dizziness  . Edema  . Elevated PSA  . Fatigue  . Heberden's nodes  . History of anemia  . HTN (hypertension)  . Hyperlipidemia  . Hypokalemia  . Hypothyroidism  . Osteoarthritis  . Sleep apnea  . Tubular adenoma of colon, unspecified 10/29/2014   Past Surgical History:  . COLONOSCOPY 10/29/2014 (Tubular adenoma of colon/Repeat 41yrs/MGR)  . Left TKA using all-cemented Biomet Vanguard system with a 75 mm PCR femur, a 79 mm tibial tray with a 10 mm anterior stabilized E-poly insert, and a 40 x 10.5 mm all-poly 3-pegged domed patella Left 10/10/2019 (Dr. Roland Rack)  . Right total knee arthroplasty using all cemented biomet vanguard system with a 75 mm PCR femur a 77 mm tibial tray with a 10 mm AS E-poly insert and a 37 x 10 mm all poly 3 pegged domed patella Right 11/09/2017 (Dr. Roland Rack)   Family History  . No Known Problems Mother   Social History:   Socioeconomic History:  Marland Kitchen Marital status: Married  Spouse name: Not on file  . Number of children: Not on file  . Years of education: Not on file  . Highest education level: Not on file  Occupational History  . Not on file  Tobacco Use  . Smoking status: Former Research scientist (life sciences)  . Smokeless tobacco: Never  Used  Vaping Use  . Vaping Use: Never used  Substance and Sexual Activity  . Alcohol use: Never  . Drug use: Never  . Sexual activity: Not on file  Other Topics Concern  . Not on file  Social History Narrative  . Not on file   Social Determinants of Health:   Financial Resource Strain: Not on file  Food Insecurity: Not on file  Transportation Needs: Not on file   Review of Systems:  A comprehensive 14 point ROS was performed, reviewed, and the pertinent orthopaedic findings are documented in the HPI.  Physical Exam: Vitals:  05/27/20 1410  BP: (!) 148/102   Weight: (!) 106.1 kg (233 lb 12.8 oz)  Height: 180.3 cm (5\' 11" )  PainSc: 2  PainLoc: Hand   General/Constitutional: The patient appears to be well-nourished, well-developed, and in no acute distress. Neuro/Psych: Normal mood and affect, oriented to person, place and time. Eyes: Non-icteric. Pupils are equal, round, and reactive to light, and exhibit synchronous movement. ENT: Unremarkable. Lymphatic: No palpable adenopathy. Respiratory: Lungs clear to auscultation, Normal chest excursion, No wheezes and Non-labored breathing Cardiovascular: Regular rate and rhythm. No murmurs. and No edema, swelling or tenderness, except as noted in detailed exam. Integumentary: No impressive skin lesions present, except as noted in detailed exam. Musculoskeletal: Unremarkable, except as noted in detailed exam.  Right hand/wrist exam: Skin inspection of the right hand and wrist is unremarkable. No swelling, erythema, ecchymosis, abrasions, or other skin abnormalities are identified. He exhibits full active and passive range of motion of the right wrist without any pain or catching. He is able to actively flex extend all digits fully without any pain or triggering. He is neurovascularly intact to his right hand, other than subjectively decreased sensation to light touch to the tips of all digits. He has a positive Phalen's test, as well as a positive Tinel's over the carpal tunnel.  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  Assessment: . Carpal tunnel syndrome, right   Plan: The treatment options were discussed with the patient. In addition, patient educational materials were provided regarding the diagnosis and  treatment options. The patient is quite frustrated by his symptoms and functional limitations, especially as they pertain to his right hand. Therefore, I have recommended a surgical procedure, specifically an endoscopic right carpal tunnel release. The procedure was discussed with the patient, as were the potential risks (including bleeding, infection, nerve and/or blood vessel injury, persistent or recurrent pain/paresthesias, weakness of grip, need for further surgery, blood clots, strokes, heart attacks and/or arhythmias, pneumonia, etc.) and benefits. The patient states his/her understanding and wishes to proceed. All of the patient's questions and concerns were answered. He can call any time with further concerns. He will follow up post-surgery, routine.   H&P reviewed and patient re-examined. No changes.

## 2020-06-13 NOTE — Anesthesia Procedure Notes (Signed)
Procedure Name: LMA Insertion Date/Time: 06/13/2020 1:33 PM Performed by: Malachi Paradise, RN Pre-anesthesia Checklist: Patient identified, Patient being monitored, Timeout performed, Emergency Drugs available and Suction available Patient Re-evaluated:Patient Re-evaluated prior to induction Oxygen Delivery Method: Circle system utilized Preoxygenation: Pre-oxygenation with 100% oxygen Induction Type: IV induction Ventilation: Mask ventilation without difficulty LMA: LMA inserted LMA Size: 5.0 Tube type: Oral Number of attempts: 1 Placement Confirmation: positive ETCO2,  breath sounds checked- equal and bilateral and CO2 detector Tube secured with: Tape Dental Injury: Teeth and Oropharynx as per pre-operative assessment
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
## 2020-06-14 ENCOUNTER — Encounter: Payer: Self-pay | Admitting: Surgery

## 2020-09-18 ENCOUNTER — Other Ambulatory Visit: Payer: Self-pay | Admitting: Surgery

## 2020-10-02 ENCOUNTER — Encounter
Admission: RE | Admit: 2020-10-02 | Discharge: 2020-10-02 | Disposition: A | Discharge status: Home / Self Care | Payer: PRIVATE HEALTH INSURANCE | Source: Ambulatory Visit | Attending: Surgery | Admitting: Surgery

## 2020-10-02 ENCOUNTER — Other Ambulatory Visit: Payer: Self-pay

## 2020-10-02 HISTORY — DX: Carpal tunnel syndrome, bilateral upper limbs: G56.03

## 2020-10-02 NOTE — Progress Notes (Signed)
Perioperative Services Pre-Admission/Anesthesia Testing   Date: 10/02/20 Name: Tom Barrett MRN:   [id number]  Re: Consideration of preoperative prophylactic antibiotic change   Request sent to: Poggi, Marshall Cork, MD (routed and/or faxed via [facility])  Planned Surgical Procedure(s):    Case: [id number] Date/Time: 10/09/20 0715   Procedure: LEFT CARPAL TUNNEL RELEASE ENDOSCOPIC WITH SUBCUTANEOUS ANTERIOR TRANSPOSITION OF ULNER NERVE AT LEFT ELBOW. (Left: Wrist)   Anesthesia type: Choice   Pre-op diagnosis:      Carpal tunnel syndrome, left G56.02     Cubital tunnel syndrome, left G56.22   Location: [facility] OR ROOM 02 / [facility]   Surgeons: Corky Mull, MD     Notes: Patient has a documented allergy to PCN  The actual reaction to PCN is unknown.   Received cephalosporin with no documented complications CEFAZOLIN received on 10/10/2019  Screened as appropriate for cephalosporin use during medication reconciliation No immediate angioedema, dysphagia, SOB, anaphylaxis symptoms. No severe rash involving mucous membranes or skin necrosis. No hospital admissions related to side effects of PCN/cephalosporin use.  No documented reaction to PCN or cephalosporin in the last 10 years.  Request:  As an evidence based approach to reducing the rate of incidence for post-operative SSI and the development of MDROs, could an agent with narrower coverage for preoperative prophylaxis in this patient's upcoming surgical course be considered?   Currently ordered preoperative prophylactic ABX: clindamycin.   Specifically requesting change to cephalosporin (CEFAZOLIN).   Please communicate decision with me and I will change the orders in Epic as per your direction.   Things to consider: Many patients report that they were "allergic" to PCN earlier in life, however this does not translate into a true lifelong allergy. Patients can lose sensitivity to specific IgE antibodies over time if  PCN is avoided (Kleris & Lugar, 2019).  Up to 10% of the adult population and 15% of hospitalized patients report an allergy to PCN, however clinical studies suggest that 90% of those reporting an allergy can tolerate PCN antibiotics (Kleris & Lugar, 2019).  Cross-sensitivity between PCN and cephalosporins has been documented as being as high as 10%, however this estimation included data believed to have been collected in a setting where there was contamination. Newer data suggests that the prevalence of cross-sensitivity between PCN and cephalosporins is actually estimated to be closer to 1% (Hermanides et al., 2018).   Patients labeled as PCN allergic, whether they are truly allergic or not, have been found to have inferior outcomes in terms of rates of serious infection, and these patients tend to have longer hospital stays (Lupton, 2019).  Treatment related secondary infections, such as Clostridioides difficile, have been linked to the improper use of broad spectrum antibiotics in patients improperly labeled as PCN allergic (Kleris & Lugar, 2019).  Anaphylaxis from cephalosporins is rare and the evidence suggests that there is no increased risk of an anaphylactic type reaction when cephalosporins are used in a PCN allergic patient (Pichichero, 2006).  Citations: Hermanides J, Lemkes BA, Prins Pearla Dubonnet MW, Terreehorst I. Presumed ?-Lactam Allergy and Cross-reactivity in the Operating Theater: A Practical Approach. Anesthesiology. 2018 Aug;129(2):335-342. doi: 10.1097/ALN.0000000000002252. PMID: [id number].  Kleris, Bigelow., & Lugar, P. L. (2019). Things We Do For No Reason: Failing to Question a Penicillin Allergy History. Journal of hospital medicine, 14(10), [phone]. Advance online publication. https://www.wallace-middleton.info/  Pichichero, M. E. (2006). Cephalosporins can be prescribed safely for penicillin-allergic patients. Journal of family medicine, 55(2), 106-112. Accessed:  https://cdn.mdedge.com/files/s50f-public/Document/September-2017/5502JFP_AppliedEvidence1.pdf   BHonor Loh MSN, APRN, FNP-C, CEN [facility] Peri-operative Services Nurse Practitioner FAX: ([phone]/03/22 2:30 PM

## 2020-10-02 NOTE — Patient Instructions (Addendum)
Your procedure is scheduled on: Wednesday, August 10 Report to the Registration Desk on the 1st floor of the [facility]. To find out your arrival time, please call [phone] between 1PM - 3PM on: Tuesday, August 9  REMEMBER: Instructions that are not followed completely may result in serious medical risk, up to and including death; or upon the discretion of your surgeon and anesthesiologist your surgery may need to be rescheduled.  Do not eat food after midnight the night before surgery.  No gum chewing, lozengers or hard candies.  You may however, drink CLEAR liquids up to 2 hours before you are scheduled to arrive for your surgery. Do not drink anything within 2 hours of your scheduled arrival time.  Clear liquids include: - water  - apple juice without pulp - gatorade (not RED, PURPLE, OR BLUE) - black coffee or tea (Do NOT add milk or creamers to the coffee or tea) Do NOT drink anything that is not on this list.  In addition, your doctor has ordered for you to drink the provided  Ensure Pre-Surgery Clear Carbohydrate Drink  Drinking this carbohydrate drink up to two hours before surgery helps to reduce insulin resistance and improve patient outcomes. Please complete drinking 2 hours prior to scheduled arrival time.  TAKE THESE MEDICATIONS THE MORNING OF SURGERY WITH A SIP OF WATER:  Diltiazem Doxazosin (Cardura) Gabapentin Levothyroxine  One week prior to surgery: starting August 3 Stop Anti-inflammatories (NSAIDS) such as Advil, Aleve, Ibuprofen, Motrin, Naproxen, Naprosyn and Aspirin based products such as Excedrin, Goodys Powder, BC Powder. Stop ANY OVER THE COUNTER supplements until after surgery. You may however, continue to take Tylenol if needed for pain up until the day of surgery.  No Alcohol for 24 hours before or after surgery.  No Smoking including e-cigarettes for 24 hours prior to surgery.  No chewable tobacco products for at least 6 hours prior to  surgery.  No nicotine patches on the day of surgery.  Do not use any "recreational" drugs for at least a week prior to your surgery.  Please be advised that the combination of cocaine and anesthesia may have negative outcomes, up to and including death. If you test positive for cocaine, your surgery will be cancelled.  On the morning of surgery brush your teeth with toothpaste and water, you may rinse your mouth with mouthwash if you wish. Do not swallow any toothpaste or mouthwash.  Do not wear jewelry, make-up, hairpins, clips or nail polish.  Do not wear lotions, powders, or perfumes.   Do not shave body from the neck down 48 hours prior to surgery just in case you cut yourself which could leave a site for infection.  Also, freshly shaved skin may become irritated if using the CHG soap.  Contact lenses, hearing aids and dentures may not be worn into surgery.  Do not bring valuables to the hospital. [facility] is not responsible for any missing/lost belongings or valuables.   Use CHG Soap as directed on instruction sheet.  Notify your doctor if there is any change in your medical condition (cold, fever, infection).  Wear comfortable clothing (specific to your surgery type) to the hospital.  After surgery, you can help prevent lung complications by doing breathing exercises.  Take deep breaths and cough every 1-2 hours. Your doctor may order a device called an Incentive Spirometer to help you take deep breaths.  If you are being discharged the day of surgery, you will not be allowed to  drive home. You will need a responsible adult (18 years or older) to drive you home and stay with you that night.   If you are taking public transportation, you will need to have a responsible adult (18 years or older) with you. Please confirm with your physician that it is acceptable to use public transportation.   Please call the [facility] Dept. at [phone] if you have any  questions about these instructions.  Surgery Visitation Policy:  Patients undergoing a surgery or procedure may have one family member or support person with them as long as that person is not COVID-19 positive or experiencing its symptoms.  That person may remain in the waiting area during the procedure.

## 2020-10-07 ENCOUNTER — Other Ambulatory Visit: Payer: Self-pay

## 2020-10-07 ENCOUNTER — Ambulatory Visit
Admission: RE | Admit: 2020-10-07 | Discharge: 2020-10-07 | Disposition: A | Discharge status: Home / Self Care | Payer: BC Managed Care – PPO | Source: Ambulatory Visit | Attending: Family Medicine | Admitting: Family Medicine

## 2020-10-07 ENCOUNTER — Other Ambulatory Visit: Payer: Self-pay | Admitting: Family Medicine

## 2020-10-07 ENCOUNTER — Other Ambulatory Visit (HOSPITAL_COMMUNITY): Payer: Self-pay | Admitting: Family Medicine

## 2020-10-07 DIAGNOSIS — M5030 Other cervical disc degeneration, unspecified cervical region: Secondary | ICD-10-CM | POA: Diagnosis present

## 2020-10-07 DIAGNOSIS — G8929 Other chronic pain: Secondary | ICD-10-CM

## 2020-10-07 DIAGNOSIS — M5442 Lumbago with sciatica, left side: Secondary | ICD-10-CM

## 2020-10-07 DIAGNOSIS — M5412 Radiculopathy, cervical region: Secondary | ICD-10-CM | POA: Diagnosis present

## 2020-10-07 IMAGING — MR MR CERVICAL SPINE W/O CM
5 series · 35 of 48 positions shown · non-contrast
Comparison: None available.

CLINICAL DATA: Initial evaluation for neck pain with bilateral hand
numbness and tingling.

EXAM:
MRI CERVICAL SPINE WITHOUT CONTRAST
TECHNIQUE: Multiplanar, multisequence MR imaging of the cervical spine was
performed. No intravenous contrast was administered.

[Series 5: T2 · sagittal · 3.0mm · 0.62mm/px · 6 of 15 slices shown (1 of 2)]
[im 1/15]
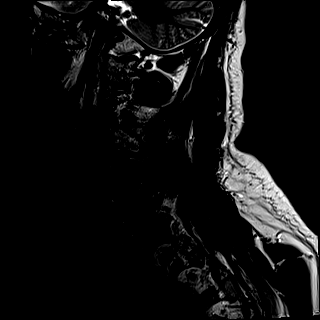
[im 3/15]
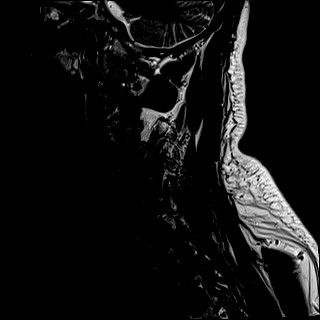
[im 6/15]
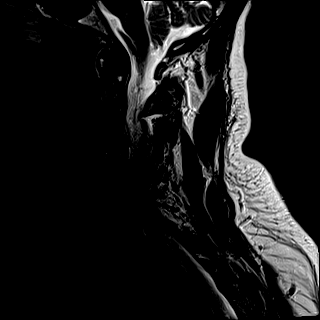
[im 9/15]
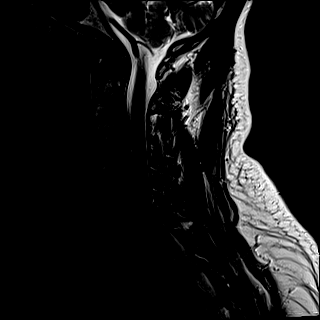
[im 12/15]
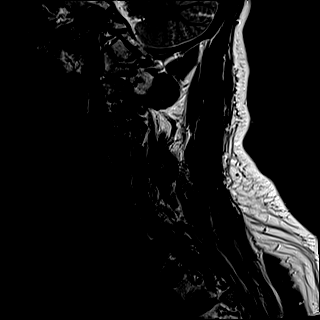
[im 15/15]
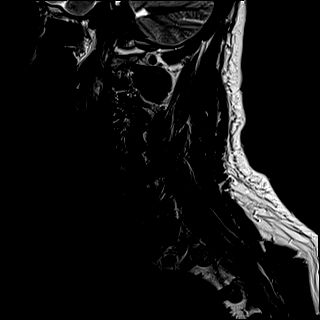

[Series 6: FLAIR · sagittal · 3.0mm · 0.78mm/px · 7 of 15 slices shown]
[im 1/15]
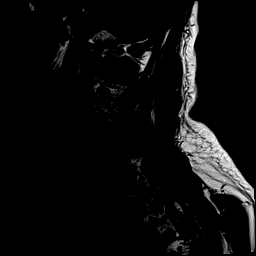
[im 3/15]
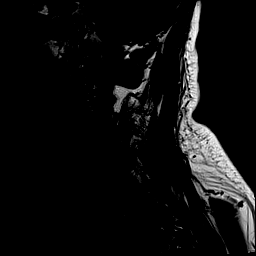
[im 5/15]
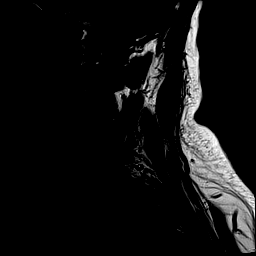
[im 8/15]
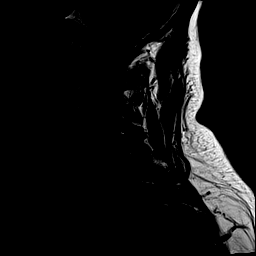
[im 10/15]
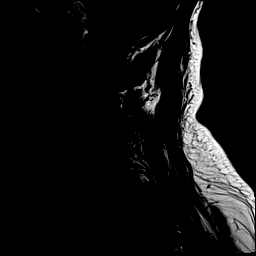
[im 12/15]
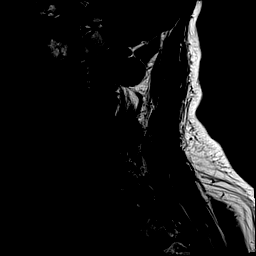
[im 15/15]
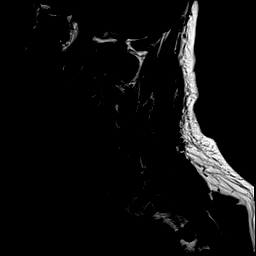

[Series 7: STIR · sagittal · 3.0mm · 0.62mm/px · 7 of 15 slices shown]
[im 1/15]
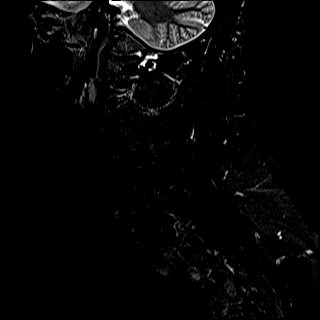
[im 3/15]
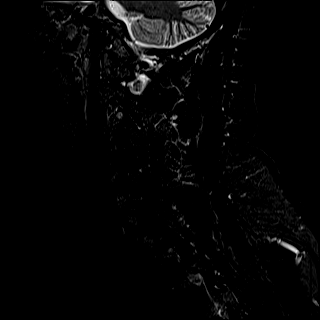
[im 5/15]
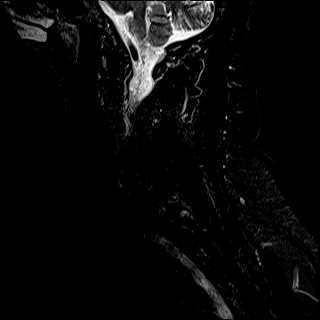
[im 8/15]
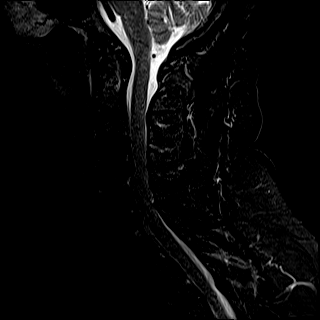
[im 10/15]
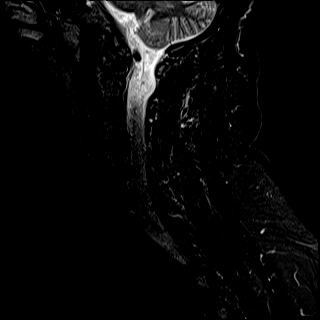
[im 12/15]
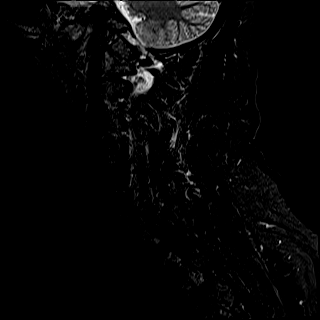
[im 15/15]
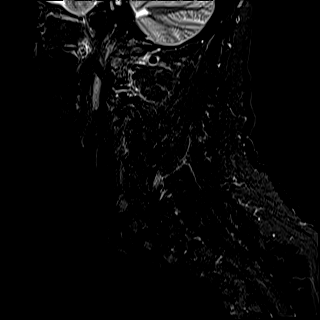

[Series 8: T2 · axial · 3.0mm · 0.70mm/px · z∈[-126,-22]mm · 8 of 31 slices shown (2 of 2)]
[im 1/31]
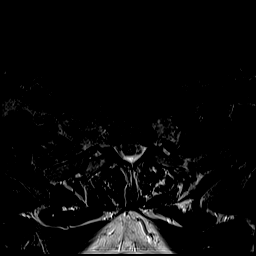
[im 5/31]
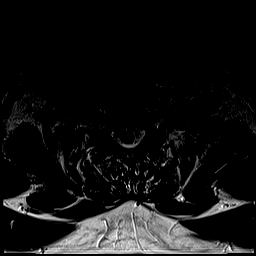
[im 10/31]
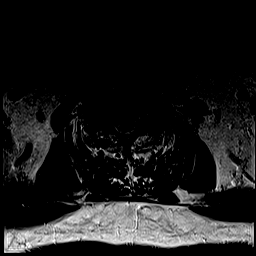
[im 14/31]
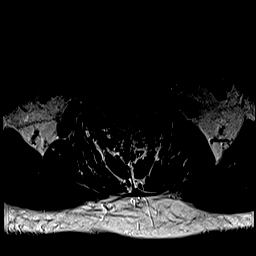
[im 17/31]
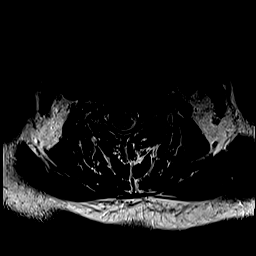
[im 21/31]
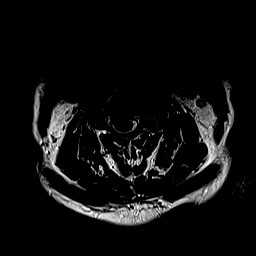
[im 26/31]
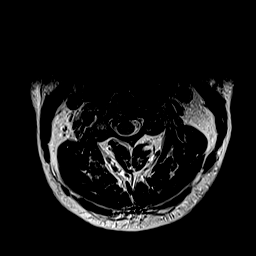
[im 31/31]
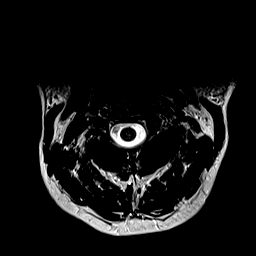

[Series 9: ax mpgr · axial · 3.0mm · 0.35mm/px · z∈[-126,-39]mm · 7 of 31 slices shown]
[im 1/31]
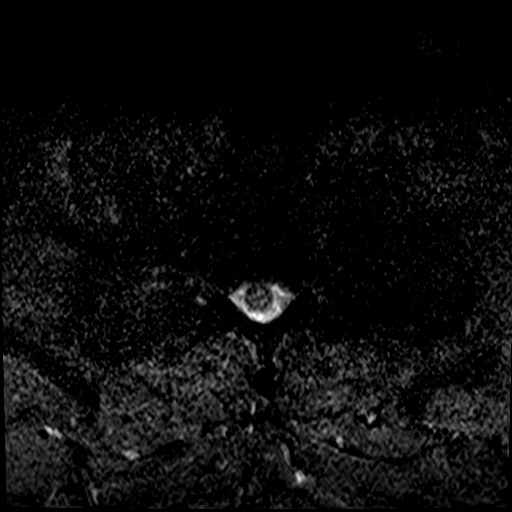
[im 5/31]
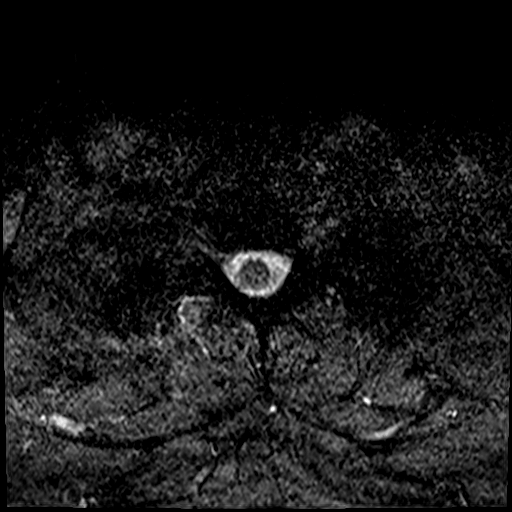
[im 10/31]
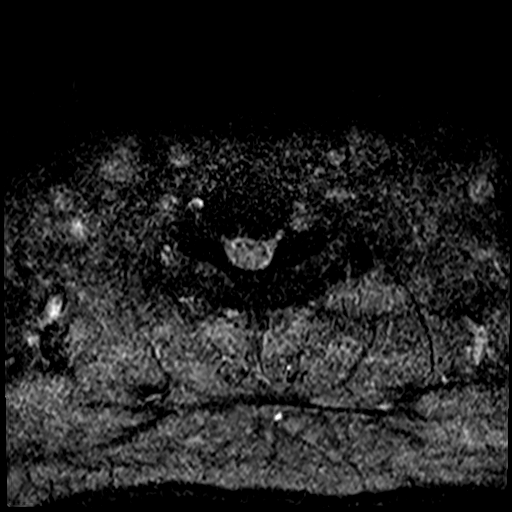
[im 14/31]
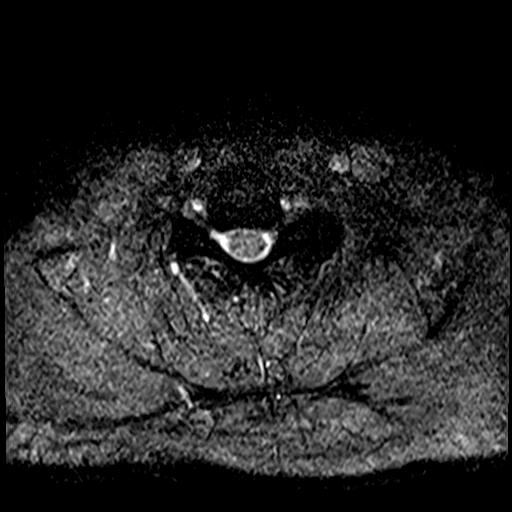
[im 17/31]
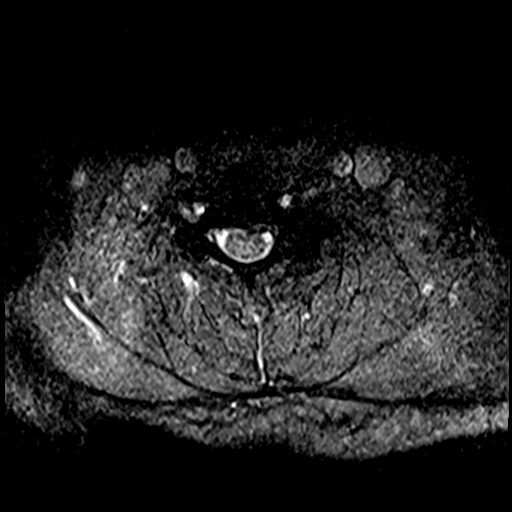
[im 21/31]
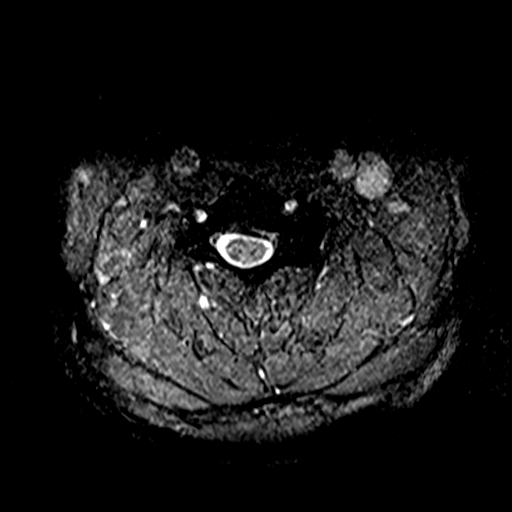
[im 26/31]
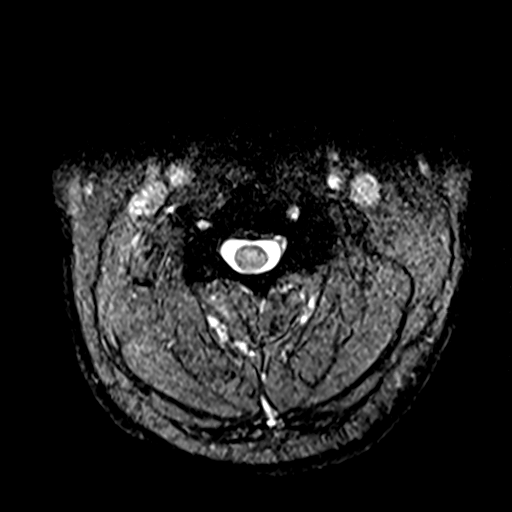

[35 of 48 positions shown; findings below may reference images not displayed]

FINDINGS: Alignment: Straightening of the normal cervical lordosis. Trace
anterolisthesis of C3 on C4, C7 on T1, and T1 on T2, chronic and
degenerative.

Vertebrae: Vertebral body height maintained without acute or chronic
fracture. Bone marrow signal intensity within normal limits. No
discrete or worrisome osseous lesions. Mild reactive marrow edema
noted within the dense, presumably degenerative in nature. No other
abnormal marrow edema.

Cord: Punctate foci of T2 signal abnormality involving the dorsal
cord at the level of C4-5, most characteristic of chronic
myelomalacia (series 8, image 13). Signal intensity within the
cervical spinal cord otherwise within normal limits.

Posterior Fossa, vertebral arteries, paraspinal tissues: Visualized
brain and posterior fossa within normal limits. Craniocervical
junction normal. Paraspinous and prevertebral soft tissues within
normal limits. Normal flow voids seen within the vertebral arteries
bilaterally.

Disc levels:

C2-C3: Minimal disc bulge with right greater than left uncovertebral
hypertrophy. Superimposed right worse than left facet degeneration.
No significant spinal stenosis. Moderate right C3 foraminal
narrowing. Left neural foramina remains patent.

C3-C4: Degenerative intervertebral disc space narrowing with mild
diffuse disc bulge and bilateral uncovertebral hypertrophy. Severe
left-sided facet arthrosis with associated ankylosis. Posterior disc
osteophyte flattens and partially effaces the ventral thecal sac,
slightly eccentric to the left. Mild spinal stenosis without cord
impingement. Moderate left with mild right C4 foraminal stenosis.

C4-C5: Degenerative intervertebral disc space narrowing. Broad-based
left paracentral disc osteophyte complex flattens and effaces the
ventral thecal sac. Associated flattening of the left hemi cord
(series 8, image 13). Two small subtle foci of chronic myelomalacia
as above. Moderate spinal stenosis, greater on the left.
Superimposed severe left with mild right facet hypertrophy.
Resultant severe left with moderate right C5 foraminal stenosis.

C5-C6: Mild disc bulge with uncovertebral hypertrophy. Moderate left
with mild right facet degeneration. Flattening and partial
effacement of the ventral thecal sac with resultant mild spinal
stenosis. Moderate left worse than right C6 foraminal narrowing.

C6-C7: Degenerative intervertebral disc space narrowing with diffuse
disc osteophyte complex. Superimposed bilateral facet hypertrophy.
Broad posterior disc osteophyte effaces the ventral thecal sac,
greater on the left. Mild to moderate spinal stenosis with severe
bilateral C7 foraminal narrowing.

C7-T1: Negative interspace. Bilateral facet hypertrophy. No spinal
stenosis. Foramina remain patent.

Visualized upper thoracic spine demonstrates mild noncompressive
disc bulging at T1-2 without significant stenosis.
IMPRESSION: 1. Multilevel cervical spondylosis with resultant mild to moderate
diffuse spinal stenosis at C3-4 through C6-7, most pronounced at
C4-5.
2. Punctate foci of T2 signal abnormality involving the dorsal cord
at the level of C4-5, consistent with chronic myelomalacia. Findings
could contribute to peripheral numbness and tingling.
3. Multifactorial degenerative changes with resultant multilevel
foraminal narrowing as above. Notable findings include moderate
right C3 and left C4 foraminal stenosis, severe left with moderate
right C5 foraminal narrowing, moderate left worse than right C6
foraminal stenosis, with severe bilateral C7 foraminal narrowing.

## 2020-10-08 NOTE — Progress Notes (Signed)
  Perioperative Services Pre-Admission/Anesthesia Testing     Date: 10/08/20  Name: Tom Barrett MRN:   [id number]  Re: Change in [place] for upcoming surgery   Case: [id number] Date/Time: 10/09/20 0715   Procedure: LEFT CARPAL TUNNEL RELEASE ENDOSCOPIC WITH SUBCUTANEOUS ANTERIOR TRANSPOSITION OF ULNER NERVE AT LEFT ELBOW. (Left: Wrist)   Anesthesia type: Choice   Pre-op diagnosis:      Carpal tunnel syndrome, left G56.02     Cubital tunnel syndrome, left G56.22   Location: [facility] OR ROOM 02 / [place] [facility]   Surgeons: Corky Mull, MD     Primary attending surgeon was consulted regarding consideration of therapeutic change in antimicrobial agent being used for preoperative prophylaxis in this patient's upcoming surgical case. Following analysis of the risk versus benefits, Dr. Roland Rack, Marshall Cork, MD advising that it would be acceptable to discontinue the ordered clindamycin and place an order for cefazolin 2 gm IV on call to the OR. Orders for this patient were amended by me following collaborative conversation with attending surgeon.  Honor Loh, MSN, APRN, FNP-C, CEN [facility]  Peri-operative Services Nurse Practitioner Phone: [phone] 10/08/20 1:20 PM

## 2020-10-09 ENCOUNTER — Other Ambulatory Visit: Payer: Self-pay

## 2020-10-09 ENCOUNTER — Encounter
Admission: RE | Disposition: A | Discharge status: Home / Self Care | Payer: Self-pay | Source: Home / Self Care | Attending: Surgery

## 2020-10-09 ENCOUNTER — Ambulatory Visit: Payer: BC Managed Care – PPO | Admitting: Urgent Care

## 2020-10-09 ENCOUNTER — Encounter: Payer: Self-pay | Admitting: Surgery

## 2020-10-09 ENCOUNTER — Ambulatory Visit
Admission: RE | Admit: 2020-10-09 | Discharge: 2020-10-09 | Disposition: A | Discharge status: Home / Self Care | Payer: BC Managed Care – PPO | Attending: Surgery | Admitting: Surgery

## 2020-10-09 DIAGNOSIS — Z79899 Other long term (current) drug therapy: Secondary | ICD-10-CM | POA: Insufficient documentation

## 2020-10-09 DIAGNOSIS — Z79890 Hormone replacement therapy (postmenopausal): Secondary | ICD-10-CM | POA: Insufficient documentation

## 2020-10-09 DIAGNOSIS — Z888 Allergy status to other drugs, medicaments and biological substances status: Secondary | ICD-10-CM | POA: Insufficient documentation

## 2020-10-09 DIAGNOSIS — G5622 Lesion of ulnar nerve, left upper limb: Secondary | ICD-10-CM | POA: Diagnosis not present

## 2020-10-09 DIAGNOSIS — G5602 Carpal tunnel syndrome, left upper limb: Secondary | ICD-10-CM | POA: Diagnosis not present

## 2020-10-09 DIAGNOSIS — Z880 Allergy status to penicillin: Secondary | ICD-10-CM | POA: Diagnosis not present

## 2020-10-09 DIAGNOSIS — Z87891 Personal history of nicotine dependence: Secondary | ICD-10-CM | POA: Diagnosis not present

## 2020-10-09 HISTORY — PX: CARPAL TUNNEL RELEASE: SHX101

## 2020-10-09 SURGERY — RELEASE, CARPAL TUNNEL, ENDOSCOPIC
Anesthesia: General | Site: Wrist | Laterality: Left

## 2020-10-09 MED ORDER — ONDANSETRON HCL 4 MG/2ML IJ SOLN
INTRAMUSCULAR | Status: DC | PRN
  Administered 2020-10-09: 4 mg via INTRAVENOUS

## 2020-10-09 MED ORDER — FENTANYL CITRATE (PF) 100 MCG/2ML IJ SOLN: 25.0000 ug | INTRAMUSCULAR | Status: DC | PRN

## 2020-10-09 MED ORDER — DEXMEDETOMIDINE HCL IN NACL 200 MCG/50ML IV SOLN
INTRAVENOUS | Status: AC
  Filled 2020-10-09: qty 50

## 2020-10-09 MED ORDER — DEXMEDETOMIDINE (PRECEDEX) IN NS 20 MCG/5ML (4 MCG/ML) IV SYRINGE
PREFILLED_SYRINGE | INTRAVENOUS | Status: DC | PRN
  Administered 2020-10-09: 12 ug via INTRAVENOUS

## 2020-10-09 MED ORDER — CEFAZOLIN SODIUM-DEXTROSE 2-4 GM/100ML-% IV SOLN
2.0000 g | Freq: Once | INTRAVENOUS | Status: AC
  Administered 2020-10-09: 2 g via INTRAVENOUS

## 2020-10-09 MED ORDER — DEXAMETHASONE SODIUM PHOSPHATE 10 MG/ML IJ SOLN
INTRAMUSCULAR | Status: AC
  Filled 2020-10-09: qty 1

## 2020-10-09 MED ORDER — FAMOTIDINE 20 MG PO TABS
ORAL_TABLET | ORAL | Status: AC
  Administered 2020-10-09: 20 mg via ORAL
  Filled 2020-10-09: qty 1

## 2020-10-09 MED ORDER — ONDANSETRON HCL 4 MG PO TABS: 4.0000 mg | ORAL_TABLET | Freq: Four times a day (QID) | ORAL | Status: DC | PRN

## 2020-10-09 MED ORDER — ACETAMINOPHEN 10 MG/ML IV SOLN: 1000.0000 mg | Freq: Once | INTRAVENOUS | Status: DC | PRN

## 2020-10-09 MED ORDER — EPHEDRINE 5 MG/ML INJ
INTRAVENOUS | Status: AC
  Filled 2020-10-09: qty 5

## 2020-10-09 MED ORDER — METOCLOPRAMIDE HCL 5 MG/ML IJ SOLN: 5.0000 mg | Freq: Three times a day (TID) | INTRAMUSCULAR | Status: DC | PRN

## 2020-10-09 MED ORDER — MIDAZOLAM HCL 2 MG/2ML IJ SOLN
INTRAMUSCULAR | Status: AC
  Filled 2020-10-09: qty 2

## 2020-10-09 MED ORDER — ONDANSETRON HCL 4 MG/2ML IJ SOLN: 4.0000 mg | Freq: Four times a day (QID) | INTRAMUSCULAR | Status: DC | PRN

## 2020-10-09 MED ORDER — BUPIVACAINE HCL (PF) 0.5 % IJ SOLN
INTRAMUSCULAR | Status: AC
  Filled 2020-10-09: qty 30

## 2020-10-09 MED ORDER — LIDOCAINE HCL (CARDIAC) PF 100 MG/5ML IV SOSY
PREFILLED_SYRINGE | INTRAVENOUS | Status: DC | PRN
  Administered 2020-10-09: 100 mg via INTRAVENOUS

## 2020-10-09 MED ORDER — FENTANYL CITRATE (PF) 100 MCG/2ML IJ SOLN
INTRAMUSCULAR | Status: AC
  Filled 2020-10-09: qty 2

## 2020-10-09 MED ORDER — ONDANSETRON HCL 4 MG/2ML IJ SOLN: 4.0000 mg | Freq: Once | INTRAMUSCULAR | Status: DC | PRN

## 2020-10-09 MED ORDER — ORAL CARE MOUTH RINSE: 15.0000 mL | Freq: Once | OROMUCOSAL | Status: AC

## 2020-10-09 MED ORDER — SODIUM CHLORIDE 0.9 % IV SOLN
INTRAVENOUS | Status: DC | PRN
  Administered 2020-10-09: 60 ug/min via INTRAVENOUS

## 2020-10-09 MED ORDER — PROPOFOL 10 MG/ML IV BOLUS
INTRAVENOUS | Status: DC | PRN
  Administered 2020-10-09: 50 mg via INTRAVENOUS
  Administered 2020-10-09: 200 mg via INTRAVENOUS
  Administered 2020-10-09: 50 mg via INTRAVENOUS

## 2020-10-09 MED ORDER — PROPOFOL 10 MG/ML IV BOLUS
INTRAVENOUS | Status: AC
  Filled 2020-10-09: qty 20

## 2020-10-09 MED ORDER — FAMOTIDINE 20 MG PO TABS: 20.0000 mg | ORAL_TABLET | Freq: Once | ORAL | Status: AC

## 2020-10-09 MED ORDER — PHENYLEPHRINE HCL (PRESSORS) 10 MG/ML IV SOLN
INTRAVENOUS | Status: AC
  Filled 2020-10-09: qty 1

## 2020-10-09 MED ORDER — CHLORHEXIDINE GLUCONATE 0.12 % MT SOLN: 15.0000 mL | Freq: Once | OROMUCOSAL | Status: AC

## 2020-10-09 MED ORDER — OXYCODONE HCL 5 MG/5ML PO SOLN: 5.0000 mg | Freq: Once | ORAL | Status: DC | PRN

## 2020-10-09 MED ORDER — METOCLOPRAMIDE HCL 10 MG PO TABS: 5.0000 mg | ORAL_TABLET | Freq: Three times a day (TID) | ORAL | Status: DC | PRN

## 2020-10-09 MED ORDER — MIDAZOLAM HCL 2 MG/2ML IJ SOLN
INTRAMUSCULAR | Status: DC | PRN
  Administered 2020-10-09 (×2): 1 mg via INTRAVENOUS

## 2020-10-09 MED ORDER — KETOROLAC TROMETHAMINE 30 MG/ML IJ SOLN
INTRAMUSCULAR | Status: DC | PRN
  Administered 2020-10-09: 30 mg via INTRAVENOUS

## 2020-10-09 MED ORDER — GLYCOPYRROLATE 0.2 MG/ML IJ SOLN
INTRAMUSCULAR | Status: AC
  Filled 2020-10-09: qty 1

## 2020-10-09 MED ORDER — ACETAMINOPHEN 10 MG/ML IV SOLN
INTRAVENOUS | Status: AC
  Filled 2020-10-09: qty 100

## 2020-10-09 MED ORDER — PHENYLEPHRINE HCL (PRESSORS) 10 MG/ML IV SOLN
INTRAVENOUS | Status: DC | PRN
  Administered 2020-10-09: 100 ug via INTRAVENOUS
  Administered 2020-10-09: 200 ug via INTRAVENOUS
  Administered 2020-10-09: 100 ug via INTRAVENOUS
  Administered 2020-10-09: 200 ug via INTRAVENOUS

## 2020-10-09 MED ORDER — ACETAMINOPHEN 10 MG/ML IV SOLN
INTRAVENOUS | Status: DC | PRN
  Administered 2020-10-09: 1000 mg via INTRAVENOUS

## 2020-10-09 MED ORDER — OXYCODONE HCL 5 MG PO TABS: 5.0000 mg | ORAL_TABLET | Freq: Once | ORAL | Status: DC | PRN

## 2020-10-09 MED ORDER — 0.9 % SODIUM CHLORIDE (POUR BTL) OPTIME
TOPICAL | Status: DC | PRN
  Administered 2020-10-09: 400 mL

## 2020-10-09 MED ORDER — LACTATED RINGERS IV SOLN: INTRAVENOUS | Status: DC

## 2020-10-09 MED ORDER — BUPIVACAINE HCL (PF) 0.5 % IJ SOLN
INTRAMUSCULAR | Status: DC | PRN
  Administered 2020-10-09: 30 mL

## 2020-10-09 MED ORDER — EPHEDRINE SULFATE 50 MG/ML IJ SOLN
INTRAMUSCULAR | Status: DC | PRN
  Administered 2020-10-09: 10 mg via INTRAVENOUS
  Administered 2020-10-09 (×3): 5 mg via INTRAVENOUS
  Administered 2020-10-09: 10 mg via INTRAVENOUS
  Administered 2020-10-09: 15 mg via INTRAVENOUS

## 2020-10-09 MED ORDER — CHLORHEXIDINE GLUCONATE 0.12 % MT SOLN
OROMUCOSAL | Status: AC
  Administered 2020-10-09: 15 mL via OROMUCOSAL
  Filled 2020-10-09: qty 15

## 2020-10-09 MED ORDER — FENTANYL CITRATE (PF) 100 MCG/2ML IJ SOLN
INTRAMUSCULAR | Status: DC | PRN
  Administered 2020-10-09 (×2): 50 ug via INTRAVENOUS

## 2020-10-09 MED ORDER — LIDOCAINE HCL (PF) 2 % IJ SOLN
INTRAMUSCULAR | Status: AC
  Filled 2020-10-09: qty 5

## 2020-10-09 MED ORDER — CEFAZOLIN SODIUM-DEXTROSE 2-4 GM/100ML-% IV SOLN
INTRAVENOUS | Status: AC
  Filled 2020-10-09: qty 100

## 2020-10-09 MED ORDER — ONDANSETRON HCL 4 MG/2ML IJ SOLN
INTRAMUSCULAR | Status: AC
  Filled 2020-10-09: qty 2

## 2020-10-09 MED ORDER — DEXAMETHASONE SODIUM PHOSPHATE 10 MG/ML IJ SOLN
INTRAMUSCULAR | Status: DC | PRN
  Administered 2020-10-09: 10 mg via INTRAVENOUS

## 2020-10-09 MED ORDER — HYDROCODONE-ACETAMINOPHEN 5-325 MG PO TABS: 1.0000 | ORAL_TABLET | ORAL | Status: DC | PRN

## 2020-10-09 SURGICAL SUPPLY — 42 items
APL PRP STRL LF DISP 70% ISPRP (MISCELLANEOUS) ×1
APL SKNCLS STERI-STRIP NONHPOA (GAUZE/BANDAGES/DRESSINGS) ×1
BENZOIN TINCTURE PRP APPL 2/3 (GAUZE/BANDAGES/DRESSINGS) ×2 IMPLANT
BNDG COHESIVE 4X5 TAN ST LF (GAUZE/BANDAGES/DRESSINGS) ×2 IMPLANT
BNDG ELASTIC 2X5.8 VLCR STR LF (GAUZE/BANDAGES/DRESSINGS) ×2 IMPLANT
BNDG ELASTIC 4X5.8 VLCR STR LF (GAUZE/BANDAGES/DRESSINGS) ×2 IMPLANT
BNDG ESMARK 4X12 TAN STRL LF (GAUZE/BANDAGES/DRESSINGS) ×2 IMPLANT
CANISTER SUCT 1200ML W/VALVE (MISCELLANEOUS) IMPLANT
CHLORAPREP W/TINT 26 (MISCELLANEOUS) ×2 IMPLANT
CORD BIP STRL DISP 12FT (MISCELLANEOUS) ×2 IMPLANT
CUFF TOURN SGL QUICK 18X4 (TOURNIQUET CUFF) ×2 IMPLANT
DRAPE SURG 17X11 SM STRL (DRAPES) ×2 IMPLANT
FORCEPS JEWEL BIP 4-3/4 STR (INSTRUMENTS) ×2 IMPLANT
GAUZE 4X4 16PLY ~~LOC~~+RFID DBL (SPONGE) ×2 IMPLANT
GAUZE SPONGE 4X4 12PLY STRL (GAUZE/BANDAGES/DRESSINGS) ×2 IMPLANT
GAUZE XEROFORM 1X8 LF (GAUZE/BANDAGES/DRESSINGS) ×2 IMPLANT
GLOVE SURG ENC MOIS LTX SZ8 (GLOVE) ×2 IMPLANT
GLOVE SURG UNDER LTX SZ8 (GLOVE) ×2 IMPLANT
GOWN STRL REUS W/ TWL LRG LVL3 (GOWN DISPOSABLE) ×2 IMPLANT
GOWN STRL REUS W/ TWL XL LVL3 (GOWN DISPOSABLE) ×1 IMPLANT
GOWN STRL REUS W/TWL LRG LVL3 (GOWN DISPOSABLE) ×4
GOWN STRL REUS W/TWL XL LVL3 (GOWN DISPOSABLE) ×2
KIT CARPAL TUNNEL (MISCELLANEOUS) ×2
KIT ESCP INSRT D SLOT CANN KN (MISCELLANEOUS) ×1 IMPLANT
KIT TURNOVER KIT A (KITS) ×2 IMPLANT
LOOP RED MAXI  1X406MM (MISCELLANEOUS) ×1
LOOP VESSEL MAXI 1X406 RED (MISCELLANEOUS) ×1 IMPLANT
MANIFOLD NEPTUNE II (INSTRUMENTS) ×2 IMPLANT
NS IRRIG 500ML POUR BTL (IV SOLUTION) ×2 IMPLANT
PACK EXTREMITY ARMC (MISCELLANEOUS) ×2 IMPLANT
SPLINT WRIST LG LT TX990309 (SOFTGOODS) IMPLANT
SPLINT WRIST LG RT TX900304 (SOFTGOODS) IMPLANT
SPLINT WRIST M LT TX990308 (SOFTGOODS) IMPLANT
SPLINT WRIST M RT TX990303 (SOFTGOODS) IMPLANT
SPLINT WRIST XL LT TX990310 (SOFTGOODS) ×2 IMPLANT
SPLINT WRIST XL RT TX990305 (SOFTGOODS) IMPLANT
STOCKINETTE IMPERVIOUS 9X36 MD (GAUZE/BANDAGES/DRESSINGS) ×2 IMPLANT
STRIP CLOSURE SKIN 1/4X4 (GAUZE/BANDAGES/DRESSINGS) ×2 IMPLANT
SUT PROLENE 4 0 PS 2 18 (SUTURE) ×2 IMPLANT
SUT VIC AB 2-0 CT1 (SUTURE) ×4 IMPLANT
SUT VIC AB 3-0 SH 27 (SUTURE) ×2
SUT VIC AB 3-0 SH 27X BRD (SUTURE) ×1 IMPLANT

## 2020-10-09 NOTE — Transfer of Care (Signed)
Immediate Anesthesia Transfer of Care Note  Patient: Tom Barrett  Procedure(s) Performed: LEFT CARPAL TUNNEL RELEASE ENDOSCOPIC WITH SUBCUTANEOUS ANTERIOR TRANSPOSITION OF ULNER NERVE AT LEFT ELBOW. (Left: Wrist)  Patient Location: PACU  Anesthesia Type:General  Level of Consciousness: sedated  Airway & Oxygen Therapy: Patient Spontanous Breathing and Patient connected to face mask oxygen  Post-op Assessment: Report given to RN and Post -op Vital signs reviewed and stable  Post vital signs: Reviewed and stable  Last Vitals:  Vitals Value Taken Time  BP 125/70 10/09/20 0932  Temp 36.6 C 10/09/20 0932  Pulse 73 10/09/20 0937  Resp 14 10/09/20 0937  SpO2 99 % 10/09/20 0937  Vitals shown include unvalidated device data.  Last Pain:  Vitals:   10/09/20 0616  TempSrc: Temporal  PainSc: 3          Complications: No notable events documented.

## 2020-10-09 NOTE — Anesthesia Procedure Notes (Signed)
Procedure Name: LMA Insertion Date/Time: 10/09/2020 7:37 AM Performed by: Johnna Acosta, CRNA Pre-anesthesia Checklist: Patient identified, Emergency Drugs available, Suction available, Patient being monitored and Timeout performed Patient Re-evaluated:Patient Re-evaluated prior to induction Oxygen Delivery Method: Circle system utilized Preoxygenation: Pre-oxygenation with 100% oxygen Induction Type: IV induction LMA: LMA inserted LMA Size: 5.0 Tube type: Oral Number of attempts: 2 Placement Confirmation: positive ETCO2 and breath sounds checked- equal and bilateral Tube secured with: Tape Dental Injury: Teeth and Oropharynx as per pre-operative assessment

## 2020-10-09 NOTE — Anesthesia Postprocedure Evaluation (Signed)
Anesthesia Post Note  Patient: Tom Barrett  Procedure(s) Performed: LEFT CARPAL TUNNEL RELEASE ENDOSCOPIC WITH SUBCUTANEOUS ANTERIOR TRANSPOSITION OF ULNER NERVE AT LEFT ELBOW. (Left: Wrist)  Patient location during evaluation: PACU Anesthesia Type: General Level of consciousness: awake and alert Pain management: pain level controlled Vital Signs Assessment: post-procedure vital signs reviewed and stable Respiratory status: spontaneous breathing, nonlabored ventilation, respiratory function stable and patient connected to nasal cannula oxygen Cardiovascular status: blood pressure returned to baseline and stable Postop Assessment: no apparent nausea or vomiting Anesthetic complications: no   No notable events documented.   Last Vitals:  Vitals:   10/09/20 1008 10/09/20 1024  BP: (!) 141/82 (!) 147/70  Pulse: 77 87  Resp: 17 16  Temp: 36.6 C (!) 36.2 C  SpO2: 94% 98%    Last Pain:  Vitals:   10/09/20 1024  TempSrc: Temporal  PainSc: 4                  Arita Miss

## 2020-10-09 NOTE — Discharge Instructions (Addendum)
Orthopedic discharge instructions: Keep dressing dry and intact. Keep arm elevated above heart level. May shower after dressing removed on postop day 4 (Sunday). Cover sutures with Band-Aids after drying off, then reapply Velcro splint. Apply ice to affected area frequently. Take ibuprofen 600-800 mg TID with meals for 7-10 days, then as necessary. Take ES Tylenol when needed.  Return for follow-up in 10-14 days or as scheduled.  AMBULATORY SURGERY  DISCHARGE INSTRUCTIONS   The drugs that you were given will stay in your system until tomorrow so for the next 24 hours you should not:  Drive an automobile Make any legal decisions Drink any alcoholic beverage   You may resume regular meals tomorrow.  Today it is better to start with liquids and gradually work up to solid foods.  You may eat anything you prefer, but it is better to start with liquids, then soup and crackers, and gradually work up to solid foods.   Please notify your doctor immediately if you have any unusual bleeding, trouble breathing, redness and pain at the surgery site, drainage, fever, or pain not relieved by medication.    Additional Instructions:        Please contact your physician with any problems or Same Day Surgery at [phone], Monday through Friday 6 am to 4 pm, or [place] at [facility] number at [phone].

## 2020-10-09 NOTE — Op Note (Signed)
10/09/2020  9:41 AM  Patient:   Tom Barrett  Pre-Op Diagnosis:   1.  Left carpal tunnel syndrome.  2.  Left cubital tunnel syndrome.  Post-Op Diagnosis:   Same.  Procedure:   1.  Endoscopic left carpal tunnel release.  2.  Anterior subcutaneous transposition of ulnar nerve, left elbow.  Surgeon:   Pascal Lux, MD  Assistant:   Samson Frederic, PA-S  Anesthesia:   General LMA  Findings:   As above.  Complications:   None  EBL:   2 cc  Fluids:   1100 cc crystalloid  TT:   85 minutes at 250 mmHg  Drains:   None  Closure:   4-0 Prolene interrupted sutures for wrist and 3-0 Vicryl subcuticular sutures for elbow  Brief Clinical Note:   The patient is a a 60 year old male with a history of progressively worsening pain and paresthesias to all digits of his left hand.  His symptoms have progressed despite medications, activity modification, etc.  His history and examination are consistent with both left carpal tunnel syndrome and left cubital tunnel syndrome, the latter of which was confirmed by EMG.  The patient presents at this time for an endoscopic left carpal tunnel release with a subcutaneous anterior transposition of the ulnar nerve at the left elbow.  Procedure:   The patient was brought into the operating room and lain in the supine position. After adequate general laryngeal mask anesthesia was obtained, the left hand and upper extremity were prepped with ChloraPrep solution before being draped sterilely. Preoperative antibiotics were administered. A timeout was performed to verify the appropriate surgical site before the limb was exsanguinated with an Esmarch and the tourniquet inflated to 250 mmHg. An approximately 1.5-2 cm incision was made over the volar wrist flexion crease, centered over the palmaris longus tendon. The incision was carried down through the subcutaneous tissues with care taken to identify and protect any neurovascular structures. The distal forearm fascia  was penetrated just proximal to the transverse carpal ligament. The soft tissues were released off the superficial and deep surfaces of the distal forearm fascia and this was released proximally for 3-4 cm under direct visualization.  Attention was directed distally. The Soil scientist was passed beneath the transverse carpal ligament along the ulnar aspect of the carpal tunnel and used to release any adhesions as well as to remove any adherent synovial tissue before first the smaller then the larger of the two dilators were passed beneath the transverse carpal ligament along the ulnar margin of the carpal tunnel. The slotted cannula was introduced and the endoscope was placed into the slotted cannula and the undersurface of the transverse carpal ligament visualized. The distal margin of the transverse carpal ligament was marked by placing a 25-gauge needle percutaneously at Lyons cardinal point so that it entered the distal portion of the slotted cannula. Under endoscopic visualization, the transverse carpal ligament was released from proximal to distal using the end-cutting blade. A second pass was performed to ensure complete release of the ligament. The adequacy of release was verified both endoscopically and by palpation using the freer elevator.  Next, the elbow was addressed.  An approximately 7-8 cm curvilinear incision was made along the course of the ulnar nerve posterior to the medial epicondyle. The incision was carried down through the subcutaneous tissues with care taken to avoid the small branches of the medial antebrachial nerve to expose the sheath overlying the cubital tunnel.   The ulnar nerve was identified  at the proximal end of the tunnel and was dissected free. The nerve was then carefully followed as the roof of the cubital tunnel was released from proximal to distal. Distally, the fascia overlying the pronator muscle was released for several centimeters. The nerve was clearly  thickened as it exited the cubital tunnel just proximal to where it dove beneath the pronator fascia, indicating the most likely source of the nerve compression. A vessel loop was passed around the nerve and used to provide gentle traction on the nerve while circumferential dissection was carried out under loupe magnification using bipolar electrocautery and tenotomy scissors.   Once the nerve was fully mobilized, the anterior tissues were elevated as a flap just superficial to the fascia overlying the common flexor origin and a pocket created to accept the nerve. Care was taken to be sure that there was no undue tension along the nerve either proximally or distally. The nerve was carefully retracted while several 2-0 Vicryl interrupted sutures were placed to reapproximate the flap to the medial epicondylar soft tissues, thereby creating a "sling" for the ulnar nerve. The cubital tunnel itself was reapproximated using several 2-0 Vicryl interrupted sutures in order to prevent the nerve from falling back into the cubital tunnel.  The elbow wound was copiously irrigated with sterile saline solution before the subcutaneous tissues were closed using 2-0 Vicryl interrupted sutures. The skin was closed using 3-0 Vicryl subcuticular sutures before benzoin and Steri-Strips were applied to the skin. Distally, the left wrist wound was irrigated thoroughly with sterile saline solution before being closed using 4-0 Prolene interrupted sutures. A total of 30 cc of 0.5% plain Sensorcaine was injected in and around the two incisions before sterile bulky dressings were applied over each wound. The patient was placed into a volar wrist splint before being awakened, extubated, and returned to the recovery room in satisfactory condition after tolerating the procedure well.

## 2020-10-09 NOTE — H&P (Signed)
History of Present Illness:  Tom Barrett is a 60 y.o. male who presents for evaluation and treatment of gradually worsening left hand and forearm pain and paresthesias. His recent EMG also confirmed the presence of a moderately severe left cubital tunnel syndrome, and his examination findings have suggested associated carpal tunnel syndrome. The symptoms have persisted despite activity modification, medications, etc. He denies any reinjury to the hand or wrist. He would like to consider surgical intervention for his left upper extremity at this time.  Current Outpatient Medications:  diltiazem (CARDIZEM CD) 240 MG CD capsule Take 350 mg by mouth once daily   doxazosin (CARDURA) 8 MG tablet Take 8 mg by mouth nightly.   gabapentin (NEURONTIN) 300 MG capsule 1 capsule (300 mg total) 3 (three) times daily 90 capsule 1   hydroCHLOROthiazide (MICROZIDE) 12.5 mg capsule Take 12.5 mg by mouth once daily   hydrocortisone (ANUSOL-HC) 2.5 % rectal cream Apply topically 3 (three) times daily as needed for Hemorrhoids   ibuprofen (ADVIL,MOTRIN) 200 MG tablet Take 400 mg by mouth every 6 (six) hours as needed   levothyroxine (SYNTHROID, LEVOTHROID) 137 MCG tablet Take 137 mcg by mouth once daily Take 2 TABLETS on an empty stomach with a glass of water at least 30-60 minutes before breakfast.   potassium chloride (MICRO-K) 10 MEQ ER capsule Take 10 mEq by mouth every other day   quinapril (ACCUPRIL) 40 MG tablet Take 40 mg by mouth 2 (two) times daily.   tadalafiL (CIALIS) 5 MG tablet Take 5 mg by mouth once daily as needed   Allergies:   Amlodipine Other (See Comments)   Metoprolol Other (See Comments)   Penicillin Unknown   Past Medical History:   Anxiety   Back pain   Dizziness   Edema   Elevated PSA   Fatigue   Heberden's nodes   History of anemia   HTN (hypertension)   Hyperlipidemia   Hypokalemia   Hypothyroidism   Osteoarthritis   Sleep apnea   Tubular adenoma of colon, unspecified  10/29/2014   Past Surgical History:   COLONOSCOPY 10/29/2014 (Tubular adenoma of colon/Repeat 26yr/MGR)   Endoscopic right carpal tunnel release 06/13/2020 (Dr. PRoland Rack   Left TKA 10/10/2019 (Dr. PRoland Rack   Right total knee arthroplasty 11/09/2017 (Dr. PRoland Rack   Family History:  Problem Relation Age of Onset   No Known Problems Mother   Social History:   Socioeconomic History:   Marital status: Married  Tobacco Use   Smoking status: Former Smoker   Smokeless tobacco: Never Used  VScientific laboratory technicianUse: Never used  Substance and Sexual Activity   Alcohol use: Never   Drug use: Never   Review of Systems:  A comprehensive 14 point ROS was performed, reviewed, and the pertinent orthopaedic findings are documented in the HPI.  Physical Exam: Vitals:  09/13/20 1354  BP: (!) 146/100  Weight: (!) 109.5 kg (241 lb 6.4 oz)  Height: 180.3 cm ('5\' 11"'$ )  PainSc: 3  PainLoc: Wrist   General/Constitutional: The patient appears to be well-nourished, well-developed, and in no acute distress. Neuro/Psych: Normal mood and affect, oriented to person, place and time. Eyes: Non-icteric. Pupils are equal, round, and reactive to light, and exhibit synchronous movement. ENT: Unremarkable. Lymphatic: No palpable adenopathy. Respiratory: Lungs clear to auscultation, Normal chest excursion, No wheezes and Non-labored breathing Cardiovascular: Regular rate and rhythm. No murmurs. and No edema, swelling or tenderness, except as noted in detailed exam. Integumentary: No impressive skin lesions present, except  as noted in detailed exam. Musculoskeletal: Unremarkable, except as noted in detailed exam.  Left elbow exam: Skin inspection of the left elbow remains unremarkable. No swelling, erythema, ecchymosis, abrasions, or other skin abnormalities are identified. He has full active and passive range of motion of the elbow without any pain or catching. There is no elbow effusion. He has no tenderness to  palpation around the elbow, but exhibits a positive Tinel's over the cubital tunnel.   Left hand/wrist exam: Skin inspection of the left hand and wrist again is unremarkable. No swelling, erythema, ecchymosis, abrasions, or other skin abnormalities are identified. He demonstrates full active and passive range of motion of the wrist without any pain or catching. He is able to actively flex and extend all digits fully without any pain or triggering. He is neurovascularly intact to all digits of his left hand, other than some subjectively decreased sensation to light touch to all digits. He has a positive Phalen's test and an equivocally positive Tinel's at the carpal tunnel. He is able to generate 4-4+/5 strength with intrinsic muscle testing.  Assessment: 1. Cubital tunnel syndrome, left  2. Carpal tunnel syndrome, left   Plan: The treatment options were discussed with the patient. In addition, patient educational materials were provided regarding the diagnosis and treatment options. Regarding his left elbow and wrist symptoms, the patient is quite frustrated by these symptoms and functional limitations. He is ready to consider more aggressive treatment options. Therefore, I have recommended a surgical procedure, specifically an endoscopic left carpal tunnel release as well as a subcutaneous anterior transposition of the ulnar nerve at the left elbow. These procedures were discussed with the patient, as were the potential risks (including bleeding, infection, nerve and/or blood vessel injury, persistent or recurrent pain/paresthesias, weakness of grip, need for further surgery, blood clots, strokes, heart attacks and/or arhythmias, pneumonia, etc.) and benefits. The patient states his understanding and wishes to proceed. All of the patient's questions and concerns were answered. He can call any time with further concerns. He will follow up post-surgery, routine.   H&P reviewed and patient re-examined. No  changes.
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
## 2020-10-10 ENCOUNTER — Encounter: Payer: Self-pay | Admitting: Surgery

## 2020-10-14 ENCOUNTER — Ambulatory Visit
Admission: RE | Admit: 2020-10-14 | Discharge: 2020-10-14 | Disposition: A | Discharge status: Home / Self Care | Payer: BC Managed Care – PPO | Source: Ambulatory Visit | Attending: Family Medicine | Admitting: Family Medicine

## 2020-10-14 ENCOUNTER — Other Ambulatory Visit: Payer: Self-pay

## 2020-10-14 DIAGNOSIS — G8929 Other chronic pain: Secondary | ICD-10-CM | POA: Diagnosis present

## 2020-10-14 DIAGNOSIS — M5442 Lumbago with sciatica, left side: Secondary | ICD-10-CM | POA: Insufficient documentation

## 2020-10-14 IMAGING — MR MR LUMBAR SPINE W/O CM
5 series · 31 of 48 positions shown · non-contrast
Comparison: None.

CLINICAL DATA: Lower back pain

EXAM:
MRI LUMBAR SPINE WITHOUT CONTRAST
TECHNIQUE: Multiplanar, multisequence MR imaging of the lumbar spine was
performed. No intravenous contrast was administered.

[Series 5: T2 · sagittal · 4.0mm · 0.81mm/px · 6 of 17 slices shown (1 of 2)]
[im 1/17]
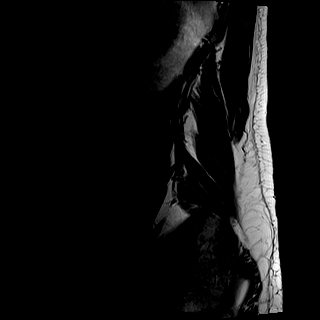
[im 4/17]
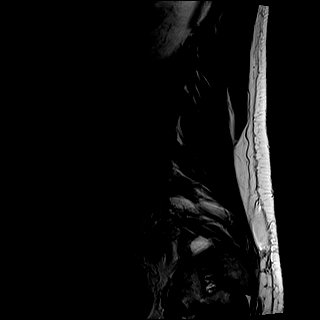
[im 7/17]
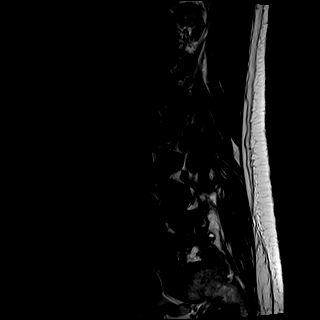
[im 10/17]
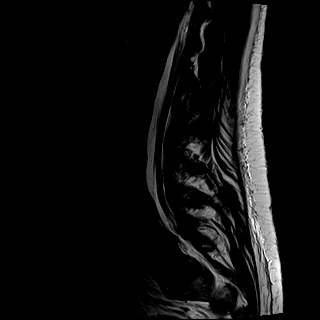
[im 13/17]
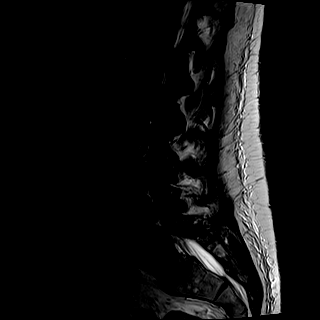
[im 17/17]
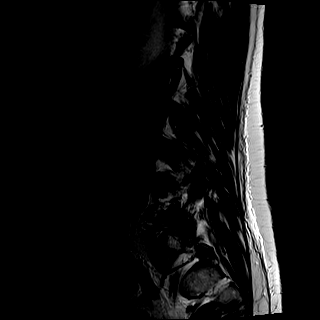

[Series 6: T1 · sagittal · 4.0mm · 0.81mm/px · 6 of 17 slices shown (1 of 2)]
[im 1/17]
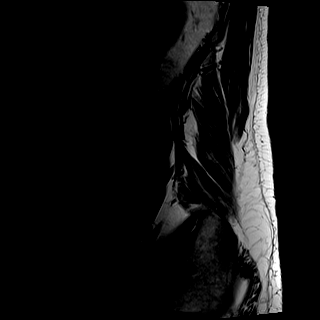
[im 4/17]
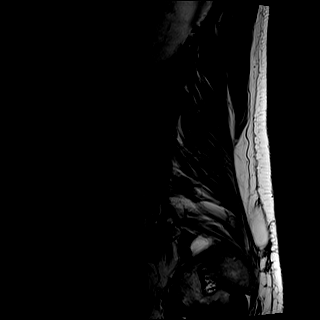
[im 7/17]
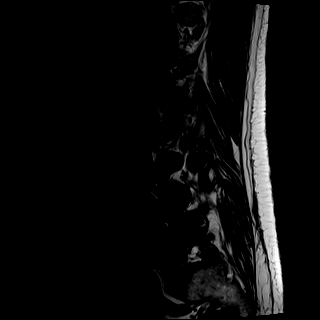
[im 10/17]
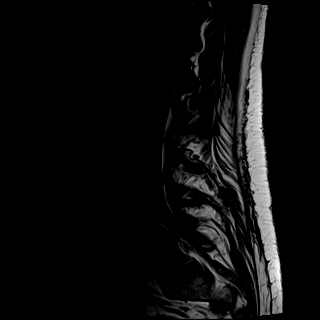
[im 13/17]
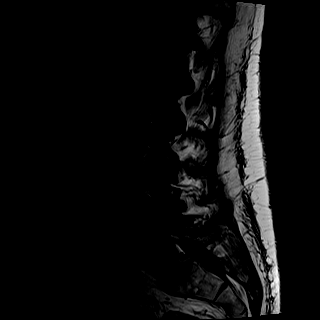
[im 17/17]
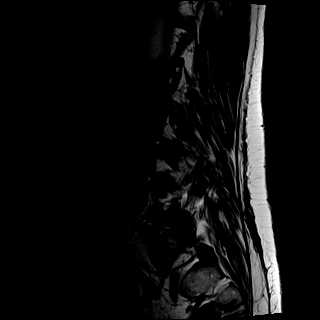

[Series 7: STIR · sagittal · 4.0mm · 0.41mm/px · 1 of 17 slices shown]
[im 1/17]
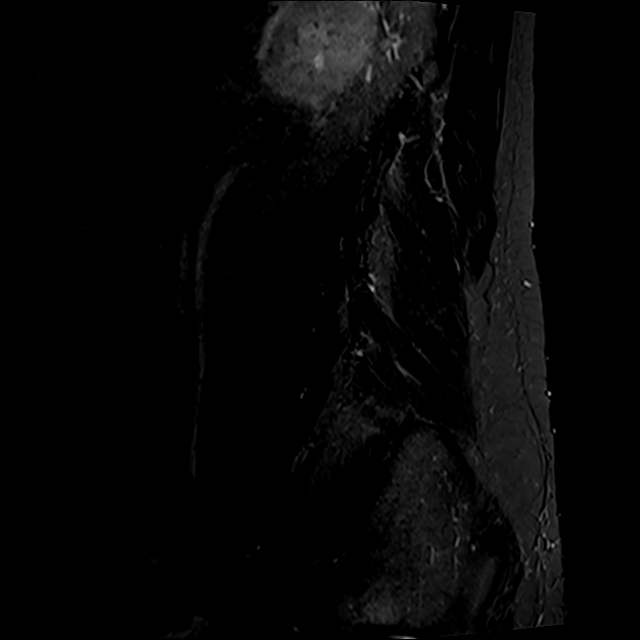

[Series 10: T2 · axial · 4.0mm · 0.78mm/px · z∈[-221,+39]mm · 9 of 44 slices shown (2 of 2)]
[im 1/44]
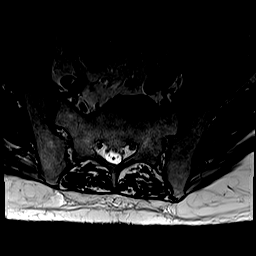
[im 7/44]
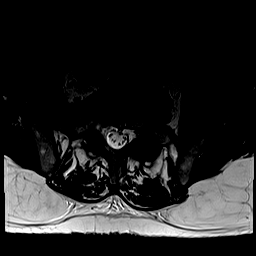
[im 13/44]
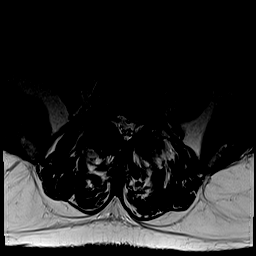
[im 19/44]
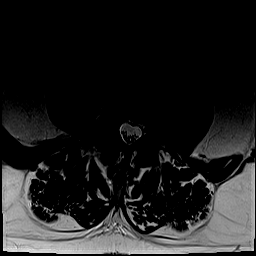
[im 22/44]
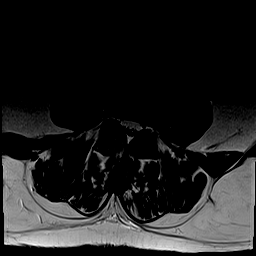
[im 25/44]
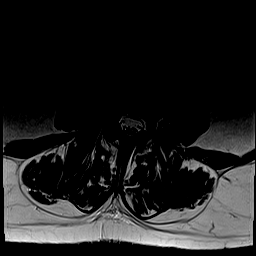
[im 31/44]
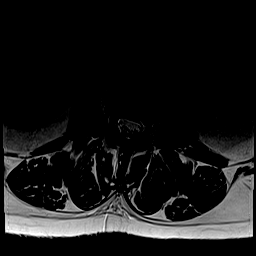
[im 37/44]
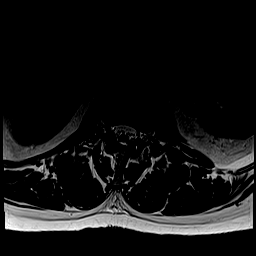
[im 44/44]
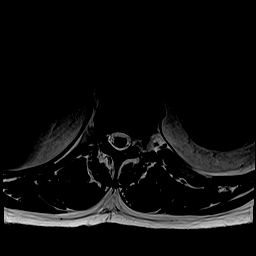

[Series 13: T1 · axial · 4.0mm · 0.39mm/px · z∈[-221,+39]mm · 9 of 44 slices shown (2 of 2)]
[im 1/44]
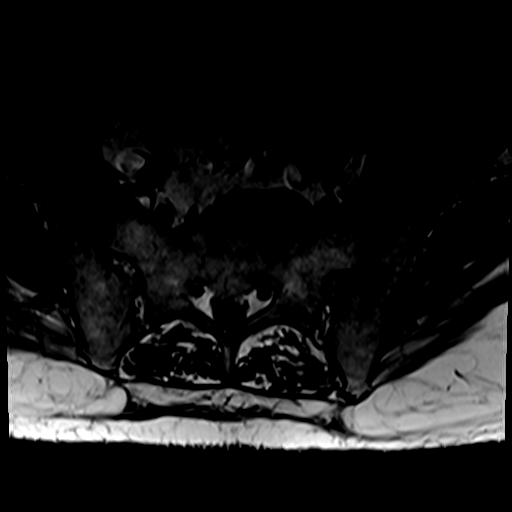
[im 7/44]
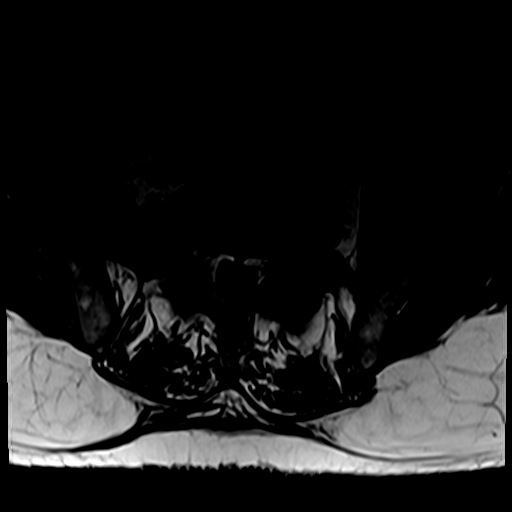
[im 13/44]
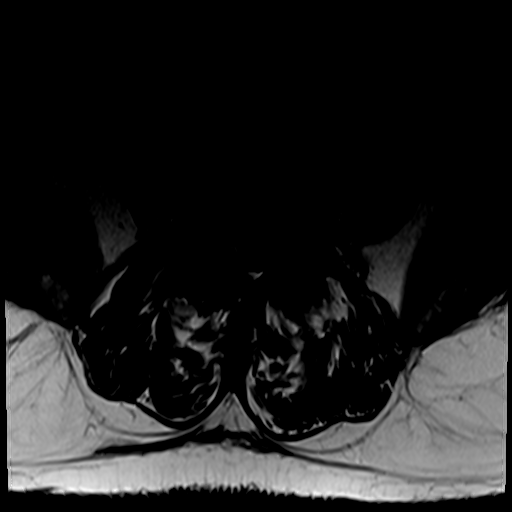
[im 19/44]
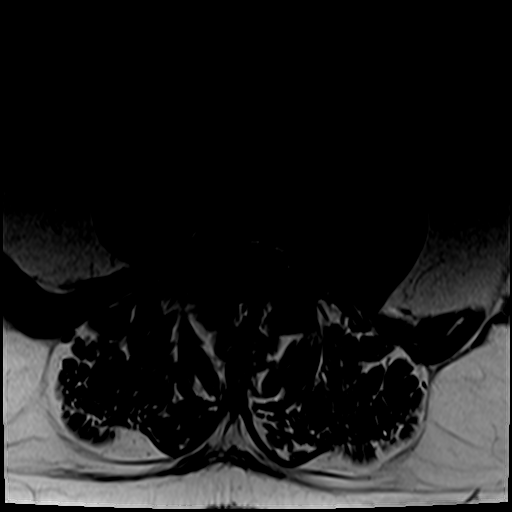
[im 22/44]
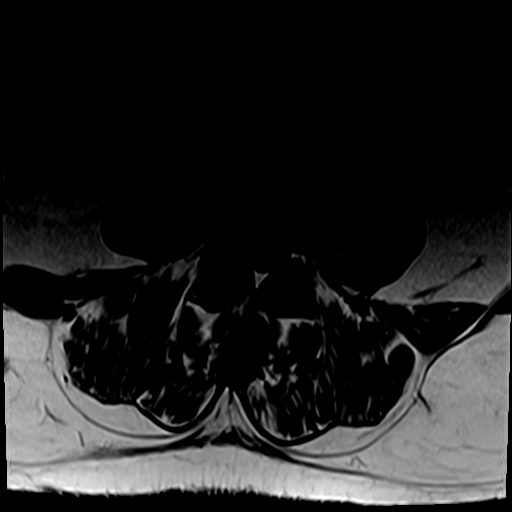
[im 25/44]
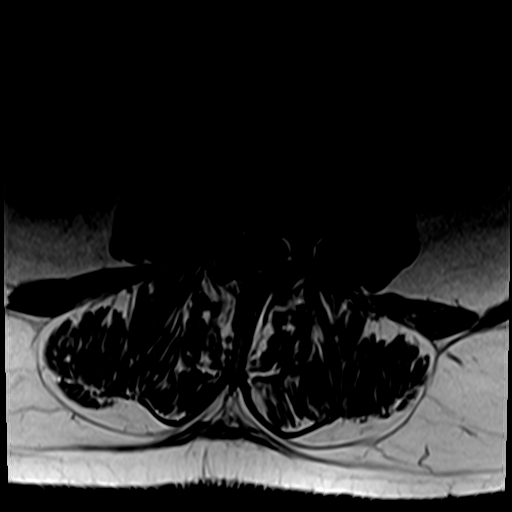
[im 31/44]
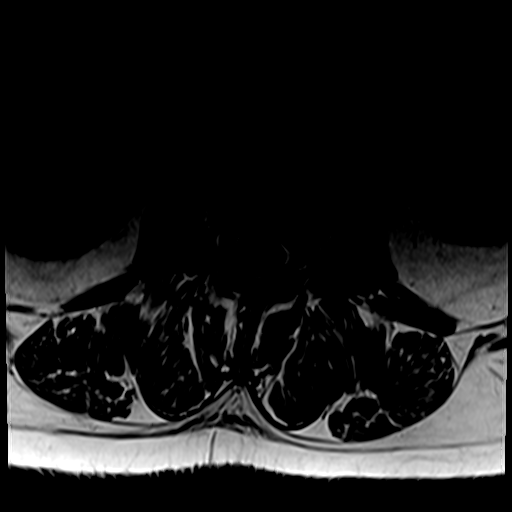
[im 37/44]
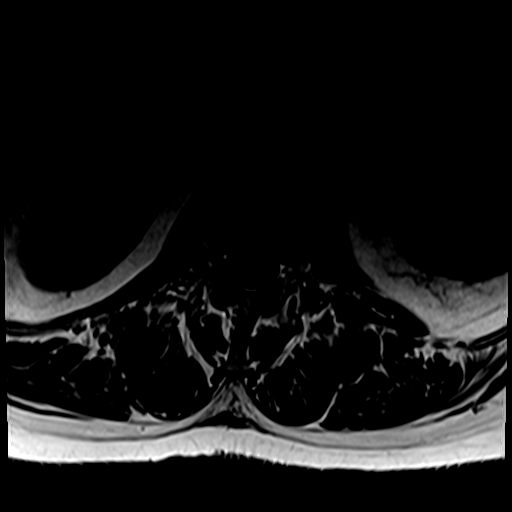
[im 44/44]
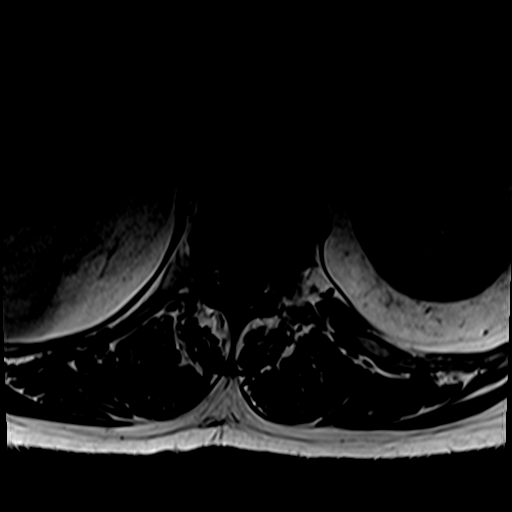

[31 of 48 positions shown; findings below may reference images not displayed]

FINDINGS: Segmentation: Transitional lumbosacral anatomy with lumbarized S1
rudimentary S1-S2 intervertebral disc.

Alignment:  Trace anterolisthesis at L3-L4 and L4-L5.

Vertebrae: No fracture, evidence of discitis, or aggressive bone
lesion.

Conus medullaris and cauda equina: Conus extends to the L1 level.
Conus and cauda equina appear normal.

Paraspinal and other soft tissues: Negative

Disc levels:

T12-L1: No significant spinal canal or neural foraminal narrowing.

L2-L3: Bilateral facet arthropathy which results in mild right
neural foraminal narrowing.

L3-L4: Bilateral facet arthropathy. No significant spinal canal or
neural foraminal narrowing.

L4-L5: Trace anterolisthesis, ligamentum flavum hypertrophy, and
bilateral facet arthropathy results in mild-to-moderate spinal canal
narrowing, mild right neural foraminal narrowing.

L5-S1: Trace anterolisthesis, ligamentum flavum hypertrophy, and
facet arthropathy results in moderate bilateral neural foraminal
narrowing. Mild to moderate spinal canal stenosis. Bony edema within
the left posterior elements an facet periarticular bone.
IMPRESSION: Transitional lumbosacral anatomy with lumbarized S1.

Multilevel degenerative changes of the spine as described above,
with most pertinent levels as summarized below:

L4-L5: Mild-to-moderate spinal canal stenosis and right neural
foraminal narrowing predominantly due to bilateral facet
arthropathy. Trace anterolisthesis at this level.

L5-S1: Mild-to-moderate spinal canal stenosis and moderate bilateral
neural foraminal narrowing predominantly due to bilateral facet
arthropathy.

Bony edema along the posterior elements at L5 and S1 on the left,
which could be related to facet arthritis and/stress reaction.

## 2020-10-18 ENCOUNTER — Other Ambulatory Visit: Payer: Self-pay | Admitting: Neurosurgery

## 2020-10-18 ENCOUNTER — Other Ambulatory Visit (HOSPITAL_COMMUNITY): Payer: Self-pay | Admitting: Neurosurgery

## 2020-10-18 DIAGNOSIS — R200 Anesthesia of skin: Secondary | ICD-10-CM

## 2020-10-22 ENCOUNTER — Ambulatory Visit
Admission: RE | Admit: 2020-10-22 | Discharge: 2020-10-22 | Disposition: A | Discharge status: Home / Self Care | Payer: BC Managed Care – PPO | Source: Ambulatory Visit | Attending: Neurosurgery | Admitting: Neurosurgery

## 2020-10-22 DIAGNOSIS — R200 Anesthesia of skin: Secondary | ICD-10-CM

## 2020-10-22 IMAGING — MR MR HEAD W/O CM
12 series · 46 of 48 positions shown · non-contrast
Comparison: None.

CLINICAL DATA: Numbness.

EXAM:
MRI HEAD WITHOUT CONTRAST
TECHNIQUE: Multiplanar, multiecho pulse sequences of the brain and surrounding
structures were obtained without intravenous contrast.

[Series 5: ax dwi_tracew · axial · 3.0mm · 0.65mm/px · z∈[-113,+41]mm · 4 of 48 slices shown]
[im 1/48]
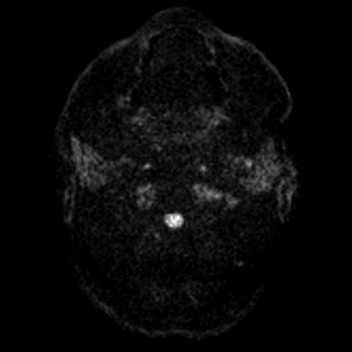
[im 16/48]
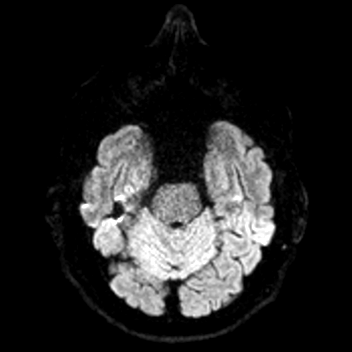
[im 32/48]
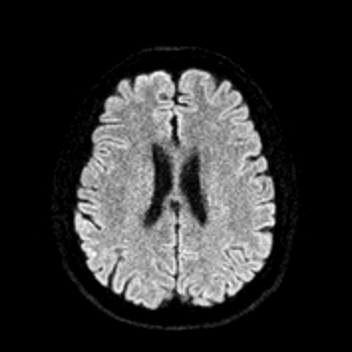
[im 48/48]
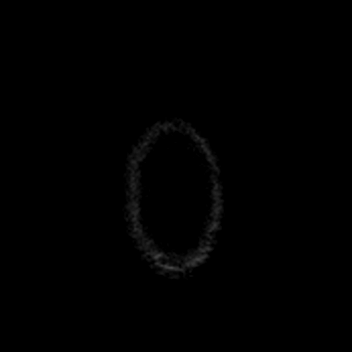

[Series 6: ax dwi_adc · axial · 3.0mm · 0.65mm/px · z∈[-113,+41]mm · 4 of 48 slices shown]
[im 1/48]
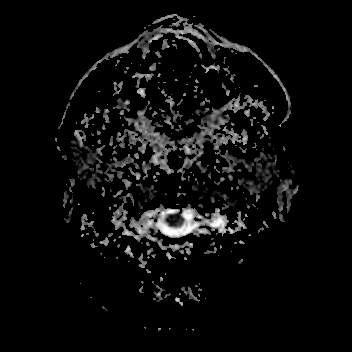
[im 16/48]
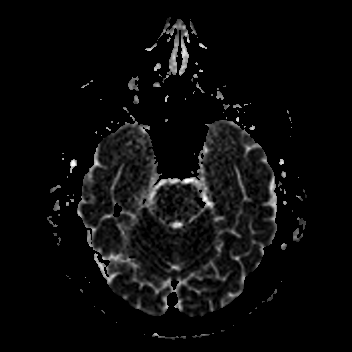
[im 32/48]
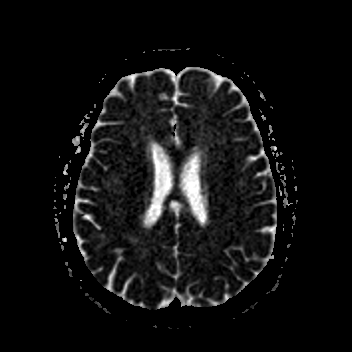
[im 48/48]
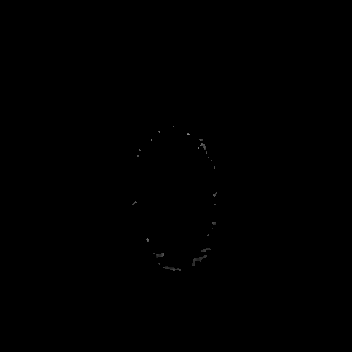

[Series 7: cor dwi_tracew · coronal · 5.0mm · 0.60mm/px · 3 of 38 slices shown]
[im 1/38]
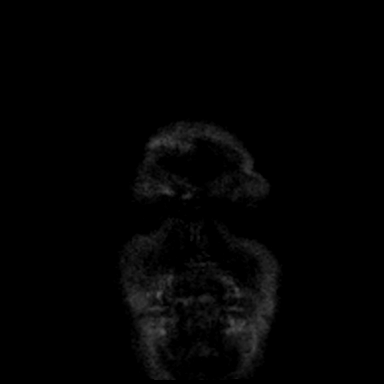
[im 19/38]
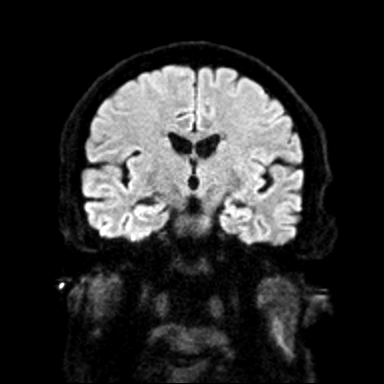
[im 38/38]
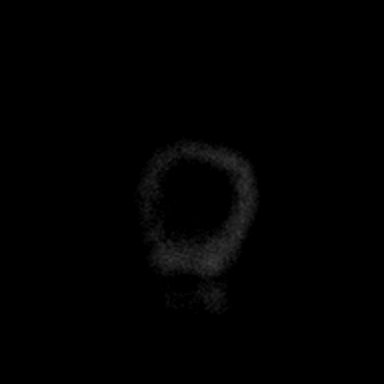

[Series 8: cor dwi_adc · coronal · 5.0mm · 0.60mm/px · 3 of 38 slices shown]
[im 1/38]
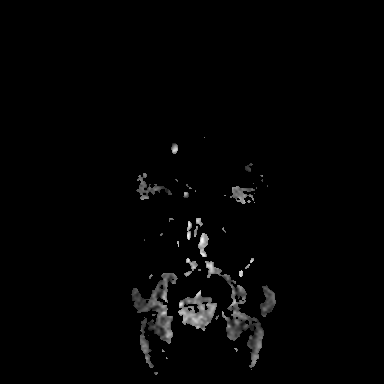
[im 19/38]
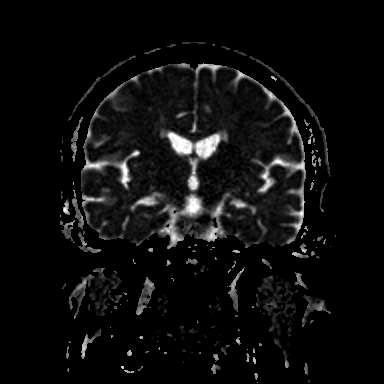
[im 38/38]
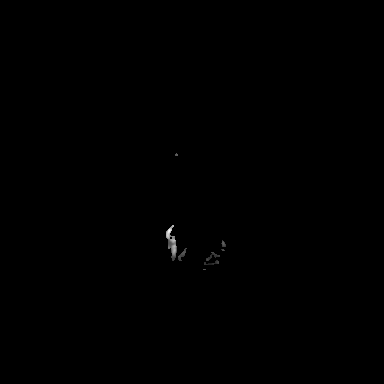

[Series 9: T1 · sagittal · 5.0mm · 0.62mm/px · 2 of 23 slices shown (1 of 2)]
[im 1/23]
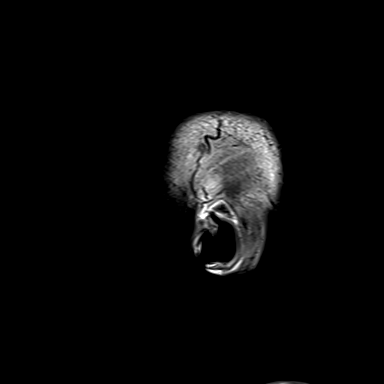
[im 23/23]
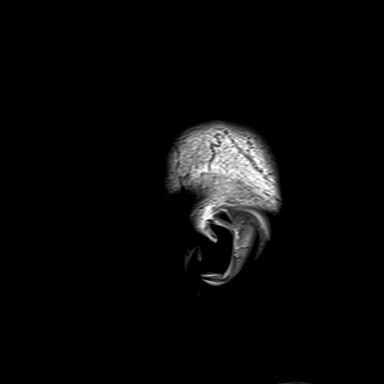

[Series 10: T2 · axial · 5.0mm · 0.53mm/px · z∈[-107,+36]mm · 2 of 25 slices shown (1 of 2)]
[im 1/25]
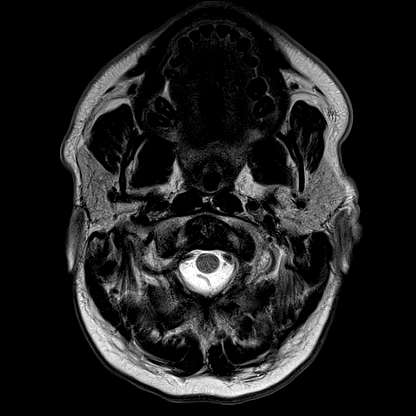
[im 25/25]
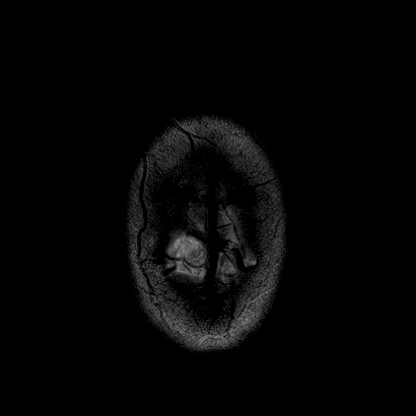

[Series 11: mag_images · axial · 3.0mm · 0.90mm/px · z∈[-124,+52]mm · 4 of 60 slices shown]
[im 1/60]
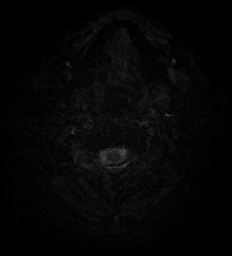
[im 20/60]
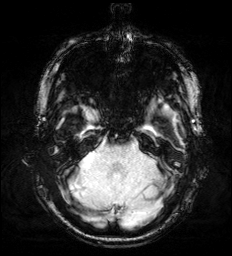
[im 40/60]
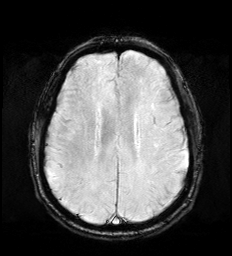
[im 60/60]
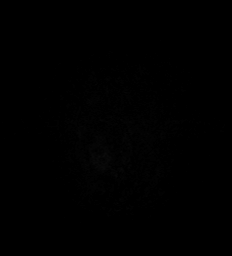

[Series 12: pha_images · axial · 3.0mm · 0.90mm/px · z∈[-124,+52]mm · 4 of 60 slices shown]
[im 1/60]
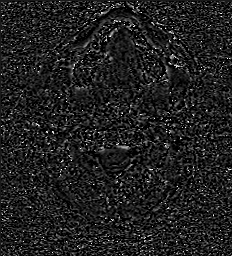
[im 20/60]
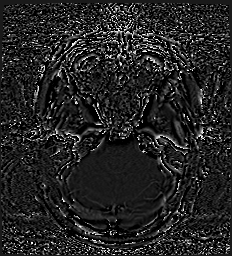
[im 40/60]
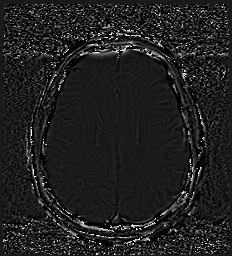
[im 60/60]
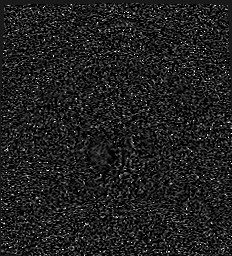

[Series 13: swi_images · axial · 3.0mm · 0.90mm/px · z∈[-124,+52]mm · 4 of 60 slices shown]
[im 1/60]
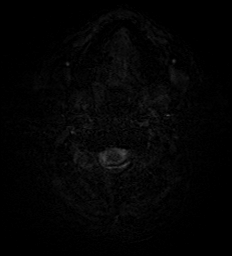
[im 20/60]
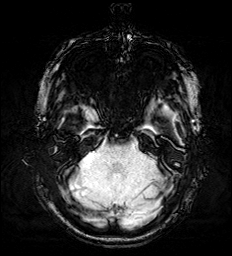
[im 40/60]
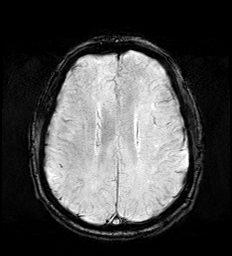
[im 60/60]
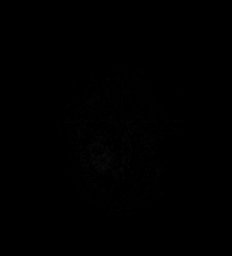

[Series 15: FLAIR · axial · 3.0mm · 0.53mm/px · z∈[-116,+45]mm · 4 of 55 slices shown]
[im 1/55]
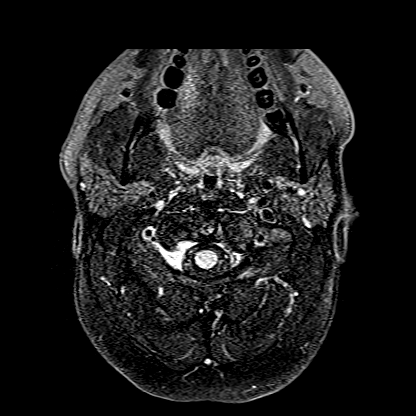
[im 19/55]
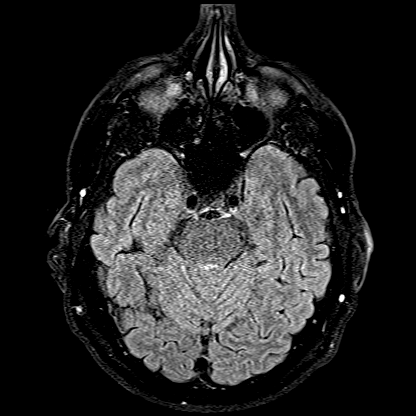
[im 37/55]
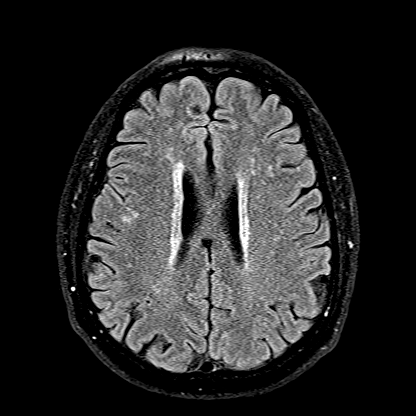
[im 55/55]
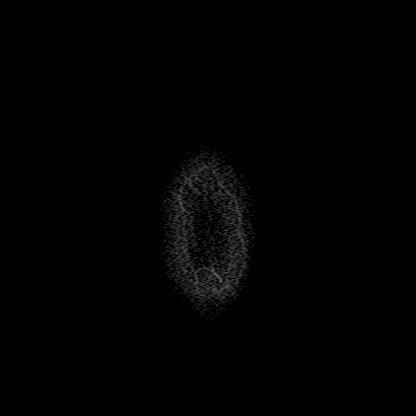

[Series 16: T1 · axial · 1.0mm · 0.98mm/px · z∈[-116,+42]mm · 10 of 160 slices shown (2 of 2)]
[im 1/160]
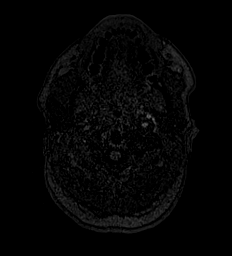
[im 15/160]
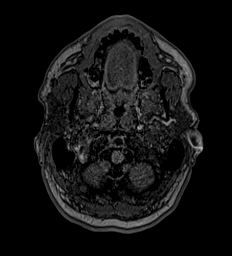
[im 29/160]
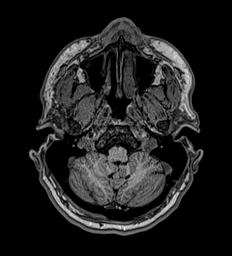
[im 44/160]
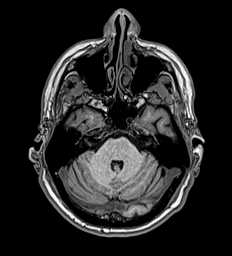
[im 58/160]
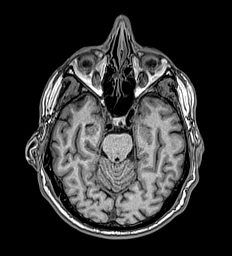
[im 73/160]
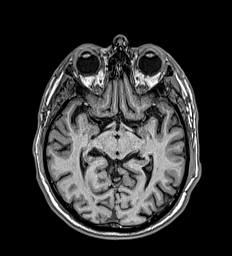
[im 87/160]
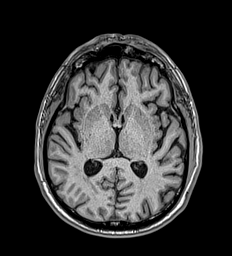
[im 116/160]
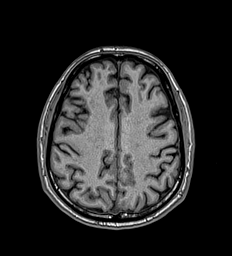
[im 131/160]
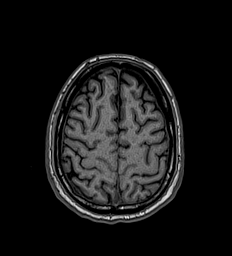
[im 160/160]
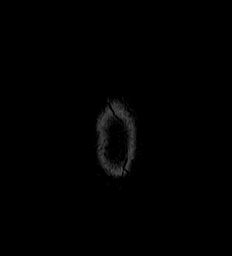

[Series 17: T2 · coronal · 5.0mm · 0.57mm/px · 2 of 29 slices shown (2 of 2)]
[im 1/29]
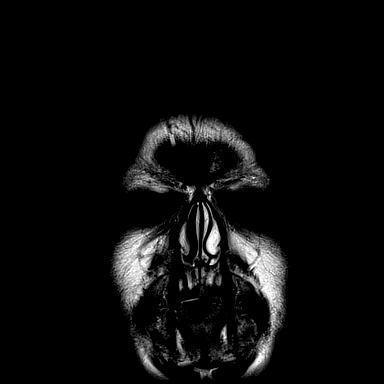
[im 29/29]
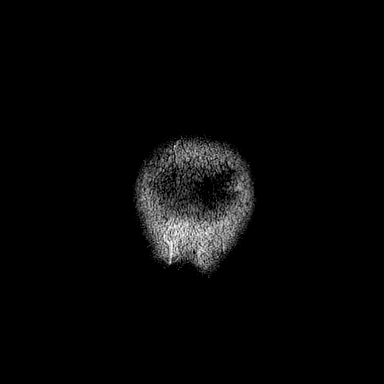

[46 of 48 positions shown; findings below may reference images not displayed]

FINDINGS: Brain: There is no evidence of an acute infarct, intracranial
hemorrhage, mass, midline shift, or extra-axial fluid collection. T2
hyperintensities in the cerebral white matter bilaterally are
nonspecific but compatible with moderately age advanced chronic
small vessel ischemic disease. There is a subcentimeter chronic
infarct inferiorly in the left cerebellar hemisphere. The ventricles
and sulci are within normal limits for age.

Vascular: Major intracranial vascular flow voids are preserved.

Skull and upper cervical spine: Unremarkable bone marrow signal.
Asymmetrically advanced facet arthrosis on the right at C2-3 and on
the left at C3-4.

Sinuses/Orbits: Unremarkable orbits. Minimal mucosal thickening in
the paranasal sinuses. Trace left mastoid fluid.

Other: None.
IMPRESSION: 1. No acute intracranial abnormality.
2. Moderate chronic small vessel ischemic disease.
3. Small chronic left cerebellar infarct.

## 2020-11-27 ENCOUNTER — Other Ambulatory Visit: Payer: Self-pay | Admitting: Neurosurgery

## 2020-12-12 ENCOUNTER — Other Ambulatory Visit: Payer: Self-pay

## 2020-12-12 ENCOUNTER — Encounter
Admission: RE | Admit: 2020-12-12 | Discharge: 2020-12-12 | Disposition: A | Discharge status: Home / Self Care | Payer: 59 | Source: Ambulatory Visit | Attending: Neurosurgery | Admitting: Neurosurgery

## 2020-12-12 DIAGNOSIS — Z01818 Encounter for other preprocedural examination: Secondary | ICD-10-CM | POA: Diagnosis present

## 2020-12-12 LAB — URINALYSIS, ROUTINE W REFLEX MICROSCOPIC
Bilirubin Urine: NEGATIVE
Glucose, UA: NEGATIVE mg/dL
Hgb urine dipstick: NEGATIVE
Ketones, ur: NEGATIVE mg/dL
Leukocytes,Ua: NEGATIVE
Nitrite: NEGATIVE
Protein, ur: NEGATIVE mg/dL
Specific Gravity, Urine: 1.008 (ref 1.005–1.030)
pH: 7 (ref 5.0–8.0)

## 2020-12-12 LAB — BASIC METABOLIC PANEL
Anion gap: 8 (ref 5–15)
BUN: 14 mg/dL (ref 6–20)
CO2: 26 mmol/L (ref 22–32)
Calcium: 9.3 mg/dL (ref 8.9–10.3)
Chloride: 104 mmol/L (ref 98–111)
Creatinine, Ser: 0.86 mg/dL (ref 0.61–1.24)
GFR, Estimated: >60 mL/min (ref >60)
Glucose, Bld: 91 mg/dL (ref 70–99)
Potassium: 3.2 mmol/L — ABNORMAL LOW (ref 3.5–5.1)
Sodium: 138 mmol/L (ref 135–145)

## 2020-12-12 LAB — TYPE AND SCREEN
ABO/RH(D): O NEG
Antibody Screen: NEGATIVE

## 2020-12-12 LAB — CBC
HCT: 39.9 % (ref 39.0–52.0)
Hemoglobin: 14.2 g/dL (ref 13.0–17.0)
MCH: 31.3 pg (ref 26.0–34.0)
MCHC: 35.6 g/dL (ref 30.0–36.0)
MCV: 88.1 fL (ref 80.0–100.0)
Platelets: 230 10*3/uL (ref 150–400)
RBC: 4.53 MIL/uL (ref 4.22–5.81)
RDW: 12.7 % (ref 11.5–15.5)
WBC: 5.4 10*3/uL (ref 4.0–10.5)
nRBC: 0 % (ref 0.0–0.2)

## 2020-12-12 LAB — PROTIME-INR
INR: 1 (ref 0.8–1.2)
Prothrombin Time: 12.8 seconds (ref 11.4–15.2)

## 2020-12-12 LAB — SURGICAL PCR SCREEN
MRSA, PCR: NEGATIVE
Staphylococcus aureus: NEGATIVE

## 2020-12-12 LAB — APTT: aPTT: 30 seconds (ref 24–36)

## 2020-12-12 NOTE — Patient Instructions (Signed)
Your procedure is scheduled on: 12/23/20 Report to [facility]. To find out your arrival time please call [phone] between 1PM - 3PM on 12/20/20.  Remember: Instructions that are not followed completely may result in serious medical risk, up to and including death, or upon the discretion of your surgeon and anesthesiologist your surgery may need to be rescheduled.     _X__ 1. Do not eat food after midnight the night before your procedure.                 No gum chewing or hard candies. You may drink clear liquids up to 2 hours                 before you are scheduled to arrive for your surgery- DO not drink clear                 liquids within 2 hours of the start of your surgery.                 Clear Liquids include:  water, apple juice without pulp, clear carbohydrate                 drink such as Clearfast or Gatorade, Black Coffee or Tea (Do not add                 anything to coffee or tea). Diabetics water only  __X__2.  On the morning of surgery brush your teeth with toothpaste and water, you                 may rinse your mouth with mouthwash if you wish.  Do not swallow any              toothpaste of mouthwash.     _X__ 3.  No Alcohol for 24 hours before or after surgery.   _X__ 4.  Do Not Smoke or use e-cigarettes For 24 Hours Prior to Your Surgery.                 Do not use any chewable tobacco products for at least 6 hours prior to                 surgery.  ____  5.  Bring all medications with you on the day of surgery if instructed.   __X__  6.  Notify your doctor if there is any change in your medical condition      (cold, fever, infections).     Do not wear jewelry, make-up, hairpins, clips or nail polish. Do not wear lotions, powders, or perfumes. No deodorant Do not shave body hair 48 hours prior to surgery. Men may shave face and neck. Do not bring valuables to the hospital.    [facility] is not  responsible for any belongings or valuables.  Contacts, dentures/partials or body piercings may not be worn into surgery. Bring a case for your contacts, glasses or hearing aids, a denture cup will be supplied. Leave your suitcase in the car. After surgery it may be brought to your room. For patients admitted to the hospital, discharge time is determined by your treatment team.   Patients discharged the day of surgery will not be allowed to drive home.   Please read over the following fact sheets that you were given:   MRSA Information, CHG soap  __X__ Take these medicines the morning of surgery with A  SIP OF WATER:    1. levothyroxine (SYNTHROID) 274 MCG tablet  2. gabapentin (NEURONTIN) 300 MG capsule  3.   4.  5.  6.  ____ Fleet Enema (as directed)   __X__ Use CHG Soap/SAGE wipes as directed  ____ Use inhalers on the day of surgery  ____ Stop metformin/Janumet/Farxiga 2 days prior to surgery    ____ Take 1/2 of usual insulin dose the night before surgery. No insulin the morning          of surgery.   ____ Stop Blood Thinners Coumadin/Plavix/Xarelto/Pleta/Pradaxa/Eliquis/Effient/Aspirin  on   Or contact your Surgeon, Cardiologist or Medical Doctor regarding  ability to stop your blood thinners  __X__ Stop Anti-inflammatories 7 days before surgery such as Advil, Ibuprofen, Motrin,  BC or Goodies Powder, Naprosyn, Naproxen, Aleve, Aspirin   OK to take Tylenol if needed  __X__ Stop all herbal supplements, fish oil or vitamin E until after surgery.    ____ Bring C-Pap to the hospital.

## 2020-12-19 ENCOUNTER — Other Ambulatory Visit
Admission: RE | Admit: 2020-12-19 | Discharge: 2020-12-19 | Disposition: A | Discharge status: Home / Self Care | Payer: 59 | Source: Ambulatory Visit | Attending: Neurosurgery | Admitting: Neurosurgery

## 2020-12-19 ENCOUNTER — Encounter: Payer: Self-pay | Admitting: Urgent Care

## 2020-12-19 ENCOUNTER — Other Ambulatory Visit: Payer: Self-pay

## 2020-12-19 DIAGNOSIS — Z01812 Encounter for preprocedural laboratory examination: Secondary | ICD-10-CM | POA: Insufficient documentation

## 2020-12-19 DIAGNOSIS — Z20822 Contact with and (suspected) exposure to covid-19: Secondary | ICD-10-CM | POA: Diagnosis not present

## 2020-12-19 DIAGNOSIS — R058 Other specified cough: Secondary | ICD-10-CM

## 2020-12-19 LAB — SARS CORONAVIRUS 2 (TAT 6-24 HRS): SARS Coronavirus 2: NEGATIVE

## 2020-12-23 ENCOUNTER — Ambulatory Visit: Payer: 59 | Admitting: Urgent Care

## 2020-12-23 ENCOUNTER — Observation Stay
Admission: RE | Admit: 2020-12-23 | Discharge: 2020-12-24 | Disposition: A | Discharge status: Home / Self Care | Payer: 59 | Attending: Neurosurgery | Admitting: Neurosurgery

## 2020-12-23 ENCOUNTER — Other Ambulatory Visit: Payer: Self-pay

## 2020-12-23 ENCOUNTER — Encounter
Admission: RE | Disposition: A | Discharge status: Home / Self Care | Payer: Self-pay | Source: Home / Self Care | Attending: Neurosurgery

## 2020-12-23 ENCOUNTER — Encounter: Payer: Self-pay | Admitting: Neurosurgery

## 2020-12-23 ENCOUNTER — Ambulatory Visit: Payer: 59

## 2020-12-23 DIAGNOSIS — Z87891 Personal history of nicotine dependence: Secondary | ICD-10-CM | POA: Insufficient documentation

## 2020-12-23 DIAGNOSIS — M4802 Spinal stenosis, cervical region: Secondary | ICD-10-CM | POA: Diagnosis not present

## 2020-12-23 DIAGNOSIS — E039 Hypothyroidism, unspecified: Secondary | ICD-10-CM | POA: Diagnosis not present

## 2020-12-23 DIAGNOSIS — Z20822 Contact with and (suspected) exposure to covid-19: Secondary | ICD-10-CM

## 2020-12-23 DIAGNOSIS — Z96653 Presence of artificial knee joint, bilateral: Secondary | ICD-10-CM | POA: Insufficient documentation

## 2020-12-23 DIAGNOSIS — Z4889 Encounter for other specified surgical aftercare: Secondary | ICD-10-CM | POA: Diagnosis not present

## 2020-12-23 DIAGNOSIS — Z419 Encounter for procedure for purposes other than remedying health state, unspecified: Secondary | ICD-10-CM

## 2020-12-23 DIAGNOSIS — I10 Essential (primary) hypertension: Secondary | ICD-10-CM | POA: Insufficient documentation

## 2020-12-23 HISTORY — PX: POSTERIOR CERVICAL LAMINECTOMY: SHX2248

## 2020-12-23 IMAGING — RF DG C-ARM 1-60 MIN
1 series · 1 of 1 positions shown · non-contrast
Comparison: None.

FLUOROSCOPY TIME:  5 seconds

CLINICAL DATA: Attempted laminoplasty, C3-C6 laminectomy

EXAM:
DG CERVICAL SPINE - 1 VIEW; DG C-ARM 1-60 MIN

[Series 1: dg x-ray · 0.20mm/px · 1 of 1 slices shown]
[im 1/1]
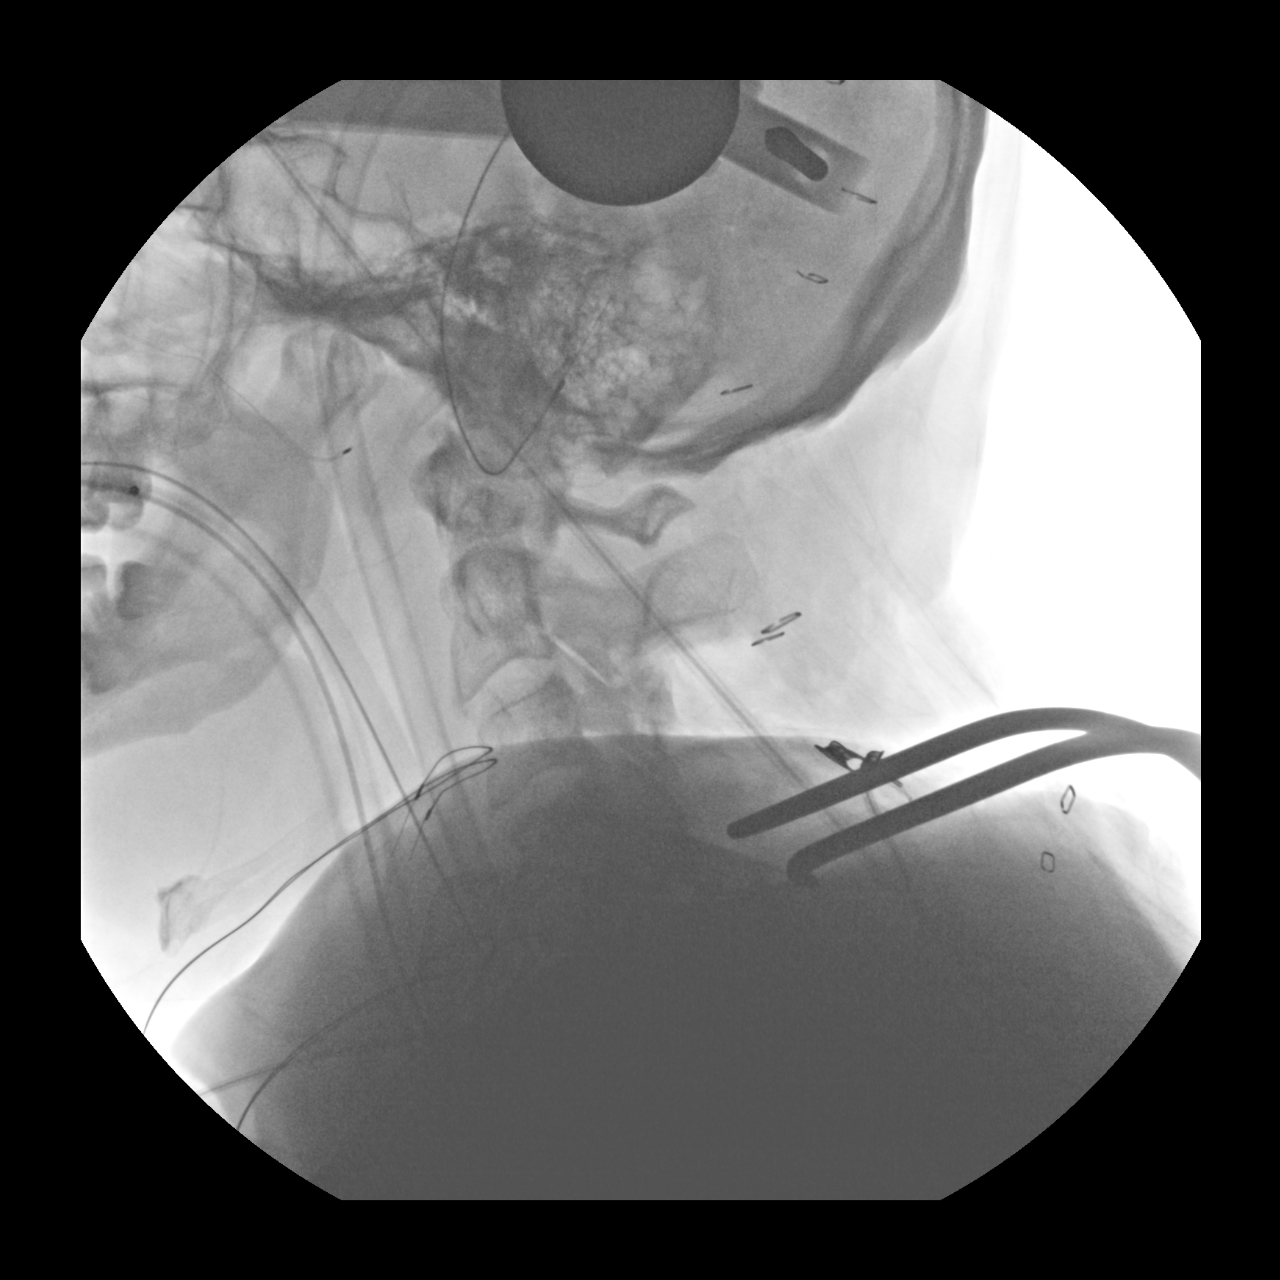

[1 of 1 positions shown; findings below may reference images not displayed]

FINDINGS: Lateral intraoperative cervical radiograph demonstrates retracting
instrument overlying the posterior elements at the level of C4-C5.
IMPRESSION: Intraoperative cervical spine radiograph with retracting instrument
at the level of C4-C5.

## 2020-12-23 IMAGING — RF DG C-ARM 1-60 MIN
1 series · 1 of 1 positions shown · non-contrast
Comparison: None.

FLUOROSCOPY TIME:  5 seconds

CLINICAL DATA: Attempted laminoplasty, C3-C6 laminectomy

EXAM:
DG CERVICAL SPINE - 1 VIEW; DG C-ARM 1-60 MIN

[Series 1: dg x-ray · 0.20mm/px · 1 of 1 slices shown]
[im 1/1]
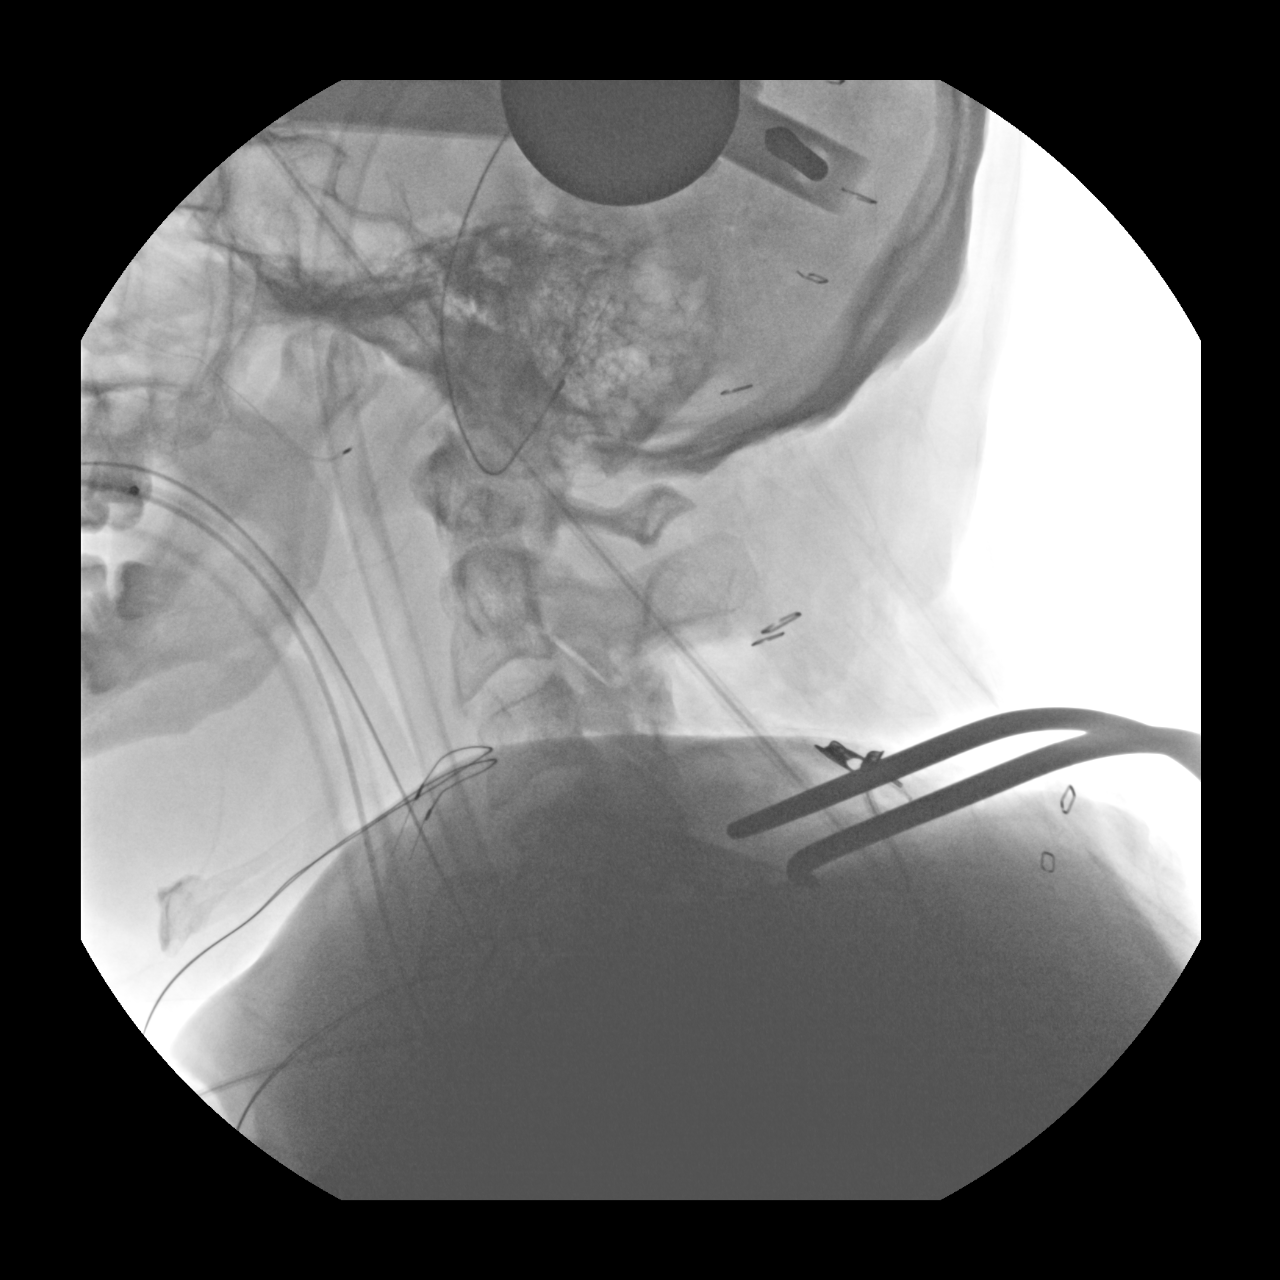

[1 of 1 positions shown; findings below may reference images not displayed]

FINDINGS: Lateral intraoperative cervical radiograph demonstrates retracting
instrument overlying the posterior elements at the level of C4-C5.
IMPRESSION: Intraoperative cervical spine radiograph with retracting instrument
at the level of C4-C5.

## 2020-12-23 IMAGING — RF DG CERVICAL SPINE 1V
1 series · 1 of 1 positions shown · non-contrast
Comparison: None.

FLUOROSCOPY TIME:  5 seconds

CLINICAL DATA: Attempted laminoplasty, C3-C6 laminectomy

EXAM:
DG CERVICAL SPINE - 1 VIEW; DG C-ARM 1-60 MIN

[Series 1: dg x-ray · 0.20mm/px · 1 of 1 slices shown]
[im 1/1]
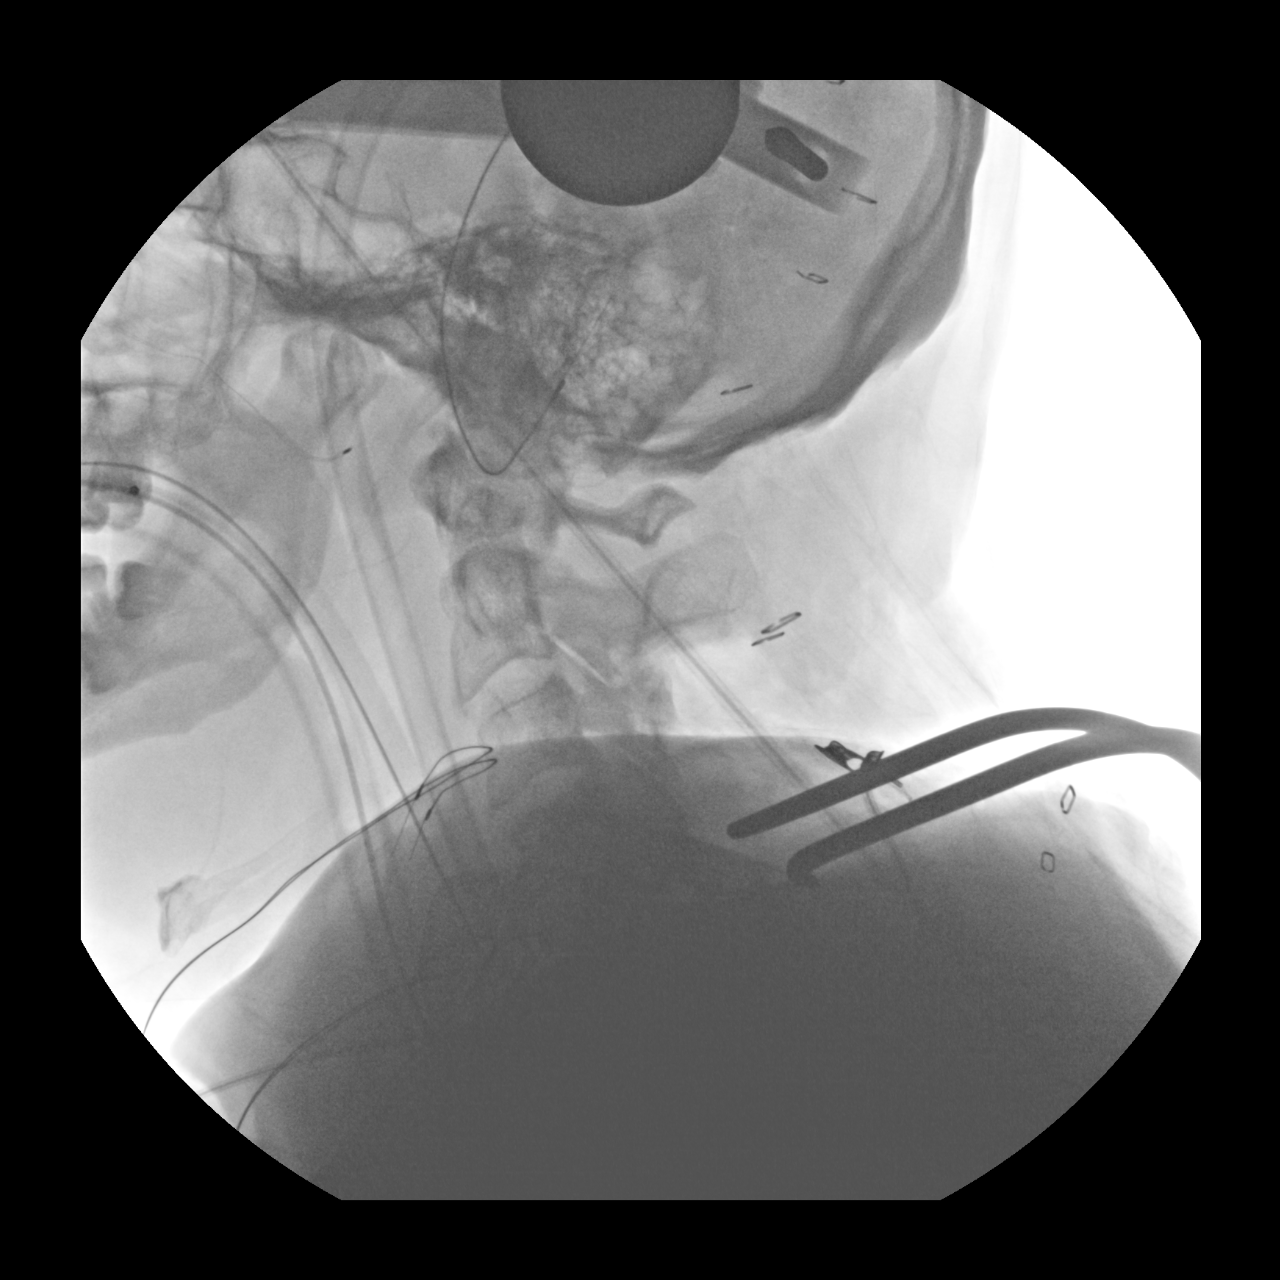

[1 of 1 positions shown; findings below may reference images not displayed]

FINDINGS: Lateral intraoperative cervical radiograph demonstrates retracting
instrument overlying the posterior elements at the level of C4-C5.
IMPRESSION: Intraoperative cervical spine radiograph with retracting instrument
at the level of C4-C5.

## 2020-12-23 SURGERY — POSTERIOR CERVICAL LAMINECTOMY
Anesthesia: General

## 2020-12-23 MED ORDER — DOXAZOSIN MESYLATE 4 MG PO TABS
8.0000 mg | ORAL_TABLET | Freq: Every day | ORAL | Status: DC
  Administered 2020-12-23: 8 mg via ORAL
  Filled 2020-12-23: qty 2
  Filled 2020-12-23: qty 1
  Filled 2020-12-23: qty 2

## 2020-12-23 MED ORDER — GABAPENTIN 300 MG PO CAPS
300.0000 mg | ORAL_CAPSULE | Freq: Three times a day (TID) | ORAL | Status: DC
  Administered 2020-12-23 – 2020-12-24 (×4): 300 mg via ORAL
  Filled 2020-12-23 (×4): qty 1

## 2020-12-23 MED ORDER — SODIUM CHLORIDE 0.9 % IV SOLN
INTRAVENOUS | Status: DC | PRN
  Administered 2020-12-23: .3 ug/kg/min via INTRAVENOUS

## 2020-12-23 MED ORDER — KETAMINE HCL 10 MG/ML IJ SOLN
INTRAMUSCULAR | Status: DC | PRN
  Administered 2020-12-23: 20 mg via INTRAVENOUS
  Administered 2020-12-23: 50 mg via INTRAVENOUS
  Administered 2020-12-23: 30 mg via INTRAVENOUS

## 2020-12-23 MED ORDER — OXYCODONE HCL 5 MG PO TABS
10.0000 mg | ORAL_TABLET | ORAL | Status: DC | PRN
  Administered 2020-12-23 – 2020-12-24 (×3): 10 mg via ORAL
  Filled 2020-12-23 (×3): qty 2

## 2020-12-23 MED ORDER — LIDOCAINE HCL (CARDIAC) PF 100 MG/5ML IV SOSY
PREFILLED_SYRINGE | INTRAVENOUS | Status: DC | PRN
  Administered 2020-12-23: 100 mg via INTRAVENOUS

## 2020-12-23 MED ORDER — LACTATED RINGERS IV SOLN: INTRAVENOUS | Status: DC

## 2020-12-23 MED ORDER — FENTANYL CITRATE (PF) 100 MCG/2ML IJ SOLN
INTRAMUSCULAR | Status: AC
  Filled 2020-12-23: qty 2

## 2020-12-23 MED ORDER — TADALAFIL 5 MG PO TABS: 15.0000 mg | ORAL_TABLET | Freq: Every day | ORAL | Status: DC | PRN

## 2020-12-23 MED ORDER — BACITRACIN 500 UNIT/GM EX OINT
TOPICAL_OINTMENT | CUTANEOUS | Status: DC | PRN
  Administered 2020-12-23: 1 via TOPICAL

## 2020-12-23 MED ORDER — SUGAMMADEX SODIUM 200 MG/2ML IV SOLN
INTRAVENOUS | Status: DC | PRN
  Administered 2020-12-23: 200 mg via INTRAVENOUS

## 2020-12-23 MED ORDER — PROPOFOL 10 MG/ML IV BOLUS
INTRAVENOUS | Status: DC | PRN
  Administered 2020-12-23: 200 mg via INTRAVENOUS

## 2020-12-23 MED ORDER — PROPOFOL 10 MG/ML IV BOLUS
INTRAVENOUS | Status: AC
  Filled 2020-12-23: qty 20

## 2020-12-23 MED ORDER — METHOCARBAMOL 1000 MG/10ML IJ SOLN
500.0000 mg | Freq: Four times a day (QID) | INTRAVENOUS | Status: DC
  Filled 2020-12-23: qty 5

## 2020-12-23 MED ORDER — ONDANSETRON HCL 4 MG/2ML IJ SOLN: 4.0000 mg | Freq: Four times a day (QID) | INTRAMUSCULAR | Status: DC | PRN

## 2020-12-23 MED ORDER — QUINAPRIL HCL 10 MG PO TABS
40.0000 mg | ORAL_TABLET | Freq: Two times a day (BID) | ORAL | Status: DC
  Administered 2020-12-23 – 2020-12-24 (×2): 40 mg via ORAL
  Filled 2020-12-23 (×3): qty 4

## 2020-12-23 MED ORDER — POTASSIUM CHLORIDE CRYS ER 10 MEQ PO TBCR: 10.0000 meq | EXTENDED_RELEASE_TABLET | ORAL | Status: DC

## 2020-12-23 MED ORDER — SUCCINYLCHOLINE CHLORIDE 200 MG/10ML IV SOSY
PREFILLED_SYRINGE | INTRAVENOUS | Status: DC | PRN
  Administered 2020-12-23: 100 mg via INTRAVENOUS

## 2020-12-23 MED ORDER — MIDAZOLAM HCL 2 MG/2ML IJ SOLN
INTRAMUSCULAR | Status: AC
  Filled 2020-12-23: qty 2

## 2020-12-23 MED ORDER — FAMOTIDINE 20 MG PO TABS
ORAL_TABLET | ORAL | Status: AC
  Administered 2020-12-23: 20 mg via ORAL
  Filled 2020-12-23: qty 1

## 2020-12-23 MED ORDER — PROPOFOL 500 MG/50ML IV EMUL
INTRAVENOUS | Status: DC | PRN
  Administered 2020-12-23: 150 ug/kg/min via INTRAVENOUS

## 2020-12-23 MED ORDER — METHOCARBAMOL 500 MG PO TABS
ORAL_TABLET | ORAL | Status: AC
  Administered 2020-12-23: 500 mg
  Filled 2020-12-23: qty 1

## 2020-12-23 MED ORDER — CHLORHEXIDINE GLUCONATE 0.12 % MT SOLN: 15.0000 mL | Freq: Once | OROMUCOSAL | Status: AC

## 2020-12-23 MED ORDER — CEFAZOLIN SODIUM-DEXTROSE 2-4 GM/100ML-% IV SOLN
2.0000 g | INTRAVENOUS | Status: AC
  Administered 2020-12-23: 2 g via INTRAVENOUS

## 2020-12-23 MED ORDER — MORPHINE SULFATE (PF) 2 MG/ML IV SOLN: 2.0000 mg | INTRAVENOUS | Status: DC | PRN

## 2020-12-23 MED ORDER — DILTIAZEM HCL ER COATED BEADS 300 MG PO CP24
300.0000 mg | ORAL_CAPSULE | Freq: Every day | ORAL | Status: DC
  Administered 2020-12-23 – 2020-12-24 (×2): 300 mg via ORAL
  Filled 2020-12-23 (×2): qty 1

## 2020-12-23 MED ORDER — BACITRACIN ZINC 500 UNIT/GM EX OINT
TOPICAL_OINTMENT | CUTANEOUS | Status: AC
  Filled 2020-12-23: qty 28.35

## 2020-12-23 MED ORDER — SODIUM CHLORIDE 0.9 % IV SOLN: 250.0000 mL | INTRAVENOUS | Status: DC

## 2020-12-23 MED ORDER — FENTANYL CITRATE (PF) 100 MCG/2ML IJ SOLN
25.0000 ug | INTRAMUSCULAR | Status: DC | PRN
  Administered 2020-12-23: 50 ug via INTRAVENOUS

## 2020-12-23 MED ORDER — PHENYLEPHRINE HCL (PRESSORS) 10 MG/ML IV SOLN
INTRAVENOUS | Status: DC | PRN
  Administered 2020-12-23: 100 ug via INTRAVENOUS

## 2020-12-23 MED ORDER — KETAMINE HCL 50 MG/5ML IJ SOSY
PREFILLED_SYRINGE | INTRAMUSCULAR | Status: AC
  Filled 2020-12-23: qty 5

## 2020-12-23 MED ORDER — CELECOXIB 200 MG PO CAPS
200.0000 mg | ORAL_CAPSULE | Freq: Two times a day (BID) | ORAL | Status: DC
  Administered 2020-12-23 – 2020-12-24 (×3): 200 mg via ORAL
  Filled 2020-12-23 (×3): qty 1

## 2020-12-23 MED ORDER — PROPOFOL 1000 MG/100ML IV EMUL
INTRAVENOUS | Status: AC
  Filled 2020-12-23: qty 100

## 2020-12-23 MED ORDER — MENTHOL 3 MG MT LOZG
1.0000 | LOZENGE | OROMUCOSAL | Status: DC | PRN
  Filled 2020-12-23: qty 9

## 2020-12-23 MED ORDER — PHENYLEPHRINE HCL-NACL 20-0.9 MG/250ML-% IV SOLN
INTRAVENOUS | Status: DC | PRN
  Administered 2020-12-23: 60 ug/min via INTRAVENOUS

## 2020-12-23 MED ORDER — METHOCARBAMOL 500 MG PO TABS
500.0000 mg | ORAL_TABLET | Freq: Four times a day (QID) | ORAL | Status: DC
  Administered 2020-12-23 – 2020-12-24 (×5): 500 mg via ORAL
  Filled 2020-12-23 (×5): qty 1

## 2020-12-23 MED ORDER — ONDANSETRON HCL 4 MG PO TABS: 4.0000 mg | ORAL_TABLET | Freq: Four times a day (QID) | ORAL | Status: DC | PRN

## 2020-12-23 MED ORDER — REMIFENTANIL HCL 1 MG IV SOLR
INTRAVENOUS | Status: AC
  Filled 2020-12-23: qty 2000

## 2020-12-23 MED ORDER — FLEET ENEMA 7-19 GM/118ML RE ENEM: 1.0000 | ENEMA | Freq: Once | RECTAL | Status: DC | PRN

## 2020-12-23 MED ORDER — ROCURONIUM BROMIDE 100 MG/10ML IV SOLN
INTRAVENOUS | Status: DC | PRN
  Administered 2020-12-23: 50 mg via INTRAVENOUS

## 2020-12-23 MED ORDER — LIDOCAINE-EPINEPHRINE 1 %-1:100000 IJ SOLN
INTRAMUSCULAR | Status: AC
  Filled 2020-12-23: qty 1

## 2020-12-23 MED ORDER — ACETAMINOPHEN 650 MG RE SUPP: 650.0000 mg | RECTAL | Status: DC | PRN

## 2020-12-23 MED ORDER — LEVOTHYROXINE SODIUM 137 MCG PO TABS
274.0000 ug | ORAL_TABLET | Freq: Every day | ORAL | Status: DC
  Administered 2020-12-24: 274 ug via ORAL
  Filled 2020-12-23 (×2): qty 2

## 2020-12-23 MED ORDER — PHENOL 1.4 % MT LIQD
1.0000 | OROMUCOSAL | Status: DC | PRN
  Filled 2020-12-23: qty 177

## 2020-12-23 MED ORDER — FENTANYL CITRATE (PF) 100 MCG/2ML IJ SOLN
INTRAMUSCULAR | Status: AC
  Administered 2020-12-23: 50 ug via INTRAVENOUS
  Filled 2020-12-23: qty 2

## 2020-12-23 MED ORDER — LIDOCAINE-EPINEPHRINE 1 %-1:100000 IJ SOLN
INTRAMUSCULAR | Status: DC | PRN
  Administered 2020-12-23: 10 mL

## 2020-12-23 MED ORDER — FAMOTIDINE 20 MG PO TABS: 20.0000 mg | ORAL_TABLET | Freq: Once | ORAL | Status: AC

## 2020-12-23 MED ORDER — POTASSIUM CHLORIDE CRYS ER 20 MEQ PO TBCR
30.0000 meq | EXTENDED_RELEASE_TABLET | Freq: Every day | ORAL | Status: DC
  Administered 2020-12-23 – 2020-12-24 (×2): 30 meq via ORAL
  Filled 2020-12-23 (×2): qty 1

## 2020-12-23 MED ORDER — EPHEDRINE SULFATE 50 MG/ML IJ SOLN
INTRAMUSCULAR | Status: DC | PRN
  Administered 2020-12-23: 5 mg via INTRAVENOUS
  Administered 2020-12-23 (×2): 10 mg via INTRAVENOUS

## 2020-12-23 MED ORDER — MIDAZOLAM HCL 2 MG/2ML IJ SOLN
INTRAMUSCULAR | Status: DC | PRN
  Administered 2020-12-23 (×2): 2 mg via INTRAVENOUS

## 2020-12-23 MED ORDER — SODIUM CHLORIDE 0.9% FLUSH
3.0000 mL | Freq: Two times a day (BID) | INTRAVENOUS | Status: DC
  Administered 2020-12-23: 3 mL via INTRAVENOUS

## 2020-12-23 MED ORDER — 0.9 % SODIUM CHLORIDE (POUR BTL) OPTIME
TOPICAL | Status: DC | PRN
  Administered 2020-12-23: 1000 mL

## 2020-12-23 MED ORDER — ONDANSETRON HCL 4 MG/2ML IJ SOLN
INTRAMUSCULAR | Status: DC | PRN
  Administered 2020-12-23: 4 mg via INTRAVENOUS

## 2020-12-23 MED ORDER — SODIUM CHLORIDE 0.9 % IV SOLN: INTRAVENOUS | Status: DC

## 2020-12-23 MED ORDER — CHLORHEXIDINE GLUCONATE 0.12 % MT SOLN
OROMUCOSAL | Status: AC
  Administered 2020-12-23: 15 mL via OROMUCOSAL
  Filled 2020-12-23: qty 15

## 2020-12-23 MED ORDER — CEFAZOLIN SODIUM-DEXTROSE 2-4 GM/100ML-% IV SOLN
INTRAVENOUS | Status: AC
  Filled 2020-12-23: qty 100

## 2020-12-23 MED ORDER — DEXMEDETOMIDINE HCL IN NACL 200 MCG/50ML IV SOLN
INTRAVENOUS | Status: DC | PRN
  Administered 2020-12-23: 12 ug via INTRAVENOUS

## 2020-12-23 MED ORDER — ACETAMINOPHEN 325 MG PO TABS: 650.0000 mg | ORAL_TABLET | ORAL | Status: DC | PRN

## 2020-12-23 MED ORDER — POLYETHYLENE GLYCOL 3350 17 G PO PACK: 17.0000 g | PACK | Freq: Every day | ORAL | Status: DC | PRN

## 2020-12-23 MED ORDER — FENTANYL CITRATE (PF) 100 MCG/2ML IJ SOLN
INTRAMUSCULAR | Status: DC | PRN
  Administered 2020-12-23 (×2): 100 ug via INTRAVENOUS

## 2020-12-23 MED ORDER — BISACODYL 5 MG PO TBEC: 5.0000 mg | DELAYED_RELEASE_TABLET | Freq: Every day | ORAL | Status: DC | PRN

## 2020-12-23 MED ORDER — OXYCODONE HCL 5 MG PO TABS
5.0000 mg | ORAL_TABLET | ORAL | Status: DC | PRN
Start: 2020-12-23 — End: 2020-12-24
  Administered 2020-12-24: 5 mg via ORAL
  Filled 2020-12-23: qty 1

## 2020-12-23 MED ORDER — PROMETHAZINE HCL 25 MG/ML IJ SOLN: 6.2500 mg | INTRAMUSCULAR | Status: DC | PRN

## 2020-12-23 MED ORDER — ORAL CARE MOUTH RINSE: 15.0000 mL | Freq: Once | OROMUCOSAL | Status: AC

## 2020-12-23 MED ORDER — DEXAMETHASONE SODIUM PHOSPHATE 10 MG/ML IJ SOLN
INTRAMUSCULAR | Status: DC | PRN
  Administered 2020-12-23: 10 mg via INTRAVENOUS

## 2020-12-23 MED ORDER — HEMOSTATIC AGENTS (NO CHARGE) OPTIME
TOPICAL | Status: DC | PRN
  Administered 2020-12-23: 1 via TOPICAL

## 2020-12-23 MED ORDER — SODIUM CHLORIDE 0.9% FLUSH: 3.0000 mL | INTRAVENOUS | Status: DC | PRN

## 2020-12-23 MED ORDER — SENNA 8.6 MG PO TABS
1.0000 | ORAL_TABLET | Freq: Two times a day (BID) | ORAL | Status: DC
  Administered 2020-12-23 – 2020-12-24 (×2): 8.6 mg via ORAL
  Filled 2020-12-23 (×2): qty 1

## 2020-12-23 MED ORDER — HYDROCHLOROTHIAZIDE 12.5 MG PO CAPS
12.5000 mg | ORAL_CAPSULE | Freq: Every day | ORAL | Status: DC
  Administered 2020-12-24: 12.5 mg via ORAL
  Filled 2020-12-23: qty 1

## 2020-12-23 SURGICAL SUPPLY — 66 items
BIT DRILL CENTERPIECE (BIT) ×2 IMPLANT
BOOT SUTURE AID YELLOW STND (SUTURE) IMPLANT
BUR NEURO DRILL SOFT 3.0X3.8M (BURR) ×3 IMPLANT
BUR SABER DIAMOND 3.0 (BURR) IMPLANT
CHLORAPREP W/TINT 26 (MISCELLANEOUS) ×6 IMPLANT
COUNTER NEEDLE 20/40 LG (NEEDLE) ×3 IMPLANT
COVER LIGHT HANDLE STERIS (MISCELLANEOUS) ×6 IMPLANT
CUP MEDICINE 2OZ PLAST GRAD ST (MISCELLANEOUS) ×6 IMPLANT
DERMABOND ADVANCED (GAUZE/BANDAGES/DRESSINGS) ×1
DERMABOND ADVANCED .7 DNX12 (GAUZE/BANDAGES/DRESSINGS) ×2 IMPLANT
DRAPE C-ARM 42X72 X-RAY (DRAPES) ×3 IMPLANT
DRAPE C-ARMOR (DRAPES) ×3 IMPLANT
DRAPE INCISE IOBAN 66X45 STRL (DRAPES) ×3 IMPLANT
DRAPE MICROSCOPE SPINE 48X150 (DRAPES) IMPLANT
DRAPE THYROID T SHEET (DRAPES) ×3 IMPLANT
DRILL BIT CENTERPIECE (BIT) ×3
DRSG OPSITE POSTOP 4X6 (GAUZE/BANDAGES/DRESSINGS) IMPLANT
DRSG OPSITE POSTOP 4X8 (GAUZE/BANDAGES/DRESSINGS) IMPLANT
DRSG TEGADERM 4X4.75 (GAUZE/BANDAGES/DRESSINGS) ×3 IMPLANT
ELECT CAUTERY BLADE TIP 2.5 (TIP) ×3
ELECTRODE CAUTERY BLDE TIP 2.5 (TIP) ×2 IMPLANT
FEE INTRAOP CADWELL SUPPLY NCS (MISCELLANEOUS) ×2 IMPLANT
FEE INTRAOP MONITOR IMPULS NCS (MISCELLANEOUS) ×2 IMPLANT
GAUZE 4X4 16PLY ~~LOC~~+RFID DBL (SPONGE) ×3 IMPLANT
GAUZE SPONGE 4X4 12PLY STRL (GAUZE/BANDAGES/DRESSINGS) ×3 IMPLANT
GAUZE XEROFORM 1X8 LF (GAUZE/BANDAGES/DRESSINGS) ×3 IMPLANT
GLOVE SRG 8 PF TXTR STRL LF DI (GLOVE) ×2 IMPLANT
GLOVE SURG SYN 6.5 ES PF (GLOVE) ×6 IMPLANT
GLOVE SURG SYN 8.0 (GLOVE) ×6 IMPLANT
GLOVE SURG UNDER POLY LF SZ6.5 (GLOVE) ×6 IMPLANT
GLOVE SURG UNDER POLY LF SZ8 (GLOVE) ×1
GOWN SRG LRG LVL 4 IMPRV REINF (GOWNS) ×4 IMPLANT
GOWN STRL REIN LRG LVL4 (GOWNS) ×2
GOWN STRL REUS W/ TWL XL LVL3 (GOWN DISPOSABLE) ×4 IMPLANT
GOWN STRL REUS W/TWL XL LVL3 (GOWN DISPOSABLE) ×2
GRADUATE 1200CC STRL 31836 (MISCELLANEOUS) ×3 IMPLANT
HEMOSTAT SURGICEL 2X3 (HEMOSTASIS) IMPLANT
HEMOVAC 400CC 10FR (MISCELLANEOUS) ×3 IMPLANT
HOLDER FOLEY CATH W/STRAP (MISCELLANEOUS) ×3 IMPLANT
INTRAOP CADWELL SUPPLY FEE NCS (MISCELLANEOUS) ×2
INTRAOP DISP SUPPLY FEE NCS (MISCELLANEOUS) ×1
INTRAOP MONITOR FEE IMPULS NCS (MISCELLANEOUS) ×2
INTRAOP MONITOR FEE IMPULSE (MISCELLANEOUS) ×1
KIT TURNOVER KIT A (KITS) ×3 IMPLANT
MANIFOLD NEPTUNE II (INSTRUMENTS) ×3 IMPLANT
MARKER SKIN DUAL TIP RULER LAB (MISCELLANEOUS) ×9 IMPLANT
NEEDLE HYPO 22GX1.5 SAFETY (NEEDLE) ×3 IMPLANT
PACK LAMINECTOMY NEURO (CUSTOM PROCEDURE TRAY) ×3 IMPLANT
PAD ARMBOARD 7.5X6 YLW CONV (MISCELLANEOUS) ×3 IMPLANT
PIN MAYFIELD SKULL DISP (PIN) ×3 IMPLANT
SPONGE T-LAP 4X18 ~~LOC~~+RFID (SPONGE) ×3 IMPLANT
STAPLER SKIN PROX 35W (STAPLE) ×3 IMPLANT
SURGIFLO W/THROMBIN 8M KIT (HEMOSTASIS) ×3 IMPLANT
SUT BONE WAX W31G (SUTURE) IMPLANT
SUT ETHILON 3 0 PS 1 (SUTURE) ×3 IMPLANT
SUT NURALON 4 0 TR CR/8 (SUTURE) ×3 IMPLANT
SUT POLYSORB 2-0 5X18 GS-10 (SUTURE) ×12 IMPLANT
SUT PROLENE 5 0 RB 2 (SUTURE) IMPLANT
SUT VIC AB 0 CT1 18XCR BRD 8 (SUTURE) ×4 IMPLANT
SUT VIC AB 0 CT1 8-18 (SUTURE) ×2
SYR 30ML LL (SYRINGE) ×3 IMPLANT
TAPE CLOTH 3X10 WHT NS LF (GAUZE/BANDAGES/DRESSINGS) ×3 IMPLANT
TOWEL OR 17X26 4PK STRL BLUE (TOWEL DISPOSABLE) ×6 IMPLANT
TRAY FOLEY MTR SLVR 16FR STAT (SET/KITS/TRAYS/PACK) ×3 IMPLANT
TUBING CONNECTING 10 (TUBING) ×3 IMPLANT
WATER STERILE IRR 500ML POUR (IV SOLUTION) ×3 IMPLANT

## 2020-12-23 NOTE — Interval H&P Note (Signed)
History and Physical Interval Note:  12/23/2020 9:41 AM  Tom Barrett  has presented today for surgery, with the diagnosis of Cervical spinal stenosis M48.02.  The various methods of treatment have been discussed with the patient and family. After consideration of risks, benefits and other options for treatment, the patient has consented to  Procedure(s): C3-6 LAMINOPLASTY (N/A) as a surgical intervention.  The patient's history has been reviewed, patient examined, no change in status, stable for surgery.  I have reviewed the patient's chart and labs.  Questions were answered to the patient's satisfaction.     Deetta Perla

## 2020-12-23 NOTE — Discharge Summary (Signed)
Physician Discharge Summary  Patient ID: Tom Barrett MRN: [id number] DOB/AGE: 1960-10-30 60 y.o.  Admit date: 12/23/2020 Discharge date: 12/24/2020  Admission Diagnoses: Cervical stenosis  Discharge Diagnoses:  Active Problems:   Cervical stenosis of spinal canal   Discharged Condition: good  Hospital Course: Tom Barrett is a 60 y.o presenting for cervical stenosis s/p C3-6 lamectomies for decompression. His interoperative course was medically uncomplicated and he was admitted overnight for monitoring of drain output and pain control. He was seen by PT and OT and deemed appropriate for discharge home. His drain output was monitored and this was removed when output reduced to an acceptable level. He was discharged home with Celebrex, Oxycodone, Senna, and Robaxin.   Consults:  PT/OT  Significant Diagnostic Studies: none  Treatments: surgery: As above. Please see separately dictated operative report for further details  Discharge Exam: Blood pressure (!) 160/82, pulse 87, temperature 98.1 F (36.7 C), resp. rate 16, height 5\' 11"  (1.803 m), weight 111.1 kg, SpO2 95 %. CN II-XII grossly intact A&Ox3 5/5 throughout Incision c/d/I with staples in place  Disposition: Discharge disposition: 01-Home or Self Care      Discharge Instructions     Discharge wound care:   Complete by: As directed    Discussed with pt and in d/c instructions   Incentive spirometry RT   Complete by: As directed       Allergies as of 12/24/2020       Reactions   Amlodipine Other (See Comments)   Felt terrible Unable to sleep   Metoprolol Other (See Comments)   Felt terrible Unable to sleep   Penicillins Other (See Comments)   RECEIVED CEFAZOLIN WITHOUT ISSUE 10/10/19 Has patient had a PCN reaction causing immediate rash, facial/tongue/throat swelling, SOB or lightheadedness with hypotension: Unknown Has patient had a PCN reaction causing severe rash involving mucus membranes or skin  necrosis: Unknown Has patient had a PCN reaction that required hospitalization: Unknown Has patient had a PCN reaction occurring within the last 10 years: No If all of the above answers are "NO", then may proceed with Cephalospor        Medication List     TAKE these medications    acetaminophen 325 MG tablet Commonly known as: TYLENOL Take 2 tablets (650 mg total) by mouth every 4 (four) hours as needed for mild pain ((score 1 to 3) or temp > 100.5).   celecoxib 200 MG capsule Commonly known as: CELEBREX Take 1 capsule (200 mg total) by mouth every 12 (twelve) hours.   diltiazem 300 MG 24 hr capsule Commonly known as: CARDIZEM CD Take 300 mg by mouth at bedtime.   doxazosin 8 MG tablet Commonly known as: CARDURA Take 8 mg by mouth at bedtime.   gabapentin 300 MG capsule Commonly known as: NEURONTIN Take 300 mg by mouth 3 (three) times daily.   hydrochlorothiazide 12.5 MG capsule Commonly known as: MICROZIDE Take 12.5 mg by mouth daily.   hydrocortisone 2.5 % cream Apply 1 application topically 2 (two) times daily as needed (hemorrhoids).   levothyroxine 137 MCG tablet Commonly known as: SYNTHROID Take 274 mcg by mouth daily before breakfast.   methocarbamol 500 MG tablet Commonly known as: ROBAXIN Take 1 tablet (500 mg total) by mouth every 6 (six) hours.   multivitamin with minerals Tabs tablet Take 1 tablet by mouth daily.   oxyCODONE 5 MG immediate release tablet Commonly known as: Oxy IR/ROXICODONE Take 1 tablet (5 mg total) by mouth every 4 (  four) hours as needed for up to 7 days for moderate pain ((score 4 to 6)).   potassium chloride 10 MEQ tablet Commonly known as: KLOR-CON TAKE 1 TABLET BY MOUTH TWICE DAILY What changed:  how much to take when to take this additional instructions   quinapril 40 MG tablet Commonly known as: ACCUPRIL TAKE ONE TABLET TWICE DAILY   senna 8.6 MG Tabs tablet Commonly known as: SENOKOT Take 1 tablet (8.6 mg  total) by mouth 2 (two) times daily.   tadalafil 5 MG tablet Commonly known as: CIALIS Take 15 mg by mouth daily as needed for erectile dysfunction.               Discharge Care Instructions  (From admission, onward)           Start     Ordered   12/24/20 0000  Discharge wound care:       Comments: Discussed with pt and in d/c instructions   12/24/20 1534             Signed: Loleta Dicker 12/24/2020, 3:39 PM
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
## 2020-12-23 NOTE — Anesthesia Procedure Notes (Signed)
Procedure Name: Intubation Date/Time: 12/23/2020 10:33 AM Performed by: Juliene Pina, CRNA Pre-anesthesia Checklist: Patient identified, Patient being monitored, Timeout performed, Emergency Drugs available and Suction available Patient Re-evaluated:Patient Re-evaluated prior to induction Oxygen Delivery Method: Circle system utilized Preoxygenation: Pre-oxygenation with 100% oxygen Induction Type: IV induction Ventilation: Mask ventilation without difficulty Laryngoscope Size: McGraph and 4 Grade View: Grade I Tube type: Oral Tube size: 7.5 mm Number of attempts: 1 Airway Equipment and Method: Stylet Placement Confirmation: ETT inserted through vocal cords under direct vision, positive ETCO2 and breath sounds checked- equal and bilateral Secured at: 23 cm Tube secured with: Tape Dental Injury: Teeth and Oropharynx as per pre-operative assessment

## 2020-12-23 NOTE — Progress Notes (Signed)
PHARMACY -  BRIEF ANTIBIOTIC NOTE   Pharmacy has received consult(s) for Cefazolin from an OR provider.  The patient's profile has been reviewed for ht/wt/allergies/indication/available labs.    One time order(s) placed for Cefazolin 2 gm preop  Further antibiotics/pharmacy consults should be ordered by admitting physician if indicated.                       Thank you, Renda Rolls, PharmD, [facility] 12/23/2020 2:13 AM

## 2020-12-23 NOTE — Transfer of Care (Signed)
Immediate Anesthesia Transfer of Care Note  Patient: Tom Barrett  Procedure(s) Performed: POSTERIOR CERVICAL LAMINECTOMY C3-6  Patient Location: PACU  Anesthesia Type:General  Level of Consciousness: drowsy  Airway & Oxygen Therapy: Patient Spontanous Breathing and Patient connected to face mask oxygen  Post-op Assessment: Report given to RN and Post -op Vital signs reviewed and stable  Post vital signs: stable  Last Vitals:  Vitals Value Taken Time  BP 157/96 12/23/20 1400  Temp    Pulse 97 12/23/20 1405  Resp 15 12/23/20 1405  SpO2 98 % 12/23/20 1405  Vitals shown include unvalidated device data.  Last Pain:  Vitals:   12/23/20 0829  TempSrc: Temporal  PainSc: 0-No pain         Complications: No notable events documented.
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
## 2020-12-23 NOTE — Progress Notes (Signed)
Notified Dr. Rosey Bath of potassium level of 3.2 one week ago. MD ok to proceed without a redraw.

## 2020-12-23 NOTE — Op Note (Signed)
Operative Note   SURGERY DATE:  12/23/2020   PRE-OP DIAGNOSIS: Cervical spinal stenosis with myelopathy   POST-OP DIAGNOSIS: Post-Op Diagnosis Codes: Cervical spinal stenosis with myelopathy   Procedure(s) with comments: C3, C4, C5, C6 laminectomy   SURGEON:     * Malen Gauze, MD       Tom Render, PA Assistant   ANESTHESIA: General    OPERATIVE FINDINGS:   Indications Tom Barrett was seen in clinic on 8/16 with ongoing symptoms of numbness in his arms and legs with weakness.  MRI revealed cervical spinal stenosis in the mid cervical area worse at C4-5 and C5-6.  Given this we recommended a posterior cervical decompression and laminoplasty to relieve the pressure on his spinal cord. The risks of hematoma, infection, poor bone healing, failure of fusion, cord injury, weakness, numbness, neck pain, stroke, and death were discussed in detail. All questions were answered and the patient elected to proceed with the surgery.    Procedure After obtaining informed consent, the patient was taken to the Operating Room where general anesthesia was induced and the patient intubated.  A Foley catheter was placed.  Vascular access was obtained including central line. The Mayfield pins were applied, and the patient was positioned prone with neck in a neutral position with slight extension on a standard OR table with appropriate padding of pressure points. Neuromonitoring electrodes had been placed and adequate baselines for MEP and SSEP obtained before and after the flip.  Fluoroscopy confirmed the planned incision.    The patient was prepped and draped in the usual sterile fashion and a timeout was performed per protocol. Local anesthesia was instilled with epinephrine along the planned incision site. A midline incision was performed from C2-C7. The incision was carried to the level of the fascia and then to the lamina from C3 to C6 taking care not to disrupt the musculature and ligament adjacent to  these levels.    Next, the decompression was performed. A high speed drill with 16mm bit was used to drill the lamina laterally on each side from C3 to C6 to expose the ligament.  This was completely removed on the left and partially moved on the right in order to allow for the lamina to be lifted to the right side.  We were able to elevate the left side but as we started to place the laminoplasty plates, the motor evoked potentials did show some decrease in the left lower extremity.  The lamina surface was not adequate for the plating and therefore the decision was made to proceed with a decompression consisting of a laminectomy at C3-C6.  The remaining bone and ligament were removed along the lamina/lateral mass interface on the right side and the lamina were removed using dissection technique as one piece exposing the dura beneath. There were areas of compression laterally and these were removed with Kerrison rongeur until the dura was without compression. Hemostasis was then achieved and the wound was irrigated profusely.  Monitoring showed a return to his baseline MEPs.   A medium hemovac was tunneled inferiorly through the skin. The muscle and fascia were closed with 0 vicryl suture. The subcutaneous and dermis was closed with 2-0 and 3-0 vicryl suture. The skin was closed with staples. A dressing was placed.    The patient was returned to supine position and head fixation removed. The patient had general anesthesia reversed and was extubated following the procedure. He was taken to the PACU where he continued his recovery  and was seen to be at his baseline. Following the procedure, I spoke with the patient's family about the procedure and answered all questions.   ESTIMATED BLOOD LOSS:   100 cc   SPECIMENS None   IMPLANT None     I performed the case in its entirety with assistance of Tom Barrett, [place],    Deetta Perla, [place]

## 2020-12-24 ENCOUNTER — Encounter: Payer: Self-pay | Admitting: Neurosurgery

## 2020-12-24 DIAGNOSIS — M4802 Spinal stenosis, cervical region: Secondary | ICD-10-CM | POA: Diagnosis not present

## 2020-12-24 DIAGNOSIS — Z87891 Personal history of nicotine dependence: Secondary | ICD-10-CM | POA: Diagnosis not present

## 2020-12-24 DIAGNOSIS — E039 Hypothyroidism, unspecified: Secondary | ICD-10-CM | POA: Diagnosis not present

## 2020-12-24 DIAGNOSIS — Z96653 Presence of artificial knee joint, bilateral: Secondary | ICD-10-CM | POA: Diagnosis not present

## 2020-12-24 DIAGNOSIS — I10 Essential (primary) hypertension: Secondary | ICD-10-CM | POA: Diagnosis not present

## 2020-12-24 MED ORDER — OXYCODONE HCL 5 MG PO TABS: 5.0000 mg | ORAL_TABLET | ORAL | 0 refills | Status: AC | PRN

## 2020-12-24 MED ORDER — CELECOXIB 200 MG PO CAPS: 200.0000 mg | ORAL_CAPSULE | Freq: Two times a day (BID) | ORAL | 0 refills | Status: AC

## 2020-12-24 MED ORDER — METHOCARBAMOL 500 MG PO TABS: 500.0000 mg | ORAL_TABLET | Freq: Four times a day (QID) | ORAL | 0 refills | Status: AC

## 2020-12-24 MED ORDER — ACETAMINOPHEN 325 MG PO TABS: 650.0000 mg | ORAL_TABLET | ORAL | 0 refills | Status: AC | PRN

## 2020-12-24 MED ORDER — SENNA 8.6 MG PO TABS: 1.0000 | ORAL_TABLET | Freq: Two times a day (BID) | ORAL | 0 refills | Status: AC

## 2020-12-24 NOTE — Evaluation (Signed)
Occupational Therapy Evaluation Patient Details Name: Tom Barrett MRN: [id number] DOB: May 11, 1960 Today's Date: 12/24/2020   History of Present Illness 60 y/o male s/p C3=C6 laminectomy 10/24.   Clinical Impression   Tom Barrett was seen for OT evaluation this date. Prior to hospital admission, pt was MOD I for mobility and ADLs, requires sock aid and wife assistance for buttons/zippers/belts/etc. Pt lives with spouse in home c accessible bathroom. Pt demonstrates near baseline independence to perform ADL and mobility tasks. Pt demonstrates decreased LUE sensation in the ulnar distribution (pt reports new in recent months). Pt requires MIN A for LB access seated EOC - per wife this is baseline and pt uses sock aid. SUPERVISION for ADL t/f w/o AD. Anticipate SETUP only for standing grooming tasks - instructed in adapative strategies. Pt educated in functional application of cervical precautions (no formal precautions per MD), AE/DME for bathing/dressing/toileting, and home/routines modifications. Pt verbalized understanding of all education/training provided. No skilled OT needs identified, pt may benefit from outpatient OT to address baseline fine motor/sensation deficits. Will sign off. Please re-consult if additional OT needs arise.        Recommendations for follow up therapy are one component of a multi-disciplinary discharge planning process, led by the attending physician.  Recommendations may be updated based on patient status, additional functional criteria and insurance authorization.   Follow Up Recommendations  Outpatient OT    Assistance Recommended at Discharge PRN  Functional Status Assessment  Patient has had a recent decline in their functional status and demonstrates the ability to make significant improvements in function in a reasonable and predictable amount of time.  Equipment Recommendations  None recommended by OT    Recommendations for Other Services        Precautions / Restrictions Precautions Precautions: Fall (reviewed cervical spine concerns, no formal precautions per neuro) Restrictions Weight Bearing Restrictions: No      Mobility Bed Mobility Overal bed mobility: Needs Assistance Bed Mobility: Supine to Sit     Supine to sit: Min assist     General bed mobility comments: received and left in chair    Transfers Overall transfer level: Needs assistance Equipment used: None Transfers: Sit to/from Stand Sit to Stand: Supervision           General transfer comment: requires 2 trials to achieve standing, unable to rise w/o use of armrests      Balance Overall balance assessment: Needs assistance   Sitting balance-Tom Barrett Scale: Good     Standing balance support: No upper extremity supported Standing balance-Tom Barrett Scale: Fair Standing balance comment: Pt with some unsteadiness but no LOBs. Able to statically stand w/o UE support, but clearly safer and more stable with cane and then even more so with walker.                           ADL either performed or assessed with clinical judgement   ADL Overall ADL's : Needs assistance/impaired                                       General ADL Comments: MIN A for LB access seated EOC - per wife this is baseline and pt uses sock aid. SUPERVISION for ADL t/f w/o AD. Anticipate SETUP only for standing grooming tasks - instructed in adapative strategies.      Pertinent Vitals/Pain Pain Assessment:  Faces Pain Score: 6  Faces Pain Scale: Hurts little more Pain Location: neck Pain Descriptors / Indicators: Discomfort;Dull;Grimacing Pain Intervention(s): Limited activity within patient's tolerance;Repositioned     Hand Dominance Right   Extremity/Trunk Assessment Upper Extremity Assessment Upper Extremity Assessment: LUE deficits/detail;Overall WFL for tasks assessed LUE Sensation: decreased light touch   Lower Extremity Assessment Lower  Extremity Assessment: Generalized weakness       Communication Communication Communication: No difficulties   Cognition Arousal/Alertness: Awake/alert Behavior During Therapy: WFL for tasks assessed/performed Overall Cognitive Status: Within Functional Limits for tasks assessed                                       General Comments       Exercises Exercises: Other exercises Other Exercises Other Exercises: Pt educated re: OT role, DME recs, d/c recs, falls prevention, ECS, adapted grooming strategies Other Exercises: LBD, sit<>stand, sitting/standing balace/tolerance, funcitonal reach   Shoulder Instructions      Home Living Family/patient expects to be discharged to:: Private residence Living Arrangements: Spouse/significant other Available Help at Discharge: Family;Available PRN/intermittently Type of Home: House Home Access: Stairs to enter [employer] of Steps: 5 Entrance Stairs-Rails: Right;Left Home Layout: Able to live on main level with bedroom/bathroom     Bathroom Shower/Tub: Occupational psychologist: Handicapped height     Home Equipment: Conservation officer, nature (2 wheels)          Prior Functioning/Environment Prior Level of Function : Independent/Modified Independent             Mobility Comments: Pt has been needing SPC recently due to weakness/unsteadiness ADLs Comments: Uses sock aid and wife assists with belts/buttons/zippers        OT Problem List: Impaired sensation;Decreased range of motion         OT Goals(Current goals can be found in the care plan section) Acute Rehab OT Goals Patient Stated Goal: to go home today OT Goal Formulation: With patient/family Time For Goal Achievement: 01/07/21 Potential to Achieve Goals: Good   AM-PAC OT "6 Clicks" Daily Activity     Outcome Measure Help from another person eating meals?: None Help from another person taking care of personal grooming?: A Little Help  from another person toileting, which includes using toliet, bedpan, or urinal?: A Little Help from another person bathing (including washing, rinsing, drying)?: None Help from another person to put on and taking off regular upper body clothing?: A Little Help from another person to put on and taking off regular lower body clothing?: A Little 6 Click Score: 20   End of Session    Activity Tolerance: Patient tolerated treatment well Patient left: in chair;with call bell/phone within reach;with family/visitor present  OT Visit Diagnosis: Other abnormalities of gait and mobility (R26.89)                Time: 7371-0626 OT Time Calculation (min): 20 min Charges:  OT General Charges $OT Visit: 1 Visit OT Evaluation $OT Eval Moderate Complexity: 1 Mod  Dessie Coma, M.S. OTR/L  12/24/20, 2:14 PM  ascom [phone]

## 2020-12-24 NOTE — Plan of Care (Signed)

## 2020-12-24 NOTE — TOC Initial Note (Addendum)
Transition of Care [facility]) - Initial/Assessment Note    Patient Details  Name: Tom Barrett MRN: [id number] Date of Birth: 1960/08/13  Transition of [facility]) CM/SW Contact:    Pete Pelt, RN Phone Number: 12/24/2020, 1:59 PM  Clinical Narrative:    Patient lives at home with wife, no concerns about transporting to appointments, as wife and brother can assist with this and obtaining meds from pharmacy.  Patient is able to take medications as directed.    AT this time, patient has no DME recommendations, and TOC will follow for futher recommendations and assistance.   Patient has no current concerns about returning home after discharge.              Addendum:  Patient is discharging today.  As per discharge note patient will go home with self care.  Expected Discharge Plan:  (TBD) Barriers to Discharge: Continued Medical Work up   Patient Goals and CMS Choice     Choice offered to / list presented to : NA  Expected Discharge Plan and Services Expected Discharge Plan:  (TBD)   Discharge Planning Services: CM Consult Post Acute Care Choice:  (TBD) Living arrangements for the past 2 months: Single Family Home                   DME Agency:  (none recommended at the time of this assessment)       HH Arranged:  (TBD)          Prior Living Arrangements/Services Living arrangements for the past 2 months: Single Family Home Lives with:: Self, Spouse   Do you feel safe going back to the place where you live?: Yes      Need for Family Participation in Patient Care: Yes (Comment) Care giver support system in place?: Yes (comment) Current home services:  (n/a) Criminal Activity/Legal Involvement Pertinent to Current Situation/Hospitalization: No - Comment as needed  Activities of Daily Living Home Assistive Devices/Equipment: Cane (specify quad or straight) ADL Screening (condition at time of admission) Patient's cognitive ability adequate to safely complete daily  activities?: Yes Is the patient deaf or have difficulty hearing?: No Does the patient have difficulty seeing, even when wearing glasses/contacts?: No Does the patient have difficulty concentrating, remembering, or making decisions?: No Patient able to express need for assistance with ADLs?: Yes Does the patient have difficulty dressing or bathing?: Yes (possible assist for rest of today) Independently performs ADLs?: Yes (appropriate for developmental age) (Baseline) Does the patient have difficulty walking or climbing stairs?: No Weakness of Legs: None Weakness of Arms/Hands: None  Permission Sought/Granted Permission sought to share information with : Case Manager Permission granted to share information with : Yes, Verbal Permission Granted              Emotional Assessment Appearance:: Appears stated age Attitude/Demeanor/Rapport: Engaged, Gracious Affect (typically observed): Pleasant, Apprehensive Orientation: : Oriented to Self, Oriented to Place, Oriented to  Time, Oriented to Situation Alcohol / Substance Use: Not Applicable Psych Involvement: No (comment)  Admission diagnosis:  Cervical stenosis of spinal canal [M48.02] Patient Active Problem List   Diagnosis Date Noted   Cervical stenosis of spinal canal 12/23/2020   Primary osteoarthritis of left knee 04/01/2018   Status post total knee replacement using cement, right 11/09/2017   PCP:  Romualdo Bolk, FNP Pharmacy:   [place], [place] - [address] [place] [address] Phone: [phone] Fax: [phone]  Social Determinants of Health (SDOH) Interventions    Readmission Risk Interventions No flowsheet data found.   

## 2020-12-24 NOTE — Anesthesia Postprocedure Evaluation (Signed)
Anesthesia Post Note  Patient: Tom Barrett  Procedure(s) Performed: POSTERIOR CERVICAL LAMINECTOMY C3-6  Patient location during evaluation: PACU Anesthesia Type: General Level of consciousness: awake and alert Pain management: pain level controlled Vital Signs Assessment: post-procedure vital signs reviewed and stable Respiratory status: spontaneous breathing, nonlabored ventilation, respiratory function stable and patient connected to nasal cannula oxygen Cardiovascular status: blood pressure returned to baseline and stable Postop Assessment: no apparent nausea or vomiting Anesthetic complications: no   No notable events documented.   Last Vitals:  Vitals:   12/24/20 0723 12/24/20 1531  BP: (!) 146/84 (!) 160/82  Pulse: 100 87  Resp: 16 16  Temp: 36.7 C 36.7 C  SpO2: 96% 95%    Last Pain:  Vitals:   12/24/20 1355  TempSrc:   PainSc: 4                  Martha Clan

## 2020-12-24 NOTE — Discharge Instructions (Signed)
NEUROSURGERY DISCHARGE INSTRUCTIONS  Admission diagnosis: Cervical stenosis of spinal canal [M48.02]  Operative procedure: C3-C6 lumbar laminectomies   What to do after you leave the hospital:  Recommended diet: regular diet. Increase protein intake to promote wound healing.  Recommended activity: no lifting, driving, or strenuous exercise for 4 weeks . You should walk multiple times per day  Special Instructions  No straining, no heavy lifting > 10lbs x 4 weeks.  Keep incision area clean and dry. May shower in tomorrow. No baths or pools for 6 weeks.  Please remove dressing tomorrow, no need to apply a bandage afterwards  You have sutures or staples that will be removed in clinic.   Please take pain medications as directed. Take a stool softener if on pain medications   Please Report any of the following: Nausea or Vomiting, Temperature is greater than 101.80F (38.1C) degrees, Dizziness, Abdominal Pain, Difficulty Breathing or Shortness of Breath, Inability to Eat, drink Fluids, or Take medications, Bleeding, swelling, or drainage from surgical incision sites, New numbness or weakness, and Bowel or bladder dysfunction to the neurosurgeon on call at [phone]  Additional Follow up appointments Please follow up with Cooper Render PA-C in [place] clinic as scheduled in 2-3 weeks   Please see below for scheduled appointments:  No future appointments.

## 2020-12-24 NOTE — Evaluation (Signed)
Physical Therapy Evaluation Patient Details Name: Tom Barrett MRN: [id number] DOB: 08/29/1960 Today's Date: 12/24/2020  History of Present Illness  60 y/o male s/p C3=C6 laminectomy 10/24.  Clinical Impression  Pt eager to work with PT and did relatively well with ambulation, stair negotiation.  He did have some issues with getting to EOB from supine and needed light assist to get to sitting EOB position.  Educated on positioning and equipment/set up to help manage this at home.  He was able to circumambulate the nurses' station with AD (better with walker, safe but slower with SPC.  Questions answered, good overall effort and should be safe for d/c.       Recommendations for follow up therapy are one component of a multi-disciplinary discharge planning process, led by the attending physician.  Recommendations may be updated based on patient status, additional functional criteria and insurance authorization.  Follow Up Recommendations Follow physician's recommendations for discharge plan and follow up therapies    Assistance Recommended at Discharge PRN  Functional Status Assessment Patient has had a recent decline in their functional status and demonstrates the ability to make significant improvements in function in a reasonable and predictable amount of time.  Equipment Recommendations  None recommended by PT    Recommendations for Other Services       Precautions / Restrictions Precautions Precautions: Fall (reviewed cervical spine concerns, no formal precautions per neuro) Restrictions Weight Bearing Restrictions: No      Mobility  Bed Mobility Overal bed mobility: Needs Assistance Bed Mobility: Supine to Sit     Supine to sit: Min assist     General bed mobility comments: Pt showed good effort to get to EOB but could not manage w/o assist.  Light HHA to pull as well as min assist to elevate torso.  Able to scoot in bed once upright    Transfers Overall transfer  level: Modified independent Equipment used: Straight cane               General transfer comment: Pt did need some explanation for UE use and set up, but was ultimately able to rise w/o assist    Ambulation/Gait Ambulation/Gait assistance: Min guard Gait Distance (Feet): 200 Feet Assistive device: Rolling walker (2 wheels);Straight cane       General Gait Details: Pt was able to ambulate ~15 ft with cane but was very slow and guarded.  No LOBs, but we decided to use RW and he showed improved confidence, safety and speed with AD.  He showed good overall tolerance, minimally increased pain with  Stairs Stairs: Yes Stairs assistance: Min guard Stair Management: One rail Left;Sideways Number of Stairs: 4 General stair comments: Pt able to ascend and descend steps with b/l UE support.  Minimal cuing, no buckling/safety issues.  Wheelchair Mobility    Modified Rankin (Stroke Patients Only)       Balance Overall balance assessment: Needs assistance   Sitting balance-Leahy Scale: Good     Standing balance support: Bilateral upper extremity supported Standing balance-Leahy Scale: Fair Standing balance comment: Pt with some unsteadiness but no LOBs. Able to statically stand w/o UE support, but clearly safer and more stable with cane and then even more so with walker.                             Pertinent Vitals/Pain Pain Assessment: 0-10 Pain Score: 6     Home Living Family/patient expects to  be discharged to:: Private residence Living Arrangements: Spouse/significant other Available Help at Discharge:  (wife works but can be around as needed)   Home Access: Stairs to enter Entrance Stairs-Rails: Right;Left (wide) Entrance Stairs-Number of Steps: [place]: Able to live on main level with bedroom/bathroom Home Equipment: Conservation officer, nature (2 wheels)      Prior Function Prior Level of Function : Independent/Modified Independent              Mobility Comments: Pt has been needing SPC recently due to weakness/unsteadiness       Hand Dominance        Extremity/Trunk Assessment   Upper Extremity Assessment Upper Extremity Assessment: Overall WFL for tasks assessed (reports mild numbness in L UE)    Lower Extremity Assessment Lower Extremity Assessment: Generalized weakness;Overall WFL for tasks assessed       Communication      Cognition Arousal/Alertness: Awake/alert Behavior During Therapy: WFL for tasks assessed/performed Overall Cognitive Status: Within Functional Limits for tasks assessed                                          General Comments      Exercises     Assessment/Plan    PT Assessment Patient needs continued PT services  PT Problem List Decreased strength;Decreased activity tolerance;Decreased range of motion;Decreased balance;Decreased mobility;Decreased coordination;Decreased safety awareness;Decreased knowledge of use of DME;Decreased knowledge of precautions;Pain       PT Treatment Interventions DME instruction;Gait training;Stair training;Functional mobility training;Therapeutic activities;Therapeutic exercise;Balance training;Patient/family education    PT Goals (Current goals can be found in the Care Plan section)  Acute Rehab PT Goals Patient Stated Goal: Go home PT Goal Formulation: With patient/family Time For Goal Achievement: 01/07/21 Potential to Achieve Goals: Good    Frequency 7X/week   Barriers to discharge        Co-evaluation               AM-PAC PT "6 Clicks" Mobility  Outcome Measure Help needed turning from your back to your side while in a flat bed without using bedrails?: A Little Help needed moving from lying on your back to sitting on the side of a flat bed without using bedrails?: A Little Help needed moving to and from a bed to a chair (including a wheelchair)?: None Help needed standing up from a chair using your arms (e.g.,  wheelchair or bedside chair)?: None Help needed to walk in hospital room?: None Help needed climbing 3-5 steps with a railing? : None 6 Click Score: 22    End of Session Equipment Utilized During Treatment: Gait belt Activity Tolerance: Patient limited by pain Patient left: with call bell/phone within reach;with family/visitor present;in chair Nurse Communication: Mobility status PT Visit Diagnosis: Muscle weakness (generalized) (M62.81);Difficulty in walking, not elsewhere classified (R26.2);Pain Pain - part of body:  (cervicalgia)    Time: 7517-0017 PT Time Calculation (min) (ACUTE ONLY): 35 min   Charges:   PT Evaluation $PT Eval Low Complexity: 1 Low PT Treatments $Gait Training: 8-22 mins $Therapeutic Activity: 8-22 mins        Kreg Shropshire, DPT 12/24/2020, 1:23 PM

## 2020-12-24 NOTE — Progress Notes (Signed)
    Attending Progress Note  History: Tom Barrett presented with cervical stenosis and early signs of myelopathy s/p C3-6 laminectomies.   POD#1: NAEO. Tolerating PO intake and reports good pain control with PO medications. Continues to have arm numbness and admits concern about balance (imbalance present pre-op)  Physical Exam: Vitals:   12/23/20 2132 12/24/20 0723  BP: (!) 146/88 (!) 146/84  Pulse: (!) 102 100  Resp: 18 16  Temp:  98.1 F (36.7 C)  SpO2:  96%    AA Ox3 CNI Strength:5/5 throughout BUE  HV 75 since surgery 60 overnight  Incision with post-op dressing in place  Data:  No results for input(s): NA, K, CL, CO2, BUN, CREATININE, LABGLOM, GLUCOSE, CALCIUM in the last 168 hours. No results for input(s): AST, ALT, ALKPHOS in the last 168 hours.  Invalid input(s): TBILI   No results for input(s): WBC, HGB, HCT, PLT in the last 168 hours. No results for input(s): APTT, INR in the last 168 hours.       Other tests/results: none   Assessment/Plan:  Tom Barrett presenting for signs of cervical myelopathy and cervical stenosis s/p C3-6 laminectomies.  - mobilize - pain control - DVT prophylaxis; SCDs - PTOT - will reevaluate drain output this afternoon  Cooper Render PA-C Department of Neurosurgery

## 2021-01-06 DIAGNOSIS — M1712 Unilateral primary osteoarthritis, left knee: Secondary | ICD-10-CM | POA: Diagnosis not present

## 2021-01-06 DIAGNOSIS — Z6834 Body mass index (BMI) 34.0-34.9, adult: Secondary | ICD-10-CM | POA: Diagnosis not present

## 2021-01-06 DIAGNOSIS — G5622 Lesion of ulnar nerve, left upper limb: Secondary | ICD-10-CM | POA: Diagnosis not present

## 2021-01-06 DIAGNOSIS — E079 Disorder of thyroid, unspecified: Secondary | ICD-10-CM | POA: Diagnosis not present

## 2021-01-06 DIAGNOSIS — I10 Essential (primary) hypertension: Secondary | ICD-10-CM | POA: Diagnosis not present

## 2021-01-06 DIAGNOSIS — G5601 Carpal tunnel syndrome, right upper limb: Secondary | ICD-10-CM | POA: Diagnosis not present

## 2021-02-19 DIAGNOSIS — E876 Hypokalemia: Secondary | ICD-10-CM | POA: Diagnosis not present

## 2021-03-11 DIAGNOSIS — M5136 Other intervertebral disc degeneration, lumbar region: Secondary | ICD-10-CM | POA: Diagnosis not present

## 2021-03-11 DIAGNOSIS — M5412 Radiculopathy, cervical region: Secondary | ICD-10-CM | POA: Diagnosis not present

## 2021-03-11 DIAGNOSIS — M5030 Other cervical disc degeneration, unspecified cervical region: Secondary | ICD-10-CM | POA: Diagnosis not present

## 2021-03-11 DIAGNOSIS — M4802 Spinal stenosis, cervical region: Secondary | ICD-10-CM | POA: Diagnosis not present

## 2021-03-19 DIAGNOSIS — M48062 Spinal stenosis, lumbar region with neurogenic claudication: Secondary | ICD-10-CM | POA: Diagnosis not present

## 2021-03-19 DIAGNOSIS — M5416 Radiculopathy, lumbar region: Secondary | ICD-10-CM | POA: Diagnosis not present

## 2021-03-21 DIAGNOSIS — E876 Hypokalemia: Secondary | ICD-10-CM | POA: Diagnosis not present

## 2021-03-21 DIAGNOSIS — I10 Essential (primary) hypertension: Secondary | ICD-10-CM | POA: Diagnosis not present

## 2021-03-24 DIAGNOSIS — M6281 Muscle weakness (generalized): Secondary | ICD-10-CM | POA: Diagnosis not present

## 2021-03-24 DIAGNOSIS — M542 Cervicalgia: Secondary | ICD-10-CM | POA: Diagnosis not present

## 2021-03-24 DIAGNOSIS — R262 Difficulty in walking, not elsewhere classified: Secondary | ICD-10-CM | POA: Diagnosis not present

## 2021-03-24 DIAGNOSIS — M545 Low back pain, unspecified: Secondary | ICD-10-CM | POA: Diagnosis not present

## 2021-03-27 DIAGNOSIS — M6281 Muscle weakness (generalized): Secondary | ICD-10-CM | POA: Diagnosis not present

## 2021-03-27 DIAGNOSIS — R262 Difficulty in walking, not elsewhere classified: Secondary | ICD-10-CM | POA: Diagnosis not present

## 2021-03-27 DIAGNOSIS — M545 Low back pain, unspecified: Secondary | ICD-10-CM | POA: Diagnosis not present

## 2021-03-27 DIAGNOSIS — M542 Cervicalgia: Secondary | ICD-10-CM | POA: Diagnosis not present

## 2021-04-01 DIAGNOSIS — M545 Low back pain, unspecified: Secondary | ICD-10-CM | POA: Diagnosis not present

## 2021-04-01 DIAGNOSIS — M542 Cervicalgia: Secondary | ICD-10-CM | POA: Diagnosis not present

## 2021-04-01 DIAGNOSIS — R262 Difficulty in walking, not elsewhere classified: Secondary | ICD-10-CM | POA: Diagnosis not present

## 2021-04-01 DIAGNOSIS — M6281 Muscle weakness (generalized): Secondary | ICD-10-CM | POA: Diagnosis not present

## 2021-04-03 DIAGNOSIS — R262 Difficulty in walking, not elsewhere classified: Secondary | ICD-10-CM | POA: Diagnosis not present

## 2021-04-03 DIAGNOSIS — M542 Cervicalgia: Secondary | ICD-10-CM | POA: Diagnosis not present

## 2021-04-03 DIAGNOSIS — M6281 Muscle weakness (generalized): Secondary | ICD-10-CM | POA: Diagnosis not present

## 2021-04-03 DIAGNOSIS — M545 Low back pain, unspecified: Secondary | ICD-10-CM | POA: Diagnosis not present

## 2021-04-08 DIAGNOSIS — M6281 Muscle weakness (generalized): Secondary | ICD-10-CM | POA: Diagnosis not present

## 2021-04-08 DIAGNOSIS — R262 Difficulty in walking, not elsewhere classified: Secondary | ICD-10-CM | POA: Diagnosis not present

## 2021-04-08 DIAGNOSIS — M545 Low back pain, unspecified: Secondary | ICD-10-CM | POA: Diagnosis not present

## 2021-04-08 DIAGNOSIS — M542 Cervicalgia: Secondary | ICD-10-CM | POA: Diagnosis not present

## 2021-04-09 DIAGNOSIS — M5136 Other intervertebral disc degeneration, lumbar region: Secondary | ICD-10-CM | POA: Diagnosis not present

## 2021-04-09 DIAGNOSIS — M5030 Other cervical disc degeneration, unspecified cervical region: Secondary | ICD-10-CM | POA: Diagnosis not present

## 2021-04-09 DIAGNOSIS — M5416 Radiculopathy, lumbar region: Secondary | ICD-10-CM | POA: Diagnosis not present

## 2021-04-09 DIAGNOSIS — M48062 Spinal stenosis, lumbar region with neurogenic claudication: Secondary | ICD-10-CM | POA: Diagnosis not present

## 2021-04-10 DIAGNOSIS — M545 Low back pain, unspecified: Secondary | ICD-10-CM | POA: Diagnosis not present

## 2021-04-10 DIAGNOSIS — M542 Cervicalgia: Secondary | ICD-10-CM | POA: Diagnosis not present

## 2021-04-10 DIAGNOSIS — R262 Difficulty in walking, not elsewhere classified: Secondary | ICD-10-CM | POA: Diagnosis not present

## 2021-04-10 DIAGNOSIS — M6281 Muscle weakness (generalized): Secondary | ICD-10-CM | POA: Diagnosis not present

## 2021-04-15 DIAGNOSIS — M6281 Muscle weakness (generalized): Secondary | ICD-10-CM | POA: Diagnosis not present

## 2021-04-15 DIAGNOSIS — R262 Difficulty in walking, not elsewhere classified: Secondary | ICD-10-CM | POA: Diagnosis not present

## 2021-04-15 DIAGNOSIS — M545 Low back pain, unspecified: Secondary | ICD-10-CM | POA: Diagnosis not present

## 2021-04-15 DIAGNOSIS — M542 Cervicalgia: Secondary | ICD-10-CM | POA: Diagnosis not present

## 2021-04-17 DIAGNOSIS — M545 Low back pain, unspecified: Secondary | ICD-10-CM | POA: Diagnosis not present

## 2021-04-17 DIAGNOSIS — M6281 Muscle weakness (generalized): Secondary | ICD-10-CM | POA: Diagnosis not present

## 2021-04-17 DIAGNOSIS — M542 Cervicalgia: Secondary | ICD-10-CM | POA: Diagnosis not present

## 2021-04-17 DIAGNOSIS — R262 Difficulty in walking, not elsewhere classified: Secondary | ICD-10-CM | POA: Diagnosis not present

## 2021-04-22 DIAGNOSIS — M6281 Muscle weakness (generalized): Secondary | ICD-10-CM | POA: Diagnosis not present

## 2021-04-22 DIAGNOSIS — M545 Low back pain, unspecified: Secondary | ICD-10-CM | POA: Diagnosis not present

## 2021-04-22 DIAGNOSIS — R262 Difficulty in walking, not elsewhere classified: Secondary | ICD-10-CM | POA: Diagnosis not present

## 2021-04-22 DIAGNOSIS — M542 Cervicalgia: Secondary | ICD-10-CM | POA: Diagnosis not present

## 2021-04-24 DIAGNOSIS — M542 Cervicalgia: Secondary | ICD-10-CM | POA: Diagnosis not present

## 2021-04-24 DIAGNOSIS — M6281 Muscle weakness (generalized): Secondary | ICD-10-CM | POA: Diagnosis not present

## 2021-04-24 DIAGNOSIS — R262 Difficulty in walking, not elsewhere classified: Secondary | ICD-10-CM | POA: Diagnosis not present

## 2021-04-24 DIAGNOSIS — M545 Low back pain, unspecified: Secondary | ICD-10-CM | POA: Diagnosis not present

## 2021-04-30 DIAGNOSIS — E876 Hypokalemia: Secondary | ICD-10-CM | POA: Diagnosis not present

## 2021-05-06 DIAGNOSIS — M545 Low back pain, unspecified: Secondary | ICD-10-CM | POA: Diagnosis not present

## 2021-05-06 DIAGNOSIS — M6281 Muscle weakness (generalized): Secondary | ICD-10-CM | POA: Diagnosis not present

## 2021-05-06 DIAGNOSIS — M542 Cervicalgia: Secondary | ICD-10-CM | POA: Diagnosis not present

## 2021-05-06 DIAGNOSIS — R262 Difficulty in walking, not elsewhere classified: Secondary | ICD-10-CM | POA: Diagnosis not present

## 2021-07-15 DIAGNOSIS — I10 Essential (primary) hypertension: Secondary | ICD-10-CM | POA: Diagnosis not present

## 2021-07-15 DIAGNOSIS — Z125 Encounter for screening for malignant neoplasm of prostate: Secondary | ICD-10-CM | POA: Diagnosis not present

## 2021-07-15 DIAGNOSIS — Z1159 Encounter for screening for other viral diseases: Secondary | ICD-10-CM | POA: Diagnosis not present

## 2021-07-15 DIAGNOSIS — M1712 Unilateral primary osteoarthritis, left knee: Secondary | ICD-10-CM | POA: Diagnosis not present

## 2021-07-15 DIAGNOSIS — Z0001 Encounter for general adult medical examination with abnormal findings: Secondary | ICD-10-CM | POA: Diagnosis not present

## 2021-07-15 DIAGNOSIS — E079 Disorder of thyroid, unspecified: Secondary | ICD-10-CM | POA: Diagnosis not present

## 2021-07-15 DIAGNOSIS — Z6833 Body mass index (BMI) 33.0-33.9, adult: Secondary | ICD-10-CM | POA: Diagnosis not present

## 2021-10-07 DIAGNOSIS — Z6833 Body mass index (BMI) 33.0-33.9, adult: Secondary | ICD-10-CM | POA: Diagnosis not present

## 2021-10-07 DIAGNOSIS — I10 Essential (primary) hypertension: Secondary | ICD-10-CM | POA: Diagnosis not present

## 2021-10-07 DIAGNOSIS — E7800 Pure hypercholesterolemia, unspecified: Secondary | ICD-10-CM | POA: Diagnosis not present

## 2021-10-07 DIAGNOSIS — E079 Disorder of thyroid, unspecified: Secondary | ICD-10-CM | POA: Diagnosis not present

## 2021-11-06 DIAGNOSIS — M4712 Other spondylosis with myelopathy, cervical region: Secondary | ICD-10-CM | POA: Diagnosis not present

## 2021-11-06 DIAGNOSIS — M4312 Spondylolisthesis, cervical region: Secondary | ICD-10-CM | POA: Diagnosis not present

## 2021-11-06 DIAGNOSIS — R260 Ataxic gait: Secondary | ICD-10-CM | POA: Diagnosis not present

## 2021-11-06 DIAGNOSIS — R252 Cramp and spasm: Secondary | ICD-10-CM | POA: Diagnosis not present

## 2021-11-06 DIAGNOSIS — R531 Weakness: Secondary | ICD-10-CM | POA: Diagnosis not present

## 2021-11-06 DIAGNOSIS — G959 Disease of spinal cord, unspecified: Secondary | ICD-10-CM | POA: Diagnosis not present

## 2021-11-26 DIAGNOSIS — M4804 Spinal stenosis, thoracic region: Secondary | ICD-10-CM | POA: Diagnosis not present

## 2021-11-26 DIAGNOSIS — M5136 Other intervertebral disc degeneration, lumbar region: Secondary | ICD-10-CM | POA: Diagnosis not present

## 2021-11-26 DIAGNOSIS — G9589 Other specified diseases of spinal cord: Secondary | ICD-10-CM | POA: Diagnosis not present

## 2021-11-26 DIAGNOSIS — Z9889 Other specified postprocedural states: Secondary | ICD-10-CM | POA: Diagnosis not present

## 2021-11-26 DIAGNOSIS — M9971 Connective tissue and disc stenosis of intervertebral foramina of cervical region: Secondary | ICD-10-CM | POA: Diagnosis not present

## 2021-11-26 DIAGNOSIS — M4802 Spinal stenosis, cervical region: Secondary | ICD-10-CM | POA: Diagnosis not present

## 2021-11-26 DIAGNOSIS — G959 Disease of spinal cord, unspecified: Secondary | ICD-10-CM | POA: Diagnosis not present

## 2021-12-26 DIAGNOSIS — G959 Disease of spinal cord, unspecified: Secondary | ICD-10-CM | POA: Diagnosis not present

## 2021-12-31 DIAGNOSIS — M5412 Radiculopathy, cervical region: Secondary | ICD-10-CM | POA: Diagnosis not present

## 2021-12-31 DIAGNOSIS — M5134 Other intervertebral disc degeneration, thoracic region: Secondary | ICD-10-CM | POA: Diagnosis not present

## 2021-12-31 DIAGNOSIS — G959 Disease of spinal cord, unspecified: Secondary | ICD-10-CM | POA: Diagnosis not present

## 2021-12-31 DIAGNOSIS — M542 Cervicalgia: Secondary | ICD-10-CM | POA: Diagnosis not present

## 2021-12-31 DIAGNOSIS — G96198 Other disorders of meninges, not elsewhere classified: Secondary | ICD-10-CM | POA: Diagnosis not present

## 2021-12-31 DIAGNOSIS — M4804 Spinal stenosis, thoracic region: Secondary | ICD-10-CM | POA: Diagnosis not present

## 2021-12-31 DIAGNOSIS — G9589 Other specified diseases of spinal cord: Secondary | ICD-10-CM | POA: Diagnosis not present

## 2021-12-31 DIAGNOSIS — G992 Myelopathy in diseases classified elsewhere: Secondary | ICD-10-CM | POA: Diagnosis not present

## 2021-12-31 DIAGNOSIS — R2681 Unsteadiness on feet: Secondary | ICD-10-CM | POA: Diagnosis not present

## 2021-12-31 DIAGNOSIS — Z9889 Other specified postprocedural states: Secondary | ICD-10-CM | POA: Diagnosis not present

## 2021-12-31 DIAGNOSIS — M2578 Osteophyte, vertebrae: Secondary | ICD-10-CM | POA: Diagnosis not present

## 2021-12-31 DIAGNOSIS — M4802 Spinal stenosis, cervical region: Secondary | ICD-10-CM | POA: Diagnosis not present

## 2021-12-31 DIAGNOSIS — M438X4 Other specified deforming dorsopathies, thoracic region: Secondary | ICD-10-CM | POA: Diagnosis not present

## 2022-01-06 DIAGNOSIS — E039 Hypothyroidism, unspecified: Secondary | ICD-10-CM | POA: Diagnosis not present

## 2022-01-06 DIAGNOSIS — Z6833 Body mass index (BMI) 33.0-33.9, adult: Secondary | ICD-10-CM | POA: Diagnosis not present

## 2022-01-06 DIAGNOSIS — G4733 Obstructive sleep apnea (adult) (pediatric): Secondary | ICD-10-CM | POA: Diagnosis not present

## 2022-01-06 DIAGNOSIS — M4714 Other spondylosis with myelopathy, thoracic region: Secondary | ICD-10-CM | POA: Diagnosis not present

## 2022-01-06 DIAGNOSIS — E669 Obesity, unspecified: Secondary | ICD-10-CM | POA: Diagnosis not present

## 2022-01-06 DIAGNOSIS — I10 Essential (primary) hypertension: Secondary | ICD-10-CM | POA: Diagnosis not present

## 2022-01-06 DIAGNOSIS — Z87891 Personal history of nicotine dependence: Secondary | ICD-10-CM | POA: Diagnosis not present

## 2022-01-09 DIAGNOSIS — G96198 Other disorders of meninges, not elsewhere classified: Secondary | ICD-10-CM | POA: Diagnosis not present

## 2022-01-09 DIAGNOSIS — I10 Essential (primary) hypertension: Secondary | ICD-10-CM | POA: Diagnosis not present

## 2022-01-09 DIAGNOSIS — E669 Obesity, unspecified: Secondary | ICD-10-CM | POA: Diagnosis not present

## 2022-01-09 DIAGNOSIS — Z87891 Personal history of nicotine dependence: Secondary | ICD-10-CM | POA: Diagnosis not present

## 2022-01-09 DIAGNOSIS — G992 Myelopathy in diseases classified elsewhere: Secondary | ICD-10-CM | POA: Diagnosis not present

## 2022-01-09 DIAGNOSIS — Z881 Allergy status to other antibiotic agents status: Secondary | ICD-10-CM | POA: Diagnosis not present

## 2022-01-09 DIAGNOSIS — N529 Male erectile dysfunction, unspecified: Secondary | ICD-10-CM | POA: Diagnosis not present

## 2022-01-09 DIAGNOSIS — E039 Hypothyroidism, unspecified: Secondary | ICD-10-CM | POA: Diagnosis not present

## 2022-01-09 DIAGNOSIS — Z6833 Body mass index (BMI) 33.0-33.9, adult: Secondary | ICD-10-CM | POA: Diagnosis not present

## 2022-01-09 DIAGNOSIS — M4804 Spinal stenosis, thoracic region: Secondary | ICD-10-CM | POA: Diagnosis not present

## 2022-01-09 DIAGNOSIS — M4802 Spinal stenosis, cervical region: Secondary | ICD-10-CM | POA: Diagnosis not present

## 2022-01-09 DIAGNOSIS — G9589 Other specified diseases of spinal cord: Secondary | ICD-10-CM | POA: Diagnosis not present

## 2022-03-11 DIAGNOSIS — M47812 Spondylosis without myelopathy or radiculopathy, cervical region: Secondary | ICD-10-CM | POA: Diagnosis not present

## 2022-03-11 DIAGNOSIS — G959 Disease of spinal cord, unspecified: Secondary | ICD-10-CM | POA: Diagnosis not present

## 2022-03-11 DIAGNOSIS — Z9889 Other specified postprocedural states: Secondary | ICD-10-CM | POA: Diagnosis not present

## 2022-03-11 DIAGNOSIS — M4312 Spondylolisthesis, cervical region: Secondary | ICD-10-CM | POA: Diagnosis not present

## 2022-03-18 DIAGNOSIS — G4733 Obstructive sleep apnea (adult) (pediatric): Secondary | ICD-10-CM | POA: Diagnosis not present

## 2022-03-18 DIAGNOSIS — Z87891 Personal history of nicotine dependence: Secondary | ICD-10-CM | POA: Diagnosis not present

## 2022-03-18 DIAGNOSIS — E039 Hypothyroidism, unspecified: Secondary | ICD-10-CM | POA: Diagnosis not present

## 2022-03-18 DIAGNOSIS — E669 Obesity, unspecified: Secondary | ICD-10-CM | POA: Diagnosis not present

## 2022-03-18 DIAGNOSIS — I10 Essential (primary) hypertension: Secondary | ICD-10-CM | POA: Diagnosis not present

## 2022-03-18 DIAGNOSIS — G959 Disease of spinal cord, unspecified: Secondary | ICD-10-CM | POA: Diagnosis not present

## 2022-03-18 DIAGNOSIS — Z6832 Body mass index (BMI) 32.0-32.9, adult: Secondary | ICD-10-CM | POA: Diagnosis not present

## 2022-04-09 DIAGNOSIS — E7800 Pure hypercholesterolemia, unspecified: Secondary | ICD-10-CM | POA: Diagnosis not present

## 2022-04-09 DIAGNOSIS — I10 Essential (primary) hypertension: Secondary | ICD-10-CM | POA: Diagnosis not present

## 2022-04-09 DIAGNOSIS — E079 Disorder of thyroid, unspecified: Secondary | ICD-10-CM | POA: Diagnosis not present

## 2022-04-13 DIAGNOSIS — E669 Obesity, unspecified: Secondary | ICD-10-CM | POA: Diagnosis not present

## 2022-04-13 DIAGNOSIS — M50023 Cervical disc disorder at C6-C7 level with myelopathy: Secondary | ICD-10-CM | POA: Diagnosis not present

## 2022-04-13 DIAGNOSIS — Z981 Arthrodesis status: Secondary | ICD-10-CM | POA: Diagnosis not present

## 2022-04-13 DIAGNOSIS — N400 Enlarged prostate without lower urinary tract symptoms: Secondary | ICD-10-CM | POA: Diagnosis not present

## 2022-04-13 DIAGNOSIS — I10 Essential (primary) hypertension: Secondary | ICD-10-CM | POA: Diagnosis not present

## 2022-04-13 DIAGNOSIS — Z9889 Other specified postprocedural states: Secondary | ICD-10-CM | POA: Diagnosis not present

## 2022-04-13 DIAGNOSIS — M4712 Other spondylosis with myelopathy, cervical region: Secondary | ICD-10-CM | POA: Diagnosis not present

## 2022-04-13 DIAGNOSIS — Z96653 Presence of artificial knee joint, bilateral: Secondary | ICD-10-CM | POA: Diagnosis not present

## 2022-04-13 DIAGNOSIS — Z6832 Body mass index (BMI) 32.0-32.9, adult: Secondary | ICD-10-CM | POA: Diagnosis not present

## 2022-04-13 DIAGNOSIS — M2578 Osteophyte, vertebrae: Secondary | ICD-10-CM | POA: Diagnosis not present

## 2022-04-13 DIAGNOSIS — Z87891 Personal history of nicotine dependence: Secondary | ICD-10-CM | POA: Diagnosis not present

## 2022-04-13 DIAGNOSIS — M50021 Cervical disc disorder at C4-C5 level with myelopathy: Secondary | ICD-10-CM | POA: Diagnosis not present

## 2022-04-13 DIAGNOSIS — M50022 Cervical disc disorder at C5-C6 level with myelopathy: Secondary | ICD-10-CM | POA: Diagnosis not present

## 2022-04-13 DIAGNOSIS — M4802 Spinal stenosis, cervical region: Secondary | ICD-10-CM | POA: Diagnosis not present

## 2022-04-13 DIAGNOSIS — G992 Myelopathy in diseases classified elsewhere: Secondary | ICD-10-CM | POA: Diagnosis not present

## 2022-04-13 DIAGNOSIS — M5412 Radiculopathy, cervical region: Secondary | ICD-10-CM | POA: Diagnosis not present

## 2022-04-13 DIAGNOSIS — E039 Hypothyroidism, unspecified: Secondary | ICD-10-CM | POA: Diagnosis not present

## 2022-05-14 DIAGNOSIS — M5031 Other cervical disc degeneration,  high cervical region: Secondary | ICD-10-CM | POA: Diagnosis not present

## 2022-05-14 DIAGNOSIS — Z981 Arthrodesis status: Secondary | ICD-10-CM | POA: Diagnosis not present

## 2022-06-04 DIAGNOSIS — R29898 Other symptoms and signs involving the musculoskeletal system: Secondary | ICD-10-CM | POA: Diagnosis not present

## 2022-06-04 DIAGNOSIS — Z981 Arthrodesis status: Secondary | ICD-10-CM | POA: Diagnosis not present

## 2022-06-04 DIAGNOSIS — Z4789 Encounter for other orthopedic aftercare: Secondary | ICD-10-CM | POA: Diagnosis not present

## 2022-06-09 DIAGNOSIS — R29898 Other symptoms and signs involving the musculoskeletal system: Secondary | ICD-10-CM | POA: Diagnosis not present

## 2022-06-09 DIAGNOSIS — Z4789 Encounter for other orthopedic aftercare: Secondary | ICD-10-CM | POA: Diagnosis not present

## 2022-06-09 DIAGNOSIS — Z981 Arthrodesis status: Secondary | ICD-10-CM | POA: Diagnosis not present

## 2022-06-11 DIAGNOSIS — R29898 Other symptoms and signs involving the musculoskeletal system: Secondary | ICD-10-CM | POA: Diagnosis not present

## 2022-06-11 DIAGNOSIS — Z981 Arthrodesis status: Secondary | ICD-10-CM | POA: Diagnosis not present

## 2022-06-11 DIAGNOSIS — Z4789 Encounter for other orthopedic aftercare: Secondary | ICD-10-CM | POA: Diagnosis not present

## 2022-06-15 DIAGNOSIS — Z4789 Encounter for other orthopedic aftercare: Secondary | ICD-10-CM | POA: Diagnosis not present

## 2022-06-15 DIAGNOSIS — Z981 Arthrodesis status: Secondary | ICD-10-CM | POA: Diagnosis not present

## 2022-06-15 DIAGNOSIS — R29898 Other symptoms and signs involving the musculoskeletal system: Secondary | ICD-10-CM | POA: Diagnosis not present

## 2022-06-19 DIAGNOSIS — Z4789 Encounter for other orthopedic aftercare: Secondary | ICD-10-CM | POA: Diagnosis not present

## 2022-06-19 DIAGNOSIS — R29898 Other symptoms and signs involving the musculoskeletal system: Secondary | ICD-10-CM | POA: Diagnosis not present

## 2022-06-19 DIAGNOSIS — Z981 Arthrodesis status: Secondary | ICD-10-CM | POA: Diagnosis not present

## 2022-06-22 DIAGNOSIS — Z4789 Encounter for other orthopedic aftercare: Secondary | ICD-10-CM | POA: Diagnosis not present

## 2022-06-22 DIAGNOSIS — R29898 Other symptoms and signs involving the musculoskeletal system: Secondary | ICD-10-CM | POA: Diagnosis not present

## 2022-06-22 DIAGNOSIS — Z981 Arthrodesis status: Secondary | ICD-10-CM | POA: Diagnosis not present

## 2022-06-25 DIAGNOSIS — Z4789 Encounter for other orthopedic aftercare: Secondary | ICD-10-CM | POA: Diagnosis not present

## 2022-06-25 DIAGNOSIS — Z981 Arthrodesis status: Secondary | ICD-10-CM | POA: Diagnosis not present

## 2022-06-25 DIAGNOSIS — R29898 Other symptoms and signs involving the musculoskeletal system: Secondary | ICD-10-CM | POA: Diagnosis not present

## 2022-06-29 DIAGNOSIS — R29898 Other symptoms and signs involving the musculoskeletal system: Secondary | ICD-10-CM | POA: Diagnosis not present

## 2022-06-29 DIAGNOSIS — M25512 Pain in left shoulder: Secondary | ICD-10-CM | POA: Diagnosis not present

## 2022-06-29 DIAGNOSIS — M47812 Spondylosis without myelopathy or radiculopathy, cervical region: Secondary | ICD-10-CM | POA: Diagnosis not present

## 2022-06-29 DIAGNOSIS — M7582 Other shoulder lesions, left shoulder: Secondary | ICD-10-CM | POA: Diagnosis not present

## 2022-06-29 DIAGNOSIS — M25612 Stiffness of left shoulder, not elsewhere classified: Secondary | ICD-10-CM | POA: Diagnosis not present

## 2022-06-29 DIAGNOSIS — M19012 Primary osteoarthritis, left shoulder: Secondary | ICD-10-CM | POA: Diagnosis not present

## 2022-07-06 ENCOUNTER — Ambulatory Visit: Payer: 59 | Attending: Physician Assistant | Admitting: Physical Therapy

## 2022-07-06 DIAGNOSIS — M79621 Pain in right upper arm: Secondary | ICD-10-CM | POA: Insufficient documentation

## 2022-07-06 DIAGNOSIS — E7800 Pure hypercholesterolemia, unspecified: Secondary | ICD-10-CM | POA: Diagnosis not present

## 2022-07-06 DIAGNOSIS — R262 Difficulty in walking, not elsewhere classified: Secondary | ICD-10-CM | POA: Diagnosis not present

## 2022-07-06 DIAGNOSIS — M6281 Muscle weakness (generalized): Secondary | ICD-10-CM | POA: Diagnosis not present

## 2022-07-06 DIAGNOSIS — R2689 Other abnormalities of gait and mobility: Secondary | ICD-10-CM | POA: Insufficient documentation

## 2022-07-06 DIAGNOSIS — E079 Disorder of thyroid, unspecified: Secondary | ICD-10-CM | POA: Diagnosis not present

## 2022-07-06 DIAGNOSIS — I1A0 Resistant hypertension: Secondary | ICD-10-CM | POA: Diagnosis not present

## 2022-07-06 DIAGNOSIS — Z981 Arthrodesis status: Secondary | ICD-10-CM

## 2022-07-06 DIAGNOSIS — I10 Essential (primary) hypertension: Secondary | ICD-10-CM | POA: Diagnosis not present

## 2022-07-06 NOTE — Therapy (Unsigned)
OUTPATIENT PHYSICAL THERAPY NECK/UPPER QUARTER EVALUATION   Patient Name: Tom Barrett MRN: [id number] DOB:11/21/1960, 62 y.o., male Today's Date: 07/06/2022   PT End of Session - 07/08/22 1533     Visit Number 1    Number of Visits 21    Date for PT Re-Evaluation 09/17/22    Authorization Type [employer] 2024, 30 per year PT/OT/speech    Progress Note Due on Visit 10    PT Start Time 0947    PT Stop Time 1031    PT Time Calculation (min) 44 min    Activity Tolerance Patient tolerated treatment well    Behavior During Therapy WFL for tasks assessed/performed             Past Medical History:  Diagnosis Date   Anemia    Arthritis    Carpal tunnel syndrome, bilateral    Complication of anesthesia    at 62 years old, he woke up being violent   Depression    Headache    eye migraine occasionally   Heberden's nodes    History of kidney stones    feels like he has passed a few stones   Hyperlipemia    Hypertension    Hypokalemia    Hypothyroidism    Malaise and fatigue    Sleep apnea    does not tolerate CPAP   Past Surgical History:  Procedure Laterality Date   CARPAL TUNNEL RELEASE Right 06/13/2020   Procedure: CARPAL TUNNEL RELEASE ENDOSCOPIC;  Surgeon: Christena Flake, MD;  Location: [facility];  Service: Orthopedics;  Laterality: Right;   CARPAL TUNNEL RELEASE Left 10/09/2020   Procedure: LEFT CARPAL TUNNEL RELEASE ENDOSCOPIC WITH SUBCUTANEOUS ANTERIOR TRANSPOSITION OF ULNER NERVE AT LEFT ELBOW.;  Surgeon: Christena Flake, MD;  Location: [facility];  Service: Orthopedics;  Laterality: Left;   CIRCUMCISION     as a teenager    COLONOSCOPY WITH PROPOFOL N/A 10/29/2014   Procedure: COLONOSCOPY WITH PROPOFOL;  Surgeon: Elnita Maxwell, MD;  Location: [facility];  Service: Endoscopy;  Laterality: N/A;   POSTERIOR CERVICAL LAMINECTOMY  12/23/2020   Procedure: POSTERIOR CERVICAL LAMINECTOMY C3-6;  Surgeon: Lucy Chris, MD;  Location: [facility];  Service: Neurosurgery;;    TOTAL KNEE ARTHROPLASTY Right 11/09/2017   Procedure: TOTAL KNEE ARTHROPLASTY;  Surgeon: Christena Flake, MD;  Location: [facility];  Service: Orthopedics;  Laterality: Right;   TOTAL KNEE ARTHROPLASTY Left 10/10/2019   Procedure: TOTAL KNEE ARTHROPLASTY;  Surgeon: Christena Flake, MD;  Location: [facility];  Service: Orthopedics;  Laterality: Left;   Patient Active Problem List   Diagnosis Date Noted   Cervical stenosis of spinal canal 12/23/2020   Primary osteoarthritis of left knee 04/01/2018   Status post total knee replacement using cement, right 11/09/2017    PCP: Olena Leatherwood, FNP  REFERRING PROVIDER: Douglass Rivers, PA*  REFERRING DIAGNOSIS: Z98.1 (ICD-10-CM) - S/P cervical spinal fusion   THERAPY DIAG: Pain in right upper arm  Muscle weakness (generalized)  S/P cervical spinal fusion  RATIONALE FOR EVALUATION AND TREATMENT: Rehabilitation  ONSET DATE: C4-7 04/13/22   FOLLOW UP APPT WITH PROVIDER: Yes , f/u with Dr. Neal Dy this Wednesday   SUBJECTIVE:  Chief Complaint: Pt is a 62 year old male s/p C4-7 ACDF 04/13/22; Hx of T6-8 aminoplasty in November 2023.   Pertinent History Pt is a 62 year old male s/p C4-7 ACDF 04/13/22; Hx of previous posterior cervical spine laminectomy C3-6; Hx of T6-8 aminoplasty in November 2023. 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 He reports immediate improvement in his R arm. He reports notable weakness with R arm; he reports ruling out RTC injury. Pt feels that home health therapy did help. Patient reports minimal strength in L hand. Pt reports some residual numbness affecting L>RUE. Patient reports history of forearm pain along extensors in each forearm that has improved. Pt reports not having major pain at arrival. He reports sensation of fullness in his hands.  He reports mainly having numbness and weakness. Pt denies hypersensitivity or pain with light touch along L arm region. Pt using gabapentin for symptom management.   Pain:  Pain Intensity: Present: 0/10, Best: 0/10, Worst: 6/10 Pain location: L upper arm along distal 1/3 laterally Pain Quality:  numbness, sharp pain along L arm    Radiating: No, symptoms are localized along R arm  Numbness/Tingling: Yes,sensory loss, no paresthesias Focal Weakness: Yes, L shoulder L>R hand  Aggravating factors: neck stretches given from hospital Relieving factors: nothing seems to help arms, Gabapentin 24-hour pain behavior: AM is worst  History of prior neck injury, pain, surgery, or therapy: Yes; complex surgical history (see above)  Falls: Has patient fallen in last 6 months? No, Number of falls: N/A Follow-up appointment with MD: Yes, this Wednesday 07/08/22 Dominant hand: right Imaging: Yes ;  Post-op radiographs negative for any acute abnormalities/hardware issues   Prior level of function: Independent with community mobility with device; pt uses walking stick outside; IND with self-care/hygiene  Occupational demands: Pt out of work from previous job  Hobbies: fishing, able to play with grandchildren (4 y/o, 2y/o, 1 y/o)  Red flags (personal history of cancer, h/o spinal tumors, history of compression fracture, chills/fever, night sweats, nausea, vomiting, unrelenting pain): Negative  Precautions: Other: 20-lb lifting restriction  Weight Bearing Restrictions: No  Living Environment Lives with: lives with their spouse; wife is able to help patient at home. Pt is alone during the day when wife is at work. Grab bars in bathroom. Built-in bench. Pt has non-skid mat. 6 stairs to get in home with rails on both side. Flat terrain to get into home - sidewalk up to his front door  Lives in: House/apartment   Patient Goals:  More use of his hands and arms; would like to walk better    OBJECTIVE:    Patient Surveys  FOTO 45, predicted outcome score of 55  Cognition Patient is oriented to person, place, and time.  Recent memory is intact.  Remote memory is intact.  Attention span and concentration are intact.  Expressive speech is intact.  Patient's fund of knowledge is within normal limits for educational level.    Gross Musculoskeletal Assessment Tremor: None Bulk: Normal Tone: Normal   Gait Abductor lurch toward his L side, decreased TKE at terminal swing, dec arm swing and stiff trunk. Overpronation of bilat feet.   Posture Guarded posture, rounded shoulders    AROM AROM (Normal range in degrees) AROM 07/08/2022  Cervical  Flexion (50) 25  Extension (80) 30  Right lateral flexion (45) 28  Left lateral flexion (45) 23  Right rotation (85) 42  Left rotation (85) 33  (* = pain; Blank rows = not tested)   Shoulder AROM Shoulder  flexion: R 144, L 30 Shoulder abduction: R 131, L 38  Shoulder ER: R WNL, L 47    MMT MMT (out of 5) Right 07/08/2022 Left 07/08/2022  Cervical (isometric)  Flexion WNL  Extension WNL  Lateral Flexion WNL WNL  Rotation WNL WNL      Shoulder   Flexion 4 2-  Extension    Abduction 4 2-  Internal rotation 4 4  External rotation  4 3+  Horizontal abduction    Horizontal adduction    Lower Trapezius    Rhomboids        Elbow  Flexion 4 3+  Extension 4 4  Pronation    Supination        (* = pain; Blank rows = not tested)   LE MMT Hip flexion: R 4+, L 4- Quad: R 5-, L 4+ Ankle DF: R 4+, L 4+ Hip ABD: R 4, L 4    Sensation C7 and C8 loss of light touch LUE. Proprioception and hot/cold testing deferred on this date.  Reflexes R/L Elbow: 2+/2+  Brachioradialis: 2+/2+  Tricep: 2+/2+  Palpation No TTP along L arm/forearm    SPECIAL TESTS Hoffman Sign (cervical cord compression): R: Negative L: Negative ULTT Median: R: Not examined L: Not examined ULTT Ulnar: R: Not examined L: Not examined ULTT Radial: R:  Not examined L: Not examined     TODAY'S TREATMENT     Therapeutic Exercise - discussion on appropriate exercise/activity modification, PT education  Patient education on current condition, role of PT, prognosis, plan of care. Discussion on use of appropriate AD for community-level mobility to prevent fall.     PATIENT EDUCATION:  Education details: Plan of care Person educated: Patient Education method: Explanation Education comprehension: verbalized understanding   HOME EXERCISE PROGRAM: HEP to be established next visit    ASSESSMENT:  CLINICAL IMPRESSION: Patient is a 62 y.o. male s/p C4-7 ACDF on 04/13/22 by Dr. Neal Dy; T6-8 laminoplasty in November 2023. Hx of previous posterior cervical spine laminectomy C3-6. Referring provider is concerned for possible C5 nerve palsy. He denies notable pain, but he has numbness in L>R hand/digits. Pt has primary complaints of L>R upper limb weakness and decreased functional use, imbalance, and gait deficits. Objective impairments include Abnormal gait, decreased activity tolerance, decreased balance, difficulty walking, decreased strength, impaired UE functional use, and paresthesias/sensory loss in L>RUE . These impairments are limiting patient from meal prep, cleaning, driving, community activity, and fishing . Personal factors including Past/current experiences (complicated orthopedic history), Time since onset of injury/illness/exacerbation, and 3+ comorbidities: (anemia, HTN, HLD, hypokalemia, hypothyroidism, depression, OA) are also affecting patient's functional outcome. Patient will benefit from skilled PT to address above impairments and improve overall function.  REHAB POTENTIAL: Fair given complicated musculoskeletal history with multiple spinal surgeries, bilateral upper limb involvement  CLINICAL DECISION MAKING: Unstable/unpredictable  EVALUATION COMPLEXITY: High   GOALS: Goals reviewed with patient? Yes  SHORT TERM  GOALS: Target date: 07/29/2022  Pt will be independent with HEP to improve strength and decrease neck pain to improve pain-free function at home and work. Baseline: 07/06/22: HEP to be initiated and reviewed next visit.  Goal status: INITIAL   LONG TERM GOALS: Target date: 09/17/2022  Pt will increase FOTO to at least 53 to demonstrate significant improvement in function at home and work related to neck pain  Baseline: 07/06/22: 45 Goal status: INITIAL  2.  Patient will have active R shoulder flexion and abduction to 130 deg or greater as  needed for functional reaching, self-care activities, household work Baseline: 07/06/22: R shoulder AROM flexion 30, ABD 38.   Goal status: INITIAL  3.  Pt will have grip strength (tested by dynamometer) within standard error of age-based/gender-based norm indicative of improved ability to perform gripping/prehensile activities with each upper limb  Baseline: 07/06/22: Grip strength baseline to be formally tested next visit.  Goal status: INITIAL  4.  Pt will score DGI > 19 indicative of no increased risk of falling  Baseline: 07/06/22: Baseline to be obtained on visit # 2.  Goal status: INITIAL  5.  Pt will improve bilateral upper limb MMTs to 4+/5 indicative of improved strength as needed for moving items, carrying, lifting within post-op restrictions Baseline: 07/06/22: RUE strength grossly 4/5, LUE 2- to 4 Goal status: INITIAL   PLAN: PT FREQUENCY: 2x/week  PT DURATION: 10 weeks  PLANNED INTERVENTIONS: Therapeutic exercises, Therapeutic activity, Neuromuscular re-education, Balance training, Gait training, Patient/Family education, Dry Needling, Electrical stimulation, Cryotherapy, Moist heat, Taping, Ultrasound, and Manual therapy  PLAN FOR NEXT SESSION: Continue with assessment of balance (mCTSIB, Romberg, DGI), grip strength testing. Continue with work on arm ROM and consider use of e-stim/NMES for improved muscle activation. Work on postural control  and weightshifting drills as well as HEP for LE strengthening.    Consuela Mimes, PT, DPT #[id number]  Gertie Exon 07/08/2022, 3:36 PM

## 2022-07-08 ENCOUNTER — Encounter: Payer: Self-pay | Admitting: Physical Therapy

## 2022-07-08 ENCOUNTER — Encounter: Payer: 59 | Admitting: Physical Therapy

## 2022-07-08 DIAGNOSIS — Z981 Arthrodesis status: Secondary | ICD-10-CM | POA: Diagnosis not present

## 2022-07-08 DIAGNOSIS — R29898 Other symptoms and signs involving the musculoskeletal system: Secondary | ICD-10-CM | POA: Diagnosis not present

## 2022-07-08 DIAGNOSIS — M7989 Other specified soft tissue disorders: Secondary | ICD-10-CM | POA: Diagnosis not present

## 2022-07-08 DIAGNOSIS — M79602 Pain in left arm: Secondary | ICD-10-CM | POA: Diagnosis not present

## 2022-07-08 DIAGNOSIS — G588 Other specified mononeuropathies: Secondary | ICD-10-CM | POA: Diagnosis not present

## 2022-07-08 DIAGNOSIS — Z9889 Other specified postprocedural states: Secondary | ICD-10-CM | POA: Diagnosis not present

## 2022-07-09 ENCOUNTER — Ambulatory Visit: Payer: 59 | Admitting: Physical Therapy

## 2022-07-09 DIAGNOSIS — R262 Difficulty in walking, not elsewhere classified: Secondary | ICD-10-CM

## 2022-07-09 DIAGNOSIS — M79621 Pain in right upper arm: Secondary | ICD-10-CM

## 2022-07-09 DIAGNOSIS — M6281 Muscle weakness (generalized): Secondary | ICD-10-CM

## 2022-07-09 DIAGNOSIS — R2689 Other abnormalities of gait and mobility: Secondary | ICD-10-CM

## 2022-07-09 DIAGNOSIS — Z981 Arthrodesis status: Secondary | ICD-10-CM | POA: Diagnosis not present

## 2022-07-09 NOTE — Therapy (Signed)
OUTPATIENT PHYSICAL THERAPY TREATMENT NOTE   Patient Name: Tom Barrett MRN: [id number] DOB:February 06, 1961, 62 y.o., male Today's Date: 07/09/2022  PCP: Olena Leatherwood, FNP  REFERRING PROVIDER: Douglass Rivers, PA*   END OF SESSION:   PT End of Session - 07/13/22 0553     Visit Number 2    Number of Visits 21    Date for PT Re-Evaluation 09/17/22    Authorization Type [employer] 2024, 30 per year PT/OT/speech    Progress Note Due on Visit 10    PT Start Time 1645    PT Stop Time 1730    PT Time Calculation (min) 45 min    Activity Tolerance Patient tolerated treatment well    Behavior During Therapy WFL for tasks assessed/performed             Past Medical History:  Diagnosis Date   Anemia    Arthritis    Carpal tunnel syndrome, bilateral    Complication of anesthesia    at 62 years old, he woke up being violent   Depression    Headache    eye migraine occasionally   Heberden's nodes    History of kidney stones    feels like he has passed a few stones   Hyperlipemia    Hypertension    Hypokalemia    Hypothyroidism    Malaise and fatigue    Sleep apnea    does not tolerate CPAP   Past Surgical History:  Procedure Laterality Date   CARPAL TUNNEL RELEASE Right 06/13/2020   Procedure: CARPAL TUNNEL RELEASE ENDOSCOPIC;  Surgeon: Christena Flake, MD;  Location: [facility];  Service: Orthopedics;  Laterality: Right;   CARPAL TUNNEL RELEASE Left 10/09/2020   Procedure: LEFT CARPAL TUNNEL RELEASE ENDOSCOPIC WITH SUBCUTANEOUS ANTERIOR TRANSPOSITION OF ULNER NERVE AT LEFT ELBOW.;  Surgeon: Christena Flake, MD;  Location: [facility];  Service: Orthopedics;  Laterality: Left;   CIRCUMCISION     as a teenager    COLONOSCOPY WITH PROPOFOL N/A 10/29/2014   Procedure: COLONOSCOPY WITH PROPOFOL;  Surgeon: Elnita Maxwell, MD;  Location: [facility] END[facility];  Service: Endoscopy;  Laterality: N/A;   POSTERIOR CERVICAL LAMINECTOMY  12/23/2020   Procedure: POSTERIOR CERVICAL LAMINECTOMY  C3-6;  Surgeon: Lucy Chris, MD;  Location: [facility];  Service: Neurosurgery;;   TOTAL KNEE ARTHROPLASTY Right 11/09/2017   Procedure: TOTAL KNEE ARTHROPLASTY;  Surgeon: Christena Flake, MD;  Location: [facility];  Service: Orthopedics;  Laterality: Right;   TOTAL KNEE ARTHROPLASTY Left 10/10/2019   Procedure: TOTAL KNEE ARTHROPLASTY;  Surgeon: Christena Flake, MD;  Location: [facility];  Service: Orthopedics;  Laterality: Left;   Patient Active Problem List   Diagnosis Date Noted   Cervical stenosis of spinal canal 12/23/2020   Primary osteoarthritis of left knee 04/01/2018   Status post total knee replacement using cement, right 11/09/2017    REFERRING DIAG:  Z98.1 (ICD-10-CM) - S/P cervical spinal fusion   THERAPY DIAG:  Pain in right upper arm  Muscle weakness (generalized)  Difficulty in walking, not elsewhere classified  Imbalance  S/P cervical spinal fusion  Rationale for Evaluation and Treatment Rehabilitation  PERTINENT HISTORY: Pt is a 62 year old male s/p C4-7 ACDF 04/13/22; Hx of previous posterior cervical spine laminectomy C3-6; Hx of T6-8 aminoplasty in November 2023. 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 He reports immediate improvement in his R arm. He reports notable weakness with R arm; he reports ruling out RTC injury. Pt feels that home health  therapy did help. Patient reports minimal strength in L hand. Pt reports some residual numbness affecting L>RUE. Patient reports history of forearm pain along extensors in each forearm that has improved. Pt reports not having major pain at arrival. He reports sensation of fullness in his hands. He reports mainly having numbness and weakness. Pt denies hypersensitivity or pain with light touch along L arm region. Pt using gabapentin for symptom management.    Pain:  Pain Intensity: Present: 0/10, Best: 0/10, Worst: 6/10 Pain location: L upper arm along distal 1/3 laterally Pain Quality:  numbness, sharp pain along L arm     Radiating: No, symptoms are localized along R arm  Numbness/Tingling: Yes,sensory loss, no paresthesias Focal Weakness: Yes, L shoulder L>R hand  Aggravating factors: neck stretches given from hospital Relieving factors: nothing seems to help arms, Gabapentin 24-hour pain behavior: AM is worst  History of prior neck injury, pain, surgery, or therapy: Yes; complex surgical history (see above)  Falls: Has patient fallen in last 6 months? No, Number of falls: N/A Follow-up appointment with MD: Yes, this Wednesday 07/08/22 Dominant hand: right Imaging: Yes ;   Post-op radiographs negative for any acute abnormalities/hardware issues     Prior level of function: Independent with community mobility with device; pt uses walking stick outside; IND with self-care/hygiene  Occupational demands: Pt out of work from previous job  Hobbies: fishing, able to play with grandchildren (4 y/o, 2y/o, 1 y/o)   Red flags (personal history of cancer, h/o spinal tumors, history of compression fracture, chills/fever, night sweats, nausea, vomiting, unrelenting pain): Negative   Weight Bearing Restrictions: No   Living Environment Lives with: lives with their spouse; wife is able to help patient at home. Pt is alone during the day when wife is at work. Grab bars in bathroom. Built-in bench. Pt has non-skid mat. 6 stairs to get in home with rails on both side. Flat terrain to get into home - sidewalk up to his front door  Lives in: House/apartment     Patient Goals:  More use of his hands and arms; would like to walk better       PRECAUTIONS: No lifting > 20 lbs, impaired gait/fall risk  SUBJECTIVE:                                                                                                                                                                                      SUBJECTIVE STATEMENT:  Patient reports no further updates today and no new complaints this afternoon. He had X-rays with surgeon  yesterday and had good post-op radiograph results. Pt reports feeling very "tight and numb" in his arms -  he states his walking is worse at this time of day.    PAIN:  Are you having pain? Yes: NPRS scale: 4/10, tight and numb  Pain location: Upper arms, hands    OBJECTIVE: (objective measures completed at initial evaluation unless otherwise dated)   Patient Surveys  FOTO 45, predicted outcome score of 56   Cognition Patient is oriented to person, place, and time.  Recent memory is intact.  Remote memory is intact.  Attention span and concentration are intact.  Expressive speech is intact.  Patient's fund of knowledge is within normal limits for educational level.                          Gross Musculoskeletal Assessment Tremor: None Bulk: Normal Tone: Normal     Gait Abductor lurch toward his L side, decreased TKE at terminal swing, dec arm swing and stiff trunk. Overpronation of bilat feet.    Posture Guarded posture, rounded shoulders      AROM AROM (Normal range in degrees) AROM 07/08/2022  Cervical  Flexion (50) 25  Extension (80) 30  Right lateral flexion (45) 28  Left lateral flexion (45) 23  Right rotation (85) 42  Left rotation (85) 33  (* = pain; Blank rows = not tested)     Shoulder AROM Shoulder flexion: R 144, L 30 Shoulder abduction: R 131, L 38  Shoulder ER: R WNL, L 47      MMT MMT (out of 5) Right 07/08/2022 Left 07/08/2022  Cervical (isometric)  Flexion WNL  Extension WNL  Lateral Flexion WNL WNL  Rotation WNL WNL         Shoulder   Flexion 4 2-  Extension      Abduction 4 2-  Internal rotation 4 4  External rotation  4 3+  Horizontal abduction      Horizontal adduction      Lower Trapezius      Rhomboids             Elbow  Flexion 4 3+  Extension 4 4  Pronation      Supination             (* = pain; Blank rows = not tested)     LE MMT Hip flexion: R 4+, L 4- Quad: R 5-, L 4+ Ankle DF: R 4+, L 4+ Hip ABD: R 4, L 4      Grip strength dynamometry (07/09/22) Best of 3 trials R: 71.3,  L: 41.6     Sensation C7 and C8 loss of light touch LUE. Proprioception and hot/cold testing deferred on this date.   Reflexes R/L Elbow: 2+/2+  Brachioradialis: 2+/2+  Tricep: 2+/2+   Palpation No TTP along L arm/forearm     SPECIAL TESTS Hoffman Sign (cervical cord compression): R: Negative L: Negative ULTT Median: R: Not examined L: Not examined ULTT Ulnar: R: Not examined L: Not examined ULTT Radial: R: Not examined L: Not examined      Clinical Test of Sensory Interaction for Balance    (CTSIB): Performed 07/09/22 CONDITION TIME STRATEGY SWAY  Eyes open, firm surface 30 seconds ankle   Eyes closed, firm surface 30 seconds ankle   Eyes open, foam surface 30 seconds ankle Increased  Eyes closed, foam surface 30 seconds Ankle, hip           Increased     Romberg: EO WNL, EC WNL (with increased sway)  DGI: To  be performed next visti      TODAY'S TREATMENT      Manual Therapy - for symptom modulation, soft tissue sensitivity and mobility, joint mobility, ROM   L shoulder PROM within pt tolerance x 5 minutes R shoulder PROM within pt tolerance x 1 minute GHJ mobilization L shoulder; inferior glide gr III 2 x 30 sec, posterior glide gr III 2 x 30 sec - for joint mobility      Therapeutic Exercise - for improved soft tissue flexibility and extensibility as needed for ROM, improved strength as needed to improve performance of CKC activities/functional movements  NuStep; Level 3; 5 minutes  -arms at 8, seat at 11  -LUE AAROM and BLE aerobic work  Sit to stand, hands on anterior thighs for assist; 1x10 - for HEP review   Grip strength testing (see above)   Wand shoulder flexion AAROM; 2x10  -difficulty with eccentric control Wand shoulder ER AAROM; x20 Putty gripping; x 1 minute, Red putty Pulleys - shoulder flexion AAROM - reviewed for HEP  PATIENT EDUCATION: Discussed results of  testing today and goals of PT moving forward   Neuromuscular Re-education - for improved sensory integration, static and dynamic postural control, equilibrium and non-equilibrium coordination as needed for negotiating home and community environment and stepping over obstacles  Perform mCTSIB, Romberg (see above)   *next visit* DGI      PATIENT EDUCATION:  Education details: Plan of care Person educated: Patient Education method: Explanation Education comprehension: verbalized understanding     HOME EXERCISE PROGRAM: Access Code: [email or web address] URL: [email or web address] Date: 07/13/2022 Prepared by: Consuela Mimes  Exercises - Sit to Stand  - 2 x daily - 7 x weekly - 2 sets - 10 reps - Putty Squeezes  - 2 x daily - 7 x weekly - 1-2 sets - 2-3 min hold - Supine Shoulder Flexion with Dowel  - 2 x daily - 7 x weekly - 2 sets - 10 reps - Supine Shoulder External Rotation in 45 Degrees Abduction AAROM with Dowel  - 2 x daily - 7 x weekly - 2 sets - 10 reps - Seated Shoulder Flexion AAROM with Pulley Behind  - 2 x daily - 7 x weekly - 2-3 mins hold     ASSESSMENT:   CLINICAL IMPRESSION: Patient completed additional performance-based testing today to further assess function of bilateral upper limbs and postural control. DGI will need to be assessed next visit. Goal for grip strength was updated for baseline measure obtained today. Pt was provided with exercise putty and pulleys to work on grip strengthening and upper limb AAROM, respectively. Patient does have stiffness in L GHJ likely resulting from prolonged limited AROM of L arm. Pt does have apparent motor weakness of L>R upper limb. Pt will need continued work on restoring grip, upper limb AROM, postural control, and gait stability. Patient will benefit from skilled PT to address above impairments and improve overall function.   REHAB POTENTIAL: Fair given complicated musculoskeletal history with multiple spinal  surgeries, bilateral upper limb involvement   CLINICAL DECISION MAKING: Unstable/unpredictable   EVALUATION COMPLEXITY: High     GOALS: Goals reviewed with patient? Yes   SHORT TERM GOALS: Target date: 07/29/2022   Pt will be independent with HEP to improve strength and decrease neck pain to improve pain-free function at home and work. Baseline: 07/06/22: HEP to be initiated and reviewed next visit.  Goal status: INITIAL     LONG TERM GOALS: Target date: 09/17/2022  Pt will increase FOTO to at least 53 to demonstrate significant improvement in function at home and work related to neck pain  Baseline: 07/06/22: 45 Goal status: INITIAL   2.  Patient will have active R shoulder flexion and abduction to 130 deg or greater as needed for functional reaching, self-care activities, household work Baseline: 07/06/22: R shoulder AROM flexion 30, ABD 38.   Goal status: INITIAL   3.  Pt will have grip strength (tested by dynamometer) within standard error of age-based/gender-based norm (R hand 88.2 lbs, L hand 83.78 lbs) indicative of improved ability to perform gripping/prehensile activities with each upper limb  Baseline: 07/06/22: Grip strength baseline to be formally tested next visit.   07/09/22: R: 71.3,  L: 41.6  Goal status: INITIAL   4.  Pt will score DGI > 19 indicative of no increased risk of falling  Baseline: 07/06/22: Baseline to be obtained on visit # 3.  Goal status: INITIAL   5.  Pt will improve bilateral upper limb MMTs to 4+/5 indicative of improved strength as needed for moving items, carrying, lifting within post-op restrictions Baseline: 07/06/22: RUE strength grossly 4/5, LUE 2- to 4 Goal status: INITIAL     PLAN: PT FREQUENCY: 2x/week   PT DURATION: 10 weeks   PLANNED INTERVENTIONS: Therapeutic exercises, Therapeutic activity, Neuromuscular re-education, Balance training, Gait training, Patient/Family education, Dry Needling, Electrical stimulation, Cryotherapy, Moist heat,  Taping, Ultrasound, and Manual therapy   PLAN FOR NEXT SESSION: Continue with work on shoulder/UE ROM and consider use of e-stim/NMES for improved muscle activation. Work on postural control and weightshifting drills as well as HEP for LE strengthening.       Consuela Mimes, PT, DPT [phone] 07/13/2022, 5:53 AM

## 2022-07-13 ENCOUNTER — Ambulatory Visit: Payer: 59 | Admitting: Physical Therapy

## 2022-07-13 ENCOUNTER — Encounter: Payer: Self-pay | Admitting: Physical Therapy

## 2022-07-13 DIAGNOSIS — M79621 Pain in right upper arm: Secondary | ICD-10-CM | POA: Diagnosis not present

## 2022-07-13 DIAGNOSIS — R262 Difficulty in walking, not elsewhere classified: Secondary | ICD-10-CM

## 2022-07-13 DIAGNOSIS — M6281 Muscle weakness (generalized): Secondary | ICD-10-CM | POA: Diagnosis not present

## 2022-07-13 DIAGNOSIS — Z981 Arthrodesis status: Secondary | ICD-10-CM

## 2022-07-13 DIAGNOSIS — R2689 Other abnormalities of gait and mobility: Secondary | ICD-10-CM

## 2022-07-13 NOTE — Therapy (Signed)
OUTPATIENT PHYSICAL THERAPY TREATMENT NOTE   Patient Name: Tom Barrett MRN: [id number] DOB:1960/12/04, 62 y.o., male Today's Date: 07/13/2022  PCP: Olena Leatherwood, FNP  REFERRING PROVIDER: Douglass Rivers, PA*   END OF SESSION:   PT End of Session - 07/13/22 0824     Visit Number 3    Number of Visits 21    Date for PT Re-Evaluation 09/17/22    Authorization Type [employer] 2024, 30 per year PT/OT/speech    Progress Note Due on Visit 10    PT Start Time 0820    PT Stop Time 0901    PT Time Calculation (min) 41 min    Activity Tolerance Patient tolerated treatment well    Behavior During Therapy WFL for tasks assessed/performed              Past Medical History:  Diagnosis Date   Anemia    Arthritis    Carpal tunnel syndrome, bilateral    Complication of anesthesia    at 62 years old, he woke up being violent   Depression    Headache    eye migraine occasionally   Heberden's nodes    History of kidney stones    feels like he has passed a few stones   Hyperlipemia    Hypertension    Hypokalemia    Hypothyroidism    Malaise and fatigue    Sleep apnea    does not tolerate CPAP   Past Surgical History:  Procedure Laterality Date   CARPAL TUNNEL RELEASE Right 06/13/2020   Procedure: CARPAL TUNNEL RELEASE ENDOSCOPIC;  Surgeon: Christena Flake, MD;  Location: [facility];  Service: Orthopedics;  Laterality: Right;   CARPAL TUNNEL RELEASE Left 10/09/2020   Procedure: LEFT CARPAL TUNNEL RELEASE ENDOSCOPIC WITH SUBCUTANEOUS ANTERIOR TRANSPOSITION OF ULNER NERVE AT LEFT ELBOW.;  Surgeon: Christena Flake, MD;  Location: [facility];  Service: Orthopedics;  Laterality: Left;   CIRCUMCISION     as a teenager    COLONOSCOPY WITH PROPOFOL N/A 10/29/2014   Procedure: COLONOSCOPY WITH PROPOFOL;  Surgeon: Elnita Maxwell, MD;  Location: [facility];  Service: Endoscopy;  Laterality: N/A;   POSTERIOR CERVICAL LAMINECTOMY  12/23/2020   Procedure: POSTERIOR CERVICAL LAMINECTOMY  C3-6;  Surgeon: Lucy Chris, MD;  Location: [facility];  Service: Neurosurgery;;   TOTAL KNEE ARTHROPLASTY Right 11/09/2017   Procedure: TOTAL KNEE ARTHROPLASTY;  Surgeon: Christena Flake, MD;  Location: [facility];  Service: Orthopedics;  Laterality: Right;   TOTAL KNEE ARTHROPLASTY Left 10/10/2019   Procedure: TOTAL KNEE ARTHROPLASTY;  Surgeon: Christena Flake, MD;  Location: [facility];  Service: Orthopedics;  Laterality: Left;   Patient Active Problem List   Diagnosis Date Noted   Cervical stenosis of spinal canal 12/23/2020   Primary osteoarthritis of left knee 04/01/2018   Status post total knee replacement using cement, right 11/09/2017    REFERRING DIAG:  Z98.1 (ICD-10-CM) - S/P cervical spinal fusion   THERAPY DIAG:  Pain in right upper arm  Muscle weakness (generalized)  Difficulty in walking, not elsewhere classified  Imbalance  S/P cervical spinal fusion  Rationale for Evaluation and Treatment Rehabilitation  PERTINENT HISTORY: Pt is a 62 year old male s/p C4-7 ACDF 04/13/22; Hx of previous posterior cervical spine laminectomy C3-6; Hx of T6-8 aminoplasty in November 2023. 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 He reports immediate improvement in his R arm. He reports notable weakness with R arm; he reports ruling out RTC injury. Pt feels that home  health therapy did help. Patient reports minimal strength in L hand. Pt reports some residual numbness affecting L>RUE. Patient reports history of forearm pain along extensors in each forearm that has improved. Pt reports not having major pain at arrival. He reports sensation of fullness in his hands. He reports mainly having numbness and weakness. Pt denies hypersensitivity or pain with light touch along L arm region. Pt using gabapentin for symptom management.    Pain:  Pain Intensity: Present: 0/10, Best: 0/10, Worst: 6/10 Pain location: L upper arm along distal 1/3 laterally Pain Quality:  numbness, sharp pain along L arm     Radiating: No, symptoms are localized along R arm  Numbness/Tingling: Yes,sensory loss, no paresthesias Focal Weakness: Yes, L shoulder L>R hand  Aggravating factors: neck stretches given from hospital Relieving factors: nothing seems to help arms, Gabapentin 24-hour pain behavior: AM is worst  History of prior neck injury, pain, surgery, or therapy: Yes; complex surgical history (see above)  Falls: Has patient fallen in last 6 months? No, Number of falls: N/A Follow-up appointment with MD: Yes, this Wednesday 07/08/22 Dominant hand: right Imaging: Yes ;   Post-op radiographs negative for any acute abnormalities/hardware issues     Prior level of function: Independent with community mobility with device; pt uses walking stick outside; IND with self-care/hygiene  Occupational demands: Pt out of work from previous job  Hobbies: fishing, able to play with grandchildren (4 y/o, 2y/o, 1 y/o)   Red flags (personal history of cancer, h/o spinal tumors, history of compression fracture, chills/fever, night sweats, nausea, vomiting, unrelenting pain): Negative   Weight Bearing Restrictions: No   Living Environment Lives with: lives with their spouse; wife is able to help patient at home. Pt is alone during the day when wife is at work. Grab bars in bathroom. Built-in bench. Pt has non-skid mat. 6 stairs to get in home with rails on both side. Flat terrain to get into home - sidewalk up to his front door  Lives in: House/apartment     Patient Goals:  More use of his hands and arms; would like to walk better       PRECAUTIONS: No lifting > 20 lbs, impaired gait/fall risk  SUBJECTIVE:                                                                                                                                                                                      SUBJECTIVE STATEMENT:  Patient reports change in thyroid medication and he is working on lowering dosage of Gabapentin - his  physician instructed him on slowly lowering once he is able. He reports having some back pain  last night and having more this AM. He reports paresthesias and sense of fullness in his hands when working with upper body; this has been chronic.    PAIN:  Are you having pain? No upper quarter pain. He reports numbness affecting arms/hands.  Pain location: Upper arms, hands    OBJECTIVE: (objective measures completed at initial evaluation unless otherwise dated)   Patient Surveys  FOTO 45, predicted outcome score of 40   Cognition Patient is oriented to person, place, and time.  Recent memory is intact.  Remote memory is intact.  Attention span and concentration are intact.  Expressive speech is intact.  Patient's fund of knowledge is within normal limits for educational level.                          Gross Musculoskeletal Assessment Tremor: None Bulk: Normal Tone: Normal     Gait Abductor lurch toward his L side, decreased TKE at terminal swing, dec arm swing and stiff trunk. Overpronation of bilat feet.    Posture Guarded posture, rounded shoulders      AROM AROM (Normal range in degrees) AROM 07/08/2022  Cervical  Flexion (50) 25  Extension (80) 30  Right lateral flexion (45) 28  Left lateral flexion (45) 23  Right rotation (85) 42  Left rotation (85) 33  (* = pain; Blank rows = not tested)     Shoulder AROM Shoulder flexion: R 144, L 30 Shoulder abduction: R 131, L 38  Shoulder ER: R WNL, L 47      MMT MMT (out of 5) Right 07/08/2022 Left 07/08/2022  Cervical (isometric)  Flexion WNL  Extension WNL  Lateral Flexion WNL WNL  Rotation WNL WNL         Shoulder   Flexion 4 2-  Extension      Abduction 4 2-  Internal rotation 4 4  External rotation  4 3+  Horizontal abduction      Horizontal adduction      Lower Trapezius      Rhomboids             Elbow  Flexion 4 3+  Extension 4 4  Pronation      Supination             (* = pain; Blank rows =  not tested)     LE MMT Hip flexion: R 4+, L 4- Quad: R 5-, L 4+ Ankle DF: R 4+, L 4+ Hip ABD: R 4, L 4     GRIP STRENGTH DYNAMOMETRY (07/09/22) Best of 3 trials R: 71.3,  L: 41.6     Sensation C7 and C8 loss of light touch LUE. Proprioception and hot/cold testing deferred on this date.   Reflexes R/L Elbow: 2+/2+  Brachioradialis: 2+/2+  Tricep: 2+/2+   Palpation No TTP along L arm/forearm     SPECIAL TESTS Hoffman Sign (cervical cord compression): R: Negative L: Negative ULTT Median: R: Not examined L: Not examined ULTT Ulnar: R: Not examined L: Not examined ULTT Radial: R: Not examined L: Not examined      Clinical Test of Sensory Interaction for Balance    (CTSIB): Performed 07/09/22 CONDITION TIME STRATEGY SWAY  Eyes open, firm surface 30 seconds ankle   Eyes closed, firm surface 30 seconds ankle   Eyes open, foam surface 30 seconds ankle Increased  Eyes closed, foam surface 30 seconds Ankle, hip  Increased     Romberg: EO WNL, EC WNL (with increased sway)  DGI: 16/24  (07/09/22)      TODAY'S TREATMENT      Manual Therapy - for symptom modulation, soft tissue sensitivity and mobility, joint mobility, ROM   *NOT TODAY* L shoulder PROM within pt tolerance x 5 minutes R shoulder PROM within pt tolerance x 1 minute GHJ mobilization L shoulder; inferior glide gr III 2 x 30 sec, posterior glide gr III 2 x 30 sec - for joint mobility     Therapeutic Exercise - for improved soft tissue flexibility and extensibility as needed for ROM, improved strength as needed to improve performance of CKC activities/functional movements  NuStep; Level 3; 5 minutes  -arms at 8, seat at 11  -LUE AAROM and BLE aerobic work  Sit to stand, no UE assist;  2x10 Standing HR/TR, in bars; 2x10  Pulleys - shoulder flexion AAROM; x 2 minutes  Ball roll across table, horizontal for LUE; x20 Ball roll across table, up incline for RUE; x 20  PATIENT EDUCATION: Discussed  results of testing today and goals of PT moving forward.   *not today* Wand shoulder flexion AAROM; 2x10  -difficulty with eccentric control Wand shoulder ER AAROM; x20 Putty gripping; x 1 minute, Red putty   Neuromuscular Re-education - for improved sensory integration, static and dynamic postural control, equilibrium and non-equilibrium coordination as needed for negotiating home and community environment and stepping over obstacles  DGI performance  -discussed results of DGI    Toe tapping; 6-inch step; 2x10 alternating     PATIENT EDUCATION:  Education details: Plan of care Person educated: Patient Education method: Explanation Education comprehension: verbalized understanding     HOME EXERCISE PROGRAM: Access Code: [email or web address] URL: [email or web address] Date: 07/13/2022 Prepared by: Consuela Mimes  Exercises - Sit to Stand  - 2 x daily - 7 x weekly - 2 sets - 10 reps - Putty Squeezes  - 2 x daily - 7 x weekly - 1-2 sets - 2-3 min hold - Supine Shoulder Flexion with Dowel  - 2 x daily - 7 x weekly - 2 sets - 10 reps - Supine Shoulder External Rotation in 45 Degrees Abduction AAROM with Dowel  - 2 x daily - 7 x weekly - 2 sets - 10 reps - Seated Shoulder Flexion AAROM with Pulley Behind  - 2 x daily - 7 x weekly - 2-3 mins hold     ASSESSMENT:   CLINICAL IMPRESSION: Patient scored 16/24 on DGI today, indicative of some risk of falling. Pt is most challenged with stepping over obstacle and gait with horizontal head turns. Otherwise, pt exhibits mild to moderate deviations. Pt has limited arm swing and trunk motion during gait. He also exhibits decreased terminal knee extension at terminal swing. Pt is able to perform L shoulder elevation AAROM up to 90 deg and R shoulder elevation AAROM above shoulder height with ball roll exercise today on treatment table.  Pt will need continued work on restoring grip, upper limb AROM, postural control, and gait stability.  Patient will benefit from skilled PT to address above impairments and improve overall function.   REHAB POTENTIAL: Fair given complicated musculoskeletal history with multiple spinal surgeries, bilateral upper limb involvement   CLINICAL DECISION MAKING: Unstable/unpredictable   EVALUATION COMPLEXITY: High     GOALS: Goals reviewed with patient? Yes   SHORT TERM GOALS: Target date: 07/29/2022   Pt will be independent with HEP to improve strength and decrease  neck pain to improve pain-free function at home and work. Baseline: 07/06/22: HEP to be initiated and reviewed next visit.  Goal status: INITIAL     LONG TERM GOALS: Target date: 09/17/2022   Pt will increase FOTO to at least 53 to demonstrate significant improvement in function at home and work related to neck pain  Baseline: 07/06/22: 45 Goal status: INITIAL   2.  Patient will have active R shoulder flexion and abduction to 130 deg or greater as needed for functional reaching, self-care activities, household work Baseline: 07/06/22: R shoulder AROM flexion 30, ABD 38.   Goal status: INITIAL   3.  Pt will have grip strength (tested by dynamometer) within standard error of age-based/gender-based norm (R hand 88.2 lbs, L hand 83.78 lbs) indicative of improved ability to perform gripping/prehensile activities with each upper limb  Baseline: 07/06/22: Grip strength baseline to be formally tested next visit.   07/09/22: R: 71.3,  L: 41.6  Goal status: INITIAL   4.  Pt will score DGI > or equal to 19 indicative of no increased risk of falling  Baseline: 07/06/22: Baseline to be obtained on visit # 3.  07/13/22: 16/24 Goal status: INITIAL   5.  Pt will improve bilateral upper limb MMTs to 4+/5 indicative of improved strength as needed for moving items, carrying, lifting within post-op restrictions Baseline: 07/06/22: RUE strength grossly 4/5, LUE 2- to 4 Goal status: INITIAL     PLAN: PT FREQUENCY: 2x/week   PT DURATION: 10 weeks    PLANNED INTERVENTIONS: Therapeutic exercises, Therapeutic activity, Neuromuscular re-education, Balance training, Gait training, Patient/Family education, Dry Needling, Electrical stimulation, Cryotherapy, Moist heat, Taping, Ultrasound, and Manual therapy   PLAN FOR NEXT SESSION: Continue with work on shoulder/UE ROM and consider use of e-stim/NMES for improved muscle activation. Work on postural control and weightshifting drills as well as HEP for LE strengthening.       Consuela Mimes, PT, DPT [phone] 07/13/2022, 10:06 AM

## 2022-07-16 ENCOUNTER — Encounter: Payer: Self-pay | Admitting: Physical Therapy

## 2022-07-16 ENCOUNTER — Ambulatory Visit: Payer: 59 | Admitting: Physical Therapy

## 2022-07-16 DIAGNOSIS — R262 Difficulty in walking, not elsewhere classified: Secondary | ICD-10-CM

## 2022-07-16 DIAGNOSIS — R2689 Other abnormalities of gait and mobility: Secondary | ICD-10-CM

## 2022-07-16 DIAGNOSIS — Z981 Arthrodesis status: Secondary | ICD-10-CM

## 2022-07-16 DIAGNOSIS — M79621 Pain in right upper arm: Secondary | ICD-10-CM

## 2022-07-16 DIAGNOSIS — M6281 Muscle weakness (generalized): Secondary | ICD-10-CM | POA: Diagnosis not present

## 2022-07-16 NOTE — Therapy (Addendum)
OUTPATIENT PHYSICAL THERAPY TREATMENT NOTE   Patient Name: Tom Barrett MRN: [id number] DOB:1961-02-11, 62 y.o., male Today's Date: 07/16/2022  PCP: Olena Leatherwood, FNP  REFERRING PROVIDER: Douglass Rivers, PA*   END OF SESSION:   PT End of Session - 07/20/22 0600     Visit Number 4    Number of Visits 21    Date for PT Re-Evaluation 09/17/22    Authorization Type [employer] 2024, 30 per year PT/OT/speech    Progress Note Due on Visit 10    PT Start Time 1550    PT Stop Time 1638    PT Time Calculation (min) 48 min    Activity Tolerance Patient tolerated treatment well    Behavior During Therapy WFL for tasks assessed/performed               Past Medical History:  Diagnosis Date   Anemia    Arthritis    Carpal tunnel syndrome, bilateral    Complication of anesthesia    at 62 years old, he woke up being violent   Depression    Headache    eye migraine occasionally   Heberden's nodes    History of kidney stones    feels like he has passed a few stones   Hyperlipemia    Hypertension    Hypokalemia    Hypothyroidism    Malaise and fatigue    Sleep apnea    does not tolerate CPAP   Past Surgical History:  Procedure Laterality Date   CARPAL TUNNEL RELEASE Right 06/13/2020   Procedure: CARPAL TUNNEL RELEASE ENDOSCOPIC;  Surgeon: Christena Flake, MD;  Location: [facility] ORS;  Service: Orthopedics;  Laterality: Right;   CARPAL TUNNEL RELEASE Left 10/09/2020   Procedure: LEFT CARPAL TUNNEL RELEASE ENDOSCOPIC WITH SUBCUTANEOUS ANTERIOR TRANSPOSITION OF ULNER NERVE AT LEFT ELBOW.;  Surgeon: Christena Flake, MD;  Location: [facility];  Service: Orthopedics;  Laterality: Left;   CIRCUMCISION     as a teenager    COLONOSCOPY WITH PROPOFOL N/A 10/29/2014   Procedure: COLONOSCOPY WITH PROPOFOL;  Surgeon: Elnita Maxwell, MD;  Location: [facility] ENDOSCOPY;  Service: Endoscopy;  Laterality: N/A;   POSTERIOR CERVICAL LAMINECTOMY  12/23/2020   Procedure: POSTERIOR CERVICAL  LAMINECTOMY C3-6;  Surgeon: Lucy Chris, MD;  Location: [facility];  Service: Neurosurgery;;   TOTAL KNEE ARTHROPLASTY Right 11/09/2017   Procedure: TOTAL KNEE ARTHROPLASTY;  Surgeon: Christena Flake, MD;  Location: [facility];  Service: Orthopedics;  Laterality: Right;   TOTAL KNEE ARTHROPLASTY Left 10/10/2019   Procedure: TOTAL KNEE ARTHROPLASTY;  Surgeon: Christena Flake, MD;  Location: [facility];  Service: Orthopedics;  Laterality: Left;   Patient Active Problem List   Diagnosis Date Noted   Cervical stenosis of spinal canal 12/23/2020   Primary osteoarthritis of left knee 04/01/2018   Status post total knee replacement using cement, right 11/09/2017    REFERRING DIAG:  Z98.1 (ICD-10-CM) - S/P cervical spinal fusion   THERAPY DIAG:  Pain in right upper arm  Muscle weakness (generalized)  Difficulty in walking, not elsewhere classified  Imbalance  S/P cervical spinal fusion  Rationale for Evaluation and Treatment Rehabilitation  PERTINENT HISTORY: Pt is a 62 year old male s/p C4-7 ACDF 04/13/22; Hx of previous posterior cervical spine laminectomy C3-6; Hx of T6-8 aminoplasty in November 2023. 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 He reports immediate improvement in his R arm. He reports notable weakness with R arm; he reports ruling out RTC injury. Pt feels that  home health therapy did help. Patient reports minimal strength in L hand. Pt reports some residual numbness affecting L>RUE. Patient reports history of forearm pain along extensors in each forearm that has improved. Pt reports not having major pain at arrival. He reports sensation of fullness in his hands. He reports mainly having numbness and weakness. Pt denies hypersensitivity or pain with light touch along L arm region. Pt using gabapentin for symptom management.    Pain:  Pain Intensity: Present: 0/10, Best: 0/10, Worst: 6/10 Pain location: L upper arm along distal 1/3 laterally Pain Quality:  numbness, sharp pain along L arm     Radiating: No, symptoms are localized along R arm  Numbness/Tingling: Yes,sensory loss, no paresthesias Focal Weakness: Yes, L shoulder L>R hand  Aggravating factors: neck stretches given from hospital Relieving factors: nothing seems to help arms, Gabapentin 24-hour pain behavior: AM is worst  History of prior neck injury, pain, surgery, or therapy: Yes; complex surgical history (see above)  Falls: Has patient fallen in last 6 months? No, Number of falls: N/A Follow-up appointment with MD: Yes, this Wednesday 07/08/22 Dominant hand: right Imaging: Yes ;   Post-op radiographs negative for any acute abnormalities/hardware issues     Prior level of function: Independent with community mobility with device; pt uses walking stick outside; IND with self-care/hygiene  Occupational demands: Pt out of work from previous job  Hobbies: fishing, able to play with grandchildren (4 y/o, 2y/o, 1 y/o)   Red flags (personal history of cancer, h/o spinal tumors, history of compression fracture, chills/fever, night sweats, nausea, vomiting, unrelenting pain): Negative   Weight Bearing Restrictions: No   Living Environment Lives with: lives with their spouse; wife is able to help patient at home. Pt is alone during the day when wife is at work. Grab bars in bathroom. Built-in bench. Pt has non-skid mat. 6 stairs to get in home with rails on both side. Flat terrain to get into home - sidewalk up to his front door  Lives in: House/apartment     Patient Goals:  More use of his hands and arms; would like to walk better       PRECAUTIONS: No lifting > 20 lbs, impaired gait/fall risk  SUBJECTIVE:                                                                                                                                                                                      SUBJECTIVE STATEMENT:  Patient reports notable paresthesias affecting his hands; he states it is worse after exercise/walking today.  Patient reports walking more briskly today and he feels this may have worsened it. Patient reports that  more numbness in his L hand with exercise has been chronic issue.    PAIN:  Are you having pain? No upper quarter pain. He reports numbness affecting arms/hands.  Pain location: Upper arms, hands    OBJECTIVE: (objective measures completed at initial evaluation unless otherwise dated)   Patient Surveys  FOTO 45, predicted outcome score of 85   Cognition Patient is oriented to person, place, and time.  Recent memory is intact.  Remote memory is intact.  Attention span and concentration are intact.  Expressive speech is intact.  Patient's fund of knowledge is within normal limits for educational level.                          Gross Musculoskeletal Assessment Tremor: None Bulk: Normal Tone: Normal     Gait Abductor lurch toward his L side, decreased TKE at terminal swing, dec arm swing and stiff trunk. Overpronation of bilat feet.    Posture Guarded posture, rounded shoulders      AROM AROM (Normal range in degrees) AROM 07/08/2022  Cervical  Flexion (50) 25  Extension (80) 30  Right lateral flexion (45) 28  Left lateral flexion (45) 23  Right rotation (85) 42  Left rotation (85) 33  (* = pain; Blank rows = not tested)     Shoulder AROM Shoulder flexion: R 144, L 30 Shoulder abduction: R 131, L 38  Shoulder ER: R WNL, L 47      MMT MMT (out of 5) Right 07/08/2022 Left 07/08/2022  Cervical (isometric)  Flexion WNL  Extension WNL  Lateral Flexion WNL WNL  Rotation WNL WNL         Shoulder   Flexion 4 2-  Extension      Abduction 4 2-  Internal rotation 4 4  External rotation  4 3+  Horizontal abduction      Horizontal adduction      Lower Trapezius      Rhomboids             Elbow  Flexion 4 3+  Extension 4 4  Pronation      Supination             (* = pain; Blank rows = not tested)     LE MMT Hip flexion: R 4+, L 4- Quad: R 5-, L  4+ Ankle DF: R 4+, L 4+ Hip ABD: R 4, L 4     GRIP STRENGTH DYNAMOMETRY (07/09/22) Best of 3 trials R: 71.3,  L: 41.6     Sensation C7 and C8 loss of light touch LUE. Proprioception and hot/cold testing deferred on this date.   Reflexes R/L Elbow: 2+/2+  Brachioradialis: 2+/2+  Tricep: 2+/2+   Palpation No TTP along L arm/forearm     SPECIAL TESTS Hoffman Sign (cervical cord compression): R: Negative L: Negative ULTT Median: R: Not examined L: Not examined ULTT Ulnar: R: Not examined L: Not examined ULTT Radial: R: Not examined L: Not examined      Clinical Test of Sensory Interaction for Balance    (CTSIB): Performed 07/09/22 CONDITION TIME STRATEGY SWAY  Eyes open, firm surface 30 seconds ankle   Eyes closed, firm surface 30 seconds ankle   Eyes open, foam surface 30 seconds ankle Increased  Eyes closed, foam surface 30 seconds Ankle, hip           Increased     Romberg: EO WNL, EC WNL (with increased sway)  DGI: 16/24  (07/09/22)      TODAY'S TREATMENT      Manual Therapy - for symptom modulation, soft tissue sensitivity and mobility, joint mobility, ROM   (+) ULTT for median nerve (musculocutaneous bias) for LUE;   (-) ULTTs for RUE  Median n. Floss, musculocutaneous bias; x 20 with each UE  L shoulder PROM within pt tolerance x 5 minutes R shoulder PROM within pt tolerance x 1 minute   *NOT TODAY* GHJ mobilization L shoulder; inferior glide gr III 2 x 30 sec, posterior glide gr III 2 x 30 sec - for joint mobility     Therapeutic Exercise - for improved soft tissue flexibility and extensibility as needed for ROM, improved strength as needed to improve performance of CKC activities/functional movements  NuStep; Level 2; 5 minutes  -arms at 9, seat at 11  -LUE AAROM and BLE aerobic work   Attempted supine L shoulder AROM, difficulty actively flexing up to 90 deg in supine  Wand shoulder flexion AAROM; 2x10  -fair eccentric control Ball roll  across table, Silver physioball; up incline for bilat UE; x 20 Ball up wall, pt standing at wall; Silver physioball; x15 bilat UE   PATIENT EDUCATION: Discussed role of neurodynamics, ensuring paresthesias reproduced are temporary. We discussed f/u with PT on how paresthesias/numbness progress through the day today.   *not today* Sit to stand, no UE assist;  2x10 Pulleys - shoulder flexion AAROM; x 2 minutes Standing HR/TR, in bars; 2x10 Wand shoulder ER AAROM; x20 Putty gripping; x 1 minute, Red putty   Neuromuscular Re-education - for improved sensory integration, static and dynamic postural control, equilibrium and non-equilibrium coordination as needed for negotiating home and community environment and stepping over obstacles    Toe tapping; 6-inch step; 2x10 alternating Hurdle step, over 6-inch hurdle; 2x10 with each LE  -intermittent R hand hold on adjacent armrest Tandem stance; 1 x 30 second on each side     PATIENT EDUCATION:  Education details: Plan of care Person educated: Patient Education method: Explanation Education comprehension: verbalized understanding     HOME EXERCISE PROGRAM: Access Code: [email or web address] URL: [email or web address] Date: 07/13/2022 Prepared by: Consuela Mimes  Exercises - Sit to Stand  - 2 x daily - 7 x weekly - 2 sets - 10 reps - Putty Squeezes  - 2 x daily - 7 x weekly - 1-2 sets - 2-3 min hold - Supine Shoulder Flexion with Dowel  - 2 x daily - 7 x weekly - 2 sets - 10 reps - Supine Shoulder External Rotation in 45 Degrees Abduction AAROM with Dowel  - 2 x daily - 7 x weekly - 2 sets - 10 reps - Seated Shoulder Flexion AAROM with Pulley Behind  - 2 x daily - 7 x weekly - 2-3 mins hold     ASSESSMENT:   CLINICAL IMPRESSION: Patient demonstrates improving heel to toe progression and step length in clinic. He does still ambulate with slight knee flexion and decreased truncal rotation/arm swing. Pt is able to maintain full  Tandem x 30 sec. He is more challenged with dynamic/weight shifting balance tasks in clinic. Pt demonstrates improving ability to access forward elevation AAROM in supine and standing positions; marked deltoid weakness is still apparent.  Pt will need continued work on restoring grip, upper limb AROM, postural control, and gait stability. Patient will benefit from skilled PT to address above impairments and improve overall function.   REHAB POTENTIAL: Fair given complicated musculoskeletal history with  multiple spinal surgeries, bilateral upper limb involvement   CLINICAL DECISION MAKING: Unstable/unpredictable   EVALUATION COMPLEXITY: High     GOALS: Goals reviewed with patient? Yes   SHORT TERM GOALS: Target date: 07/29/2022   Pt will be independent with HEP to improve strength and decrease neck pain to improve pain-free function at home and work. Baseline: 07/06/22: HEP to be initiated and reviewed next visit.  Goal status: INITIAL     LONG TERM GOALS: Target date: 09/17/2022   Pt will increase FOTO to at least 53 to demonstrate significant improvement in function at home and work related to neck pain  Baseline: 07/06/22: 45 Goal status: INITIAL   2.  Patient will have active R shoulder flexion and abduction to 130 deg or greater as needed for functional reaching, self-care activities, household work Baseline: 07/06/22: R shoulder AROM flexion 30, ABD 38.   Goal status: INITIAL   3.  Pt will have grip strength (tested by dynamometer) within standard error of age-based/gender-based norm (R hand 88.2 lbs, L hand 83.78 lbs) indicative of improved ability to perform gripping/prehensile activities with each upper limb  Baseline: 07/06/22: Grip strength baseline to be formally tested next visit.   07/09/22: R: 71.3,  L: 41.6  Goal status: INITIAL   4.  Pt will score DGI > or equal to 19 indicative of no increased risk of falling  Baseline: 07/06/22: Baseline to be obtained on visit # 3.  07/13/22:  16/24 Goal status: INITIAL   5.  Pt will improve bilateral upper limb MMTs to 4+/5 indicative of improved strength as needed for moving items, carrying, lifting within post-op restrictions Baseline: 07/06/22: RUE strength grossly 4/5, LUE 2- to 4 Goal status: INITIAL     PLAN: PT FREQUENCY: 2x/week   PT DURATION: 10 weeks   PLANNED INTERVENTIONS: Therapeutic exercises, Therapeutic activity, Neuromuscular re-education, Balance training, Gait training, Patient/Family education, Dry Needling, Electrical stimulation, Cryotherapy, Moist heat, Taping, Ultrasound, and Manual therapy   PLAN FOR NEXT SESSION: Continue with work on shoulder/UE ROM and consider use of e-stim/NMES for improved muscle activation. Work on postural control and weightshifting drills as well as HEP for LE strengthening.       Consuela Mimes, PT, DPT [phone] 07/20/2022, 6:00 AM

## 2022-07-20 ENCOUNTER — Ambulatory Visit: Payer: 59 | Admitting: Physical Therapy

## 2022-07-20 DIAGNOSIS — Z981 Arthrodesis status: Secondary | ICD-10-CM

## 2022-07-20 DIAGNOSIS — M6281 Muscle weakness (generalized): Secondary | ICD-10-CM | POA: Diagnosis not present

## 2022-07-20 DIAGNOSIS — M79621 Pain in right upper arm: Secondary | ICD-10-CM

## 2022-07-20 DIAGNOSIS — R262 Difficulty in walking, not elsewhere classified: Secondary | ICD-10-CM

## 2022-07-20 DIAGNOSIS — R2689 Other abnormalities of gait and mobility: Secondary | ICD-10-CM | POA: Diagnosis not present
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
## 2022-07-23 ENCOUNTER — Ambulatory Visit: Payer: 59 | Admitting: Physical Therapy

## 2022-07-23 ENCOUNTER — Encounter: Payer: Self-pay | Admitting: Physical Therapy

## 2022-07-23 DIAGNOSIS — M6281 Muscle weakness (generalized): Secondary | ICD-10-CM

## 2022-07-23 DIAGNOSIS — M79621 Pain in right upper arm: Secondary | ICD-10-CM | POA: Diagnosis not present

## 2022-07-23 DIAGNOSIS — R262 Difficulty in walking, not elsewhere classified: Secondary | ICD-10-CM

## 2022-07-23 DIAGNOSIS — R2689 Other abnormalities of gait and mobility: Secondary | ICD-10-CM | POA: Diagnosis not present

## 2022-07-23 DIAGNOSIS — Z981 Arthrodesis status: Secondary | ICD-10-CM
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
## 2022-07-30 ENCOUNTER — Ambulatory Visit: Payer: 59

## 2022-07-30 DIAGNOSIS — Z981 Arthrodesis status: Secondary | ICD-10-CM | POA: Diagnosis not present

## 2022-07-30 DIAGNOSIS — M6281 Muscle weakness (generalized): Secondary | ICD-10-CM | POA: Diagnosis not present

## 2022-07-30 DIAGNOSIS — R2689 Other abnormalities of gait and mobility: Secondary | ICD-10-CM

## 2022-07-30 DIAGNOSIS — R262 Difficulty in walking, not elsewhere classified: Secondary | ICD-10-CM | POA: Diagnosis not present

## 2022-07-30 DIAGNOSIS — M79621 Pain in right upper arm: Secondary | ICD-10-CM

## 2022-07-30 NOTE — Therapy (Signed)
OUTPATIENT PHYSICAL THERAPY TREATMENT NOTE   Patient Name: Tom Barrett MRN: [id number] DOB:[birth year], 62 y.o., male Today's Date: 07/30/2022  PCP: Olena Leatherwood, FNP  REFERRING PROVIDER: Douglass Rivers, PA*   END OF SESSION:   PT End of Session - 07/30/22 1649     Visit Number 7    Number of Visits 21    Date for PT Re-Evaluation 09/17/22    Authorization Type [employer] 2024, 30 per year PT/OT/speech    Progress Note Due on Visit 10    PT Start Time 1648    PT Stop Time 1730    PT Time Calculation (min) 42 min    Activity Tolerance Patient tolerated treatment well    Behavior During Therapy WFL for tasks assessed/performed                Past Medical History:  Diagnosis Date   Anemia    Arthritis    Carpal tunnel syndrome, bilateral    Complication of anesthesia    at 62 years old, he woke up being violent   Depression    Headache    eye migraine occasionally   Heberden's nodes    History of kidney stones    feels like he has passed a few stones   Hyperlipemia    Hypertension    Hypokalemia    Hypothyroidism    Malaise and fatigue    Sleep apnea    does not tolerate CPAP   Past Surgical History:  Procedure Laterality Date   CARPAL TUNNEL RELEASE Right 06/13/2020   Procedure: CARPAL TUNNEL RELEASE ENDOSCOPIC;  Surgeon: Christena Flake, MD;  Location: [facility] ORS;  Service: Orthopedics;  Laterality: Right;   CARPAL TUNNEL RELEASE Left 10/09/2020   Procedure: LEFT CARPAL TUNNEL RELEASE ENDOSCOPIC WITH SUBCUTANEOUS ANTERIOR TRANSPOSITION OF ULNER NERVE AT LEFT ELBOW.;  Surgeon: Christena Flake, MD;  Location: [facility];  Service: Orthopedics;  Laterality: Left;   CIRCUMCISION     as a teenager    COLONOSCOPY WITH PROPOFOL N/A 10/29/2014   Procedure: COLONOSCOPY WITH PROPOFOL;  Surgeon: Elnita Maxwell, MD;  Location: [facility] ENDOSCOPY;  Service: Endoscopy;  Laterality: N/A;   POSTERIOR CERVICAL LAMINECTOMY  12/23/2020   Procedure: POSTERIOR CERVICAL  LAMINECTOMY C3-6;  Surgeon: Lucy Chris, MD;  Location: [facility];  Service: Neurosurgery;;   TOTAL KNEE ARTHROPLASTY Right 11/09/2017   Procedure: TOTAL KNEE ARTHROPLASTY;  Surgeon: Christena Flake, MD;  Location: [facility];  Service: Orthopedics;  Laterality: Right;   TOTAL KNEE ARTHROPLASTY Left 10/10/2019   Procedure: TOTAL KNEE ARTHROPLASTY;  Surgeon: Christena Flake, MD;  Location: [facility] ORS;  Service: Orthopedics;  Laterality: Left;   Patient Active Problem List   Diagnosis Date Noted   Cervical stenosis of spinal canal 12/23/2020   Primary osteoarthritis of left knee 04/01/2018   Status post total knee replacement using cement, right 11/09/2017    REFERRING DIAG:  Z98.1 (ICD-10-CM) - S/P cervical spinal fusion   THERAPY DIAG:  Pain in right upper arm  Muscle weakness (generalized)  Imbalance  S/P cervical spinal fusion  Difficulty in walking, not elsewhere classified  Rationale for Evaluation and Treatment Rehabilitation  PERTINENT HISTORY: Pt is a 62 year old male s/p C4-7 ACDF 04/13/22; Hx of previous posterior cervical spine laminectomy C3-6; Hx of T6-8 aminoplasty in November 2023. 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 He reports immediate improvement in his R arm. He reports notable weakness with R arm; he reports ruling out RTC injury. Pt feels  that home health therapy did help. Patient reports minimal strength in L hand. Pt reports some residual numbness affecting L>RUE. Patient reports history of forearm pain along extensors in each forearm that has improved. Pt reports not having major pain at arrival. He reports sensation of fullness in his hands. He reports mainly having numbness and weakness. Pt denies hypersensitivity or pain with light touch along L arm region. Pt using gabapentin for symptom management.    Pain:  Pain Intensity: Present: 0/10, Best: 0/10, Worst: 6/10 Pain location: L upper arm along distal 1/3 laterally Pain Quality:  numbness, sharp pain along L arm     Radiating: No, symptoms are localized along R arm  Numbness/Tingling: Yes,sensory loss, no paresthesias Focal Weakness: Yes, L shoulder L>R hand  Aggravating factors: neck stretches given from hospital Relieving factors: nothing seems to help arms, Gabapentin 24-hour pain behavior: AM is worst  History of prior neck injury, pain, surgery, or therapy: Yes; complex surgical history (see above)  Falls: Has patient fallen in last 6 months? No, Number of falls: N/A Follow-up appointment with MD: Yes, this Wednesday 07/08/22 Dominant hand: right Imaging: Yes ;   Post-op radiographs negative for any acute abnormalities/hardware issues     Prior level of function: Independent with community mobility with device; pt uses walking stick outside; IND with self-care/hygiene  Occupational demands: Pt out of work from previous job  Hobbies: fishing, able to play with grandchildren (4 y/o, 2y/o, 1 y/o)   Red flags (personal history of cancer, h/o spinal tumors, history of compression fracture, chills/fever, night sweats, nausea, vomiting, unrelenting pain): Negative   Weight Bearing Restrictions: No   Living Environment Lives with: lives with their spouse; wife is able to help patient at home. Pt is alone during the day when wife is at work. Grab bars in bathroom. Built-in bench. Pt has non-skid mat. 6 stairs to get in home with rails on both side. Flat terrain to get into home - sidewalk up to his front door  Lives in: House/apartment     Patient Goals:  More use of his hands and arms; would like to walk better       PRECAUTIONS: No lifting > 20 lbs, impaired gait/fall risk     SUBJECTIVE:                                                                                                                                                                                      SUBJECTIVE STATEMENT:  Pt continues to have soreness/pain in R shoulder at anterior deltoid/biceps region. Reports continuing  to be busy with household tasks using his RUE with painting his deck to  help his wife and believes he may be overdoing it. Pt denies falls. Remains with BUE N/T in hands and arms.  PAIN:  Are you having pain? No upper quarter pain. He reports numbness affecting arms/hands.  Pain location: Upper arms, hands    OBJECTIVE: (objective measures completed at initial evaluation unless otherwise dated)   Patient Surveys  FOTO 45, predicted outcome score of 37   Cognition Patient is oriented to person, place, and time.  Recent memory is intact.  Remote memory is intact.  Attention span and concentration are intact.  Expressive speech is intact.  Patient's fund of knowledge is within normal limits for educational level.                          Gross Musculoskeletal Assessment Tremor: None Bulk: Normal Tone: Normal     Gait Abductor lurch toward his L side, decreased TKE at terminal swing, dec arm swing and stiff trunk. Overpronation of bilat feet.    Posture Guarded posture, rounded shoulders      AROM AROM (Normal range in degrees) AROM 07/08/2022  Cervical  Flexion (50) 25  Extension (80) 30  Right lateral flexion (45) 28  Left lateral flexion (45) 23  Right rotation (85) 42  Left rotation (85) 33  (* = pain; Blank rows = not tested)     Shoulder AROM Shoulder flexion: R 144, L 30 Shoulder abduction: R 131, L 38  Shoulder ER: R WNL, L 47      MMT MMT (out of 5) Right 07/08/2022 Left 07/08/2022  Cervical (isometric)  Flexion WNL  Extension WNL  Lateral Flexion WNL WNL  Rotation WNL WNL         Shoulder   Flexion 4 2-  Extension      Abduction 4 2-  Internal rotation 4 4  External rotation  4 3+  Horizontal abduction      Horizontal adduction      Lower Trapezius      Rhomboids             Elbow  Flexion 4 3+  Extension 4 4  Pronation      Supination             (* = pain; Blank rows = not tested)     LE MMT Hip flexion: R 4+, L 4- Quad: R 5-, L  4+ Ankle DF: R 4+, L 4+ Hip ABD: R 4, L 4     GRIP STRENGTH DYNAMOMETRY (07/09/22) Best of 3 trials R: 71.3,  L: 41.6     Sensation C7 and C8 loss of light touch LUE. Proprioception and hot/cold testing deferred on this date.   Reflexes R/L Elbow: 2+/2+  Brachioradialis: 2+/2+  Tricep: 2+/2+   Palpation No TTP along L arm/forearm     SPECIAL TESTS Hoffman Sign (cervical cord compression): R: Negative L: Negative ULTT Median: R: Not examined L: Not examined ULTT Ulnar: R: Not examined L: Not examined ULTT Radial: R: Not examined L: Not examined      Clinical Test of Sensory Interaction for Balance    (CTSIB): Performed 07/09/22 CONDITION TIME STRATEGY SWAY  Eyes open, firm surface 30 seconds ankle   Eyes closed, firm surface 30 seconds ankle   Eyes open, foam surface 30 seconds ankle Increased  Eyes closed, foam surface 30 seconds Ankle, hip           Increased     Romberg:  EO WNL, EC WNL (with increased sway)  DGI: 16/24  (07/09/22)      TODAY'S TREATMENT      Manual Therapy - for symptom modulation, soft tissue sensitivity and mobility, joint mobility, ROM    R middle deltoid, anterior deltoid, and biceps STM; x 2 minutes  R shoulder PROM (x10 flexion and x10 abduction within pt tolerance)  L shoulder PROM within pt tolerance (x10 flexion and x10 abduction within pt tolerance)    Therapeutic Exercise - for improved soft tissue flexibility and extensibility as needed for ROM, improved strength as needed to improve performance of CKC activities/functional movements   Wand shoulder flexion/abduction AAROM; 2x10. TC's for abduction for correct form/technique  -fair eccentric control  Ball up wall, pt standing at wall; Silver physioball; 2x15 bilat UE  Alternating foot taps on 12" step without UE support: 2x10/LE   Lateral side steps over x3, 6" hurdles with SUE support and CGA. Completing x4 laps beside TM bar    PATIENT EDUCATION:  Education details:  Plan of care Person educated: Patient Education method: Explanation Education comprehension: verbalized understanding     HOME EXERCISE PROGRAM: Access Code: [email or web address] URL: [email or web address] Date: 07/13/2022 Prepared by: Consuela Mimes  Exercises - Sit to Stand  - 2 x daily - 7 x weekly - 2 sets - 10 reps - Putty Squeezes  - 2 x daily - 7 x weekly - 1-2 sets - 2-3 min hold - Supine Shoulder Flexion with Dowel  - 2 x daily - 7 x weekly - 2 sets - 10 reps - Supine Shoulder External Rotation in 45 Degrees Abduction AAROM with Dowel  - 2 x daily - 7 x weekly - 2 sets - 10 reps - Seated Shoulder Flexion AAROM with Pulley Behind  - 2 x daily - 7 x weekly - 2-3 mins hold     ASSESSMENT:   CLINICAL IMPRESSION: Pt tolerating AAROM with L shoulder without worsening pain or N/T reported this date. Continue to focus heavily on improving L shoulder forward flexion mobility for ADL completion. Continuing to also work on Editor, commissioning with weight shifting, single limb stance, and step over obstacle negotiation in sagittal and frontal planes. Pt require regular CGA and at least SUE support to prevent falls with these balance tasks. Pt will continue to benefit from skilled PT to address UE mobility, sensation, and strength impairments along with dynamic balance/gait tasks to reduce falls risk and optimize independence in ADL completion.    REHAB POTENTIAL: Fair given complicated musculoskeletal history with multiple spinal surgeries, bilateral upper limb involvement   CLINICAL DECISION MAKING: Unstable/unpredictable   EVALUATION COMPLEXITY: High     GOALS: Goals reviewed with patient? Yes   SHORT TERM GOALS: Target date: 07/29/2022   Pt will be independent with HEP to improve strength and decrease neck pain to improve pain-free function at home and work. Baseline: 07/06/22: HEP to be initiated and reviewed next visit.  Goal status: INITIAL     LONG TERM GOALS: Target date:  09/17/2022   Pt will increase FOTO to at least 53 to demonstrate significant improvement in function at home and work related to neck pain  Baseline: 07/06/22: 45 Goal status: INITIAL   2.  Patient will have active R shoulder flexion and abduction to 130 deg or greater as needed for functional reaching, self-care activities, household work Baseline: 07/06/22: R shoulder AROM flexion 30, ABD 38.   Goal status: INITIAL   3.  Pt will have grip strength (  tested by dynamometer) within standard error of age-based/gender-based norm (R hand 88.2 lbs, L hand 83.78 lbs) indicative of improved ability to perform gripping/prehensile activities with each upper limb  Baseline: 07/06/22: Grip strength baseline to be formally tested next visit.   07/09/22: R: 71.3,  L: 41.6  Goal status: INITIAL   4.  Pt will score DGI > or equal to 19 indicative of no increased risk of falling  Baseline: 07/06/22: Baseline to be obtained on visit # 3.  07/13/22: 16/24 Goal status: INITIAL   5.  Pt will improve bilateral upper limb MMTs to 4+/5 indicative of improved strength as needed for moving items, carrying, lifting within post-op restrictions Baseline: 07/06/22: RUE strength grossly 4/5, LUE 2- to 4 Goal status: INITIAL     PLAN: PT FREQUENCY: 2x/week   PT DURATION: 10 weeks   PLANNED INTERVENTIONS: Therapeutic exercises, Therapeutic activity, Neuromuscular re-education, Balance training, Gait training, Patient/Family education, Dry Needling, Electrical stimulation, Cryotherapy, Moist heat, Taping, Ultrasound, and Manual therapy   PLAN FOR NEXT SESSION: Continue with work on shoulder/UE ROM and consider use of e-stim/NMES for improved muscle activation. Work on postural control and weightshifting drills as well as HEP for LE strengthening.      Delphia Grates. Fairly IV, PT, DPT Physical Therapist- [place]  [facility]  07/30/2022, 9:49 PM

## 2022-08-03 ENCOUNTER — Ambulatory Visit: Payer: 59 | Attending: Physician Assistant

## 2022-08-03 DIAGNOSIS — M79621 Pain in right upper arm: Secondary | ICD-10-CM | POA: Insufficient documentation

## 2022-08-03 DIAGNOSIS — M6281 Muscle weakness (generalized): Secondary | ICD-10-CM | POA: Insufficient documentation

## 2022-08-03 DIAGNOSIS — R262 Difficulty in walking, not elsewhere classified: Secondary | ICD-10-CM | POA: Insufficient documentation

## 2022-08-03 DIAGNOSIS — Z981 Arthrodesis status: Secondary | ICD-10-CM | POA: Insufficient documentation

## 2022-08-03 DIAGNOSIS — R2689 Other abnormalities of gait and mobility: Secondary | ICD-10-CM | POA: Diagnosis not present

## 2022-08-03 NOTE — Therapy (Signed)
OUTPATIENT PHYSICAL THERAPY TREATMENT    Patient Name: Tom Barrett MRN: [id number] DOB:1961/02/24, 62 y.o., male Today's Date: 08/03/2022  PCP: Olena Leatherwood, FNP  REFERRING PROVIDER: Douglass Rivers, PA*   END OF SESSION:   PT End of Session - 08/03/22 1332     Visit Number 8    Number of Visits 21    Date for PT Re-Evaluation 09/17/22    Authorization Type [employer] 2024, 30 per year PT/OT/speech    Authorization Time Period 07/06/22-09/14/22    Progress Note Due on Visit 10    PT Start Time 1245    PT Stop Time 1325    PT Time Calculation (min) 40 min    Activity Tolerance Patient tolerated treatment well;Patient limited by pain    Behavior During Therapy [facility] for tasks assessed/performed                Past Medical History:  Diagnosis Date   Anemia    Arthritis    Carpal tunnel syndrome, bilateral    Complication of anesthesia    at 61 years old, he woke up being violent   Depression    Headache    eye migraine occasionally   Heberden's nodes    History of kidney stones    feels like he has passed a few stones   Hyperlipemia    Hypertension    Hypokalemia    Hypothyroidism    Malaise and fatigue    Sleep apnea    does not tolerate CPAP   Past Surgical History:  Procedure Laterality Date   CARPAL TUNNEL RELEASE Right 06/13/2020   Procedure: CARPAL TUNNEL RELEASE ENDOSCOPIC;  Surgeon: Christena Flake, MD;  Location: [facility] ORS;  Service: Orthopedics;  Laterality: Right;   CARPAL TUNNEL RELEASE Left 10/09/2020   Procedure: LEFT CARPAL TUNNEL RELEASE ENDOSCOPIC WITH SUBCUTANEOUS ANTERIOR TRANSPOSITION OF ULNER NERVE AT LEFT ELBOW.;  Surgeon: Christena Flake, MD;  Location: [facility] ORS;  Service: Orthopedics;  Laterality: Left;   CIRCUMCISION     as a teenager    COLONOSCOPY WITH PROPOFOL N/A 10/29/2014   Procedure: COLONOSCOPY WITH PROPOFOL;  Surgeon: Elnita Maxwell, MD;  Location: [facility] ENDOSCOPY;  Service: Endoscopy;  Laterality: N/A;   POSTERIOR CERVICAL  LAMINECTOMY  12/23/2020   Procedure: POSTERIOR CERVICAL LAMINECTOMY C3-6;  Surgeon: Lucy Chris, MD;  Location: [facility] ORS;  Service: Neurosurgery;;   TOTAL KNEE ARTHROPLASTY Right 11/09/2017   Procedure: TOTAL KNEE ARTHROPLASTY;  Surgeon: Christena Flake, MD;  Location: [facility] ORS;  Service: Orthopedics;  Laterality: Right;   TOTAL KNEE ARTHROPLASTY Left 10/10/2019   Procedure: TOTAL KNEE ARTHROPLASTY;  Surgeon: Christena Flake, MD;  Location: [facility] ORS;  Service: Orthopedics;  Laterality: Left;   Patient Active Problem List   Diagnosis Date Noted   Cervical stenosis of spinal canal 12/23/2020   Primary osteoarthritis of left knee 04/01/2018   Status post total knee replacement using cement, right 11/09/2017    REFERRING DIAG:  Z98.1 (ICD-10-CM) - S/P cervical spinal fusion   THERAPY DIAG:  Pain in right upper arm  Muscle weakness (generalized)  Imbalance  S/P cervical spinal fusion  Difficulty in walking, not elsewhere classified  Rationale for Evaluation and Treatment Rehabilitation  PERTINENT HISTORY: Pt is a 62 year old male s/p C4-7 ACDF 04/13/22; Hx of previous posterior cervical spine laminectomy C3-6; Hx of T6-8 aminoplasty in November 2023. 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 He reports immediate improvement in his R arm. He reports notable weakness with  R arm; he reports ruling out RTC injury. Pt feels that home health therapy did help. Patient reports minimal strength in L hand. Pt reports some residual numbness affecting L>RUE. Patient reports history of forearm pain along extensors in each forearm that has improved. Pt reports not having major pain at arrival. He reports sensation of fullness in his hands. He reports mainly having numbness and weakness. Pt denies hypersensitivity or pain with light touch along L arm region. Pt using gabapentin for symptom management.     PRECAUTIONS: No lifting > 20 lbs, impaired gait/fall risk  SUBJECTIVE:                                                                                                                                                                                       SUBJECTIVE STATEMENT:  Pain remains elevated, pt has not returned to his HEP routine, still busy with decking work. Left shoulder appears anterior and superior subluxed on arrival, difficult to assess due to hypertonic strate.   PAIN:  Are you having pain? Yep, 3/10 bilat arms   OBJECTIVE:   TODAY'S TREATMENT 08/03/22   -LUE P/ROM shoulder and elbow, range as tolerated, LLLD stretching, gentle long axis distraction to improve global shoulder tone x15 minutes -RUE AR/ROM shoulder 4 ways, elbow 2 ways 3lb free weight, assisted with control as not to sustain injury to face    PATIENT EDUCATION:  Education details: Plan of care Person educated: Patient Education method: Explanation Education comprehension: verbalized understanding     HOME EXERCISE PROGRAM: Access Code: [email or web address] URL: [email or web address] Date: 07/13/2022 Prepared by: Consuela Mimes  Exercises - Sit to Stand  - 2 x daily - 7 x weekly - 2 sets - 10 reps - Putty Squeezes  - 2 x daily - 7 x weekly - 1-2 sets - 2-3 min hold - Supine Shoulder Flexion with Dowel  - 2 x daily - 7 x weekly - 2 sets - 10 reps - Supine Shoulder External Rotation in 45 Degrees Abduction AAROM with Dowel  - 2 x daily - 7 x weekly - 2 sets - 10 reps - Seated Shoulder Flexion AAROM with Pulley Behind  - 2 x daily - 7 x weekly - 2-3 mins hold     ASSESSMENT:   CLINICAL IMPRESSION: Pt reports pain still elevated from busy week trying assist wife with repainting deck. Left shoulder looks suspect for subluxation on arrial, appears inferior and anterior, however tone makes it difficult to assess position of humeral head. Pt assisted with gentle ROM of LUE to reduce tone and maintain joint ROM. Symptoms modified as needed to reduce intermittent neurological pain. Pt assisted  with strengthening  of RUE as tolerated as well. Pt encouraged to return to consistent performance of ROM at home to maintain joint nutrition/health and to help with ST management of hypertonic limb.    REHAB POTENTIAL: Fair given complicated musculoskeletal history with multiple spinal surgeries, bilateral upper limb involvement   CLINICAL DECISION MAKING: Unstable/unpredictable   EVALUATION COMPLEXITY: High     GOALS: Goals reviewed with patient? Yes   SHORT TERM GOALS: Target date: 07/29/2022   Pt will be independent with HEP to improve strength and decrease neck pain to improve pain-free function at home and work. Baseline: 07/06/22: HEP to be initiated and reviewed next visit.  Goal status: Achieved      LONG TERM GOALS: Target date: 09/17/2022   Pt will increase FOTO to at least 53 to demonstrate significant improvement in function at home and work related to neck pain  Baseline: 07/06/22: 45 Goal status: INITIAL   2.  Patient will have active R shoulder flexion and abduction to 130 deg or greater as needed for functional reaching, self-care activities, household work Baseline: 07/06/22: R shoulder AROM flexion 30, ABD 38.   Goal status: INITIAL   3.  Pt will have grip strength (tested by dynamometer) within standard error of age-based/gender-based norm (R hand 88.2 lbs, L hand 83.78 lbs) indicative of improved ability to perform gripping/prehensile activities with each upper limb  Baseline: 07/06/22: Grip strength baseline to be formally tested next visit.   07/09/22: R: 71.3,  L: 41.6  Goal status: INITIAL   4.  Pt will score DGI > or equal to 19 indicative of no increased risk of falling  Baseline: 07/06/22: Baseline to be obtained on visit # 3.  07/13/22: 16/24 Goal status: INITIAL   5.  Pt will improve bilateral upper limb MMTs to 4+/5 indicative of improved strength as needed for moving items, carrying, lifting within post-op restrictions Baseline: 07/06/22: RUE strength grossly 4/5, LUE 2- to 4 Goal  status: INITIAL     PLAN: PT FREQUENCY: 2x/week   PT DURATION: 10 weeks   PLANNED INTERVENTIONS: Therapeutic exercises, Therapeutic activity, Neuromuscular re-education, Balance training, Gait training, Patient/Family education, Dry Needling, Electrical stimulation, Cryotherapy, Moist heat, Taping, Ultrasound, and Manual therapy   PLAN FOR NEXT SESSION: Gentle ROM bilat to help with manage tone; A/ROM to AA/ROM to build strength.       1:35 PM, 08/03/22 Rosamaria Lints, PT, DPT Physical Therapist - [facility]  [phone] (ASCOM)   08/03/2022, 1:34 PM

## 2022-08-05 ENCOUNTER — Ambulatory Visit: Payer: 59 | Admitting: Physical Therapy

## 2022-08-05 ENCOUNTER — Encounter: Payer: Self-pay | Admitting: Physical Therapy

## 2022-08-05 DIAGNOSIS — R2689 Other abnormalities of gait and mobility: Secondary | ICD-10-CM | POA: Diagnosis not present

## 2022-08-05 DIAGNOSIS — M6281 Muscle weakness (generalized): Secondary | ICD-10-CM

## 2022-08-05 DIAGNOSIS — Z981 Arthrodesis status: Secondary | ICD-10-CM | POA: Diagnosis not present

## 2022-08-05 DIAGNOSIS — M79621 Pain in right upper arm: Secondary | ICD-10-CM | POA: Diagnosis not present

## 2022-08-05 DIAGNOSIS — R262 Difficulty in walking, not elsewhere classified: Secondary | ICD-10-CM
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
## 2022-08-10 ENCOUNTER — Ambulatory Visit: Payer: 59 | Admitting: Physical Therapy

## 2022-08-10 ENCOUNTER — Encounter: Payer: Self-pay | Admitting: Physical Therapy

## 2022-08-10 DIAGNOSIS — M6281 Muscle weakness (generalized): Secondary | ICD-10-CM | POA: Diagnosis not present

## 2022-08-10 DIAGNOSIS — M79621 Pain in right upper arm: Secondary | ICD-10-CM | POA: Diagnosis not present

## 2022-08-10 DIAGNOSIS — R262 Difficulty in walking, not elsewhere classified: Secondary | ICD-10-CM | POA: Diagnosis not present

## 2022-08-10 DIAGNOSIS — Z981 Arthrodesis status: Secondary | ICD-10-CM | POA: Diagnosis not present

## 2022-08-10 DIAGNOSIS — R2689 Other abnormalities of gait and mobility: Secondary | ICD-10-CM
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
## 2022-08-12 ENCOUNTER — Encounter: Payer: Self-pay | Admitting: Physical Therapy

## 2022-08-12 ENCOUNTER — Ambulatory Visit: Payer: 59 | Admitting: Physical Therapy

## 2022-08-12 DIAGNOSIS — R262 Difficulty in walking, not elsewhere classified: Secondary | ICD-10-CM | POA: Diagnosis not present

## 2022-08-12 DIAGNOSIS — Z981 Arthrodesis status: Secondary | ICD-10-CM

## 2022-08-12 DIAGNOSIS — M79621 Pain in right upper arm: Secondary | ICD-10-CM | POA: Diagnosis not present

## 2022-08-12 DIAGNOSIS — M6281 Muscle weakness (generalized): Secondary | ICD-10-CM

## 2022-08-12 DIAGNOSIS — R2689 Other abnormalities of gait and mobility: Secondary | ICD-10-CM | POA: Diagnosis not present

## 2022-08-12 NOTE — Therapy (Signed)
OUTPATIENT PHYSICAL THERAPY TREATMENT   Patient Name: Tom Barrett MRN: [id number] DOB:June 09, 1960, 62 y.o., male Today's Date: 08/12/2022  END OF SESSION:   PT End of Session - 08/12/22 0726     Visit Number 11    Number of Visits 21    Date for PT Re-Evaluation 09/17/22    Authorization Type [employer] 2024, 30 per year PT/OT/speech    Authorization Time Period 07/06/22-09/14/22    Progress Note Due on Visit 10    PT Start Time 0730    PT Stop Time 0814    PT Time Calculation (min) 44 min    Activity Tolerance Patient tolerated treatment well;Patient limited by pain    Behavior During Therapy [facility] for tasks assessed/performed              Past Medical History:  Diagnosis Date   Anemia    Arthritis    Carpal tunnel syndrome, bilateral    Complication of anesthesia    at 61 years old, he woke up being violent   Depression    Headache    eye migraine occasionally   Heberden's nodes    History of kidney stones    feels like he has passed a few stones   Hyperlipemia    Hypertension    Hypokalemia    Hypothyroidism    Malaise and fatigue    Sleep apnea    does not tolerate CPAP   Past Surgical History:  Procedure Laterality Date   CARPAL TUNNEL RELEASE Right 06/13/2020   Procedure: CARPAL TUNNEL RELEASE ENDOSCOPIC;  Surgeon: Christena Flake, MD;  Location: [facility] ORS;  Service: Orthopedics;  Laterality: Right;   CARPAL TUNNEL RELEASE Left 10/09/2020   Procedure: LEFT CARPAL TUNNEL RELEASE ENDOSCOPIC WITH SUBCUTANEOUS ANTERIOR TRANSPOSITION OF ULNER NERVE AT LEFT ELBOW.;  Surgeon: Christena Flake, MD;  Location: [facility];  Service: Orthopedics;  Laterality: Left;   CIRCUMCISION     as a teenager    COLONOSCOPY WITH PROPOFOL N/A 10/29/2014   Procedure: COLONOSCOPY WITH PROPOFOL;  Surgeon: Elnita Maxwell, MD;  Location: [facility] ENDOSCOPY;  Service: Endoscopy;  Laterality: N/A;   POSTERIOR CERVICAL LAMINECTOMY  12/23/2020   Procedure: POSTERIOR CERVICAL LAMINECTOMY C3-6;   Surgeon: Lucy Chris, MD;  Location: [facility] ORS;  Service: Neurosurgery;;   TOTAL KNEE ARTHROPLASTY Right 11/09/2017   Procedure: TOTAL KNEE ARTHROPLASTY;  Surgeon: Christena Flake, MD;  Location: [facility] OR[facility];  Service: Orthopedics;  Laterality: Right;   TOTAL KNEE ARTHROPLASTY Left 10/10/2019   Procedure: TOTAL KNEE ARTHROPLASTY;  Surgeon: Christena Flake, MD;  Location: [facility] ORS;  Service: Orthopedics;  Laterality: Left;   Patient Active Problem List   Diagnosis Date Noted   Cervical stenosis of spinal canal 12/23/2020   Primary osteoarthritis of left knee 04/01/2018   Status post total knee replacement using cement, right 11/09/2017    PCP: Olena Leatherwood, FNP  REFERRING PROVIDER: Douglass Rivers, PA*   REFERRING DIAG:  Z98.1 (ICD-10-CM) - S/P cervical spinal fusion   THERAPY DIAG:  Pain in right upper arm  Muscle weakness (generalized)  S/P cervical spinal fusion  Difficulty in walking, not elsewhere classified  Imbalance  Rationale for Evaluation and Treatment Rehabilitation  PERTINENT HISTORY: Pt is a 62 year old male s/p C4-7 ACDF 04/13/22; Hx of previous posterior cervical spine laminectomy C3-6; Hx of T6-8 aminoplasty in November 2023. 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 He reports immediate improvement in his R arm. He reports notable weakness with R arm; he  reports ruling out RTC injury. Pt feels that home health therapy did help. Patient reports minimal strength in L hand. Pt reports some residual numbness affecting L>RUE. Patient reports history of forearm pain along extensors in each forearm that has improved. Pt reports not having major pain at arrival. He reports sensation of fullness in his hands. He reports mainly having numbness and weakness. Pt denies hypersensitivity or pain with light touch along L arm region. Pt using gabapentin for symptom management.    Pain:  Pain Intensity: Present: 0/10, Best: 0/10, Worst: 6/10 Pain location: L upper arm along distal 1/3  laterally Pain Quality:  numbness, sharp pain along L arm    Radiating: No, symptoms are localized along R arm  Numbness/Tingling: Yes,sensory loss, no paresthesias Focal Weakness: Yes, L shoulder L>R hand  Aggravating factors: neck stretches given from hospital Relieving factors: nothing seems to help arms, Gabapentin 24-hour pain behavior: AM is worst  History of prior neck injury, pain, surgery, or therapy: Yes; complex surgical history (see above)  Falls: Has patient fallen in last 6 months? No, Number of falls: N/A Follow-up appointment with MD: Yes, this Wednesday 07/08/22 Dominant hand: right Imaging: Yes ;   Post-op radiographs negative for any acute abnormalities/hardware issues     Prior level of function: Independent with community mobility with device; pt uses walking stick outside; IND with self-care/hygiene  Occupational demands: Pt out of work from previous job  Hobbies: fishing, able to play with grandchildren (4 y/o, 2y/o, 1 y/o)   Red flags (personal history of cancer, h/o spinal tumors, history of compression fracture, chills/fever, night sweats, nausea, vomiting, unrelenting pain): Negative   Weight Bearing Restrictions: No   Living Environment Lives with: lives with their spouse; wife is able to help patient at home. Pt is alone during the day when wife is at work. Grab bars in bathroom. Built-in bench. Pt has non-skid mat. 6 stairs to get in home with rails on both side. Flat terrain to get into home - sidewalk up to his front door  Lives in: House/apartment     Patient Goals:  More use of his hands and arms; would like to walk better       PRECAUTIONS: No lifting > 20 lbs, impaired gait/fall risk     SUBJECTIVE:                                                                                                                                                                                      SUBJECTIVE STATEMENT:  Patient reports having usual  paresthesias/numbness in L>R upper limb. Patient reports feeling good about his progress and states he has improved  compared to his baseline with gait. He reports pulling muscle on R side of his low back with donning his belt and rotating trunk to get to his buckle.   PAIN:  Are you having pain? Yes, heaviness and numbness in upper limbs Pain location: Upper arms, hands    OBJECTIVE: (objective measures completed at initial evaluation unless otherwise dated)   Patient Surveys  FOTO 45, predicted outcome score of 53   Cognition Patient is oriented to person, place, and time.  Recent memory is intact.  Remote memory is intact.  Attention span and concentration are intact.  Expressive speech is intact.  Patient's fund of knowledge is within normal limits for educational level.                          Gross Musculoskeletal Assessment Tremor: None Bulk: Normal Tone: Normal     Gait Abductor lurch toward his L side, decreased TKE at terminal swing, dec arm swing and stiff trunk. Overpronation of bilat feet.    Posture Guarded posture, rounded shoulders      AROM AROM (Normal range in degrees) AROM 07/08/2022  Cervical  Flexion (50) 25  Extension (80) 30  Right lateral flexion (45) 28  Left lateral flexion (45) 23  Right rotation (85) 42  Left rotation (85) 33  (* = pain; Blank rows = not tested)     Shoulder AROM Shoulder flexion: R 144, L 30 Shoulder abduction: R 131, L 38  Shoulder ER: R WNL, L 47   08/10/22 Shoulder flexion: R 166, L 58 Shoulder abduction: R 151, L 55  Shoulder ER: R WNL, L 61     MMT MMT (out of 5) Right 07/08/2022 Left 07/08/2022 Right 08/10/22 Left 08/10/22  Cervical (isometric)    Flexion WNL    Extension WNL    Lateral Flexion WNL WNL    Rotation WNL WNL             Shoulder     Flexion 4 2- 4 2-  Extension        Abduction 4 2- 4 2-  Internal rotation 4 4 4  4+  External rotation  4 3+ 4 3+  Horizontal abduction         Horizontal adduction        Lower Trapezius        Rhomboids                 Elbow    Flexion 4 3+ 4+ 4  Extension 4 4 4+ 4  Pronation        Supination                 (* = pain; Blank rows = not tested)     LE MMT Hip flexion: R 4+, L 4- Quad: R 5-, L 4+ Ankle DF: R 4+, L 4+ Hip ABD: R 4, L 4     GRIP STRENGTH DYNAMOMETRY (07/09/22) Best of 3 trials R: 71.3,  L: 41.6   08/10/22 R: 63 lbs, L: 52.9 lbs     Sensation C7 and C8 loss of light touch LUE. Proprioception and hot/cold testing deferred on this date.   Reflexes R/L Elbow: 2+/2+  Brachioradialis: 2+/2+  Tricep: 2+/2+   Palpation No TTP along L arm/forearm     SPECIAL TESTS Hoffman Sign (cervical cord compression): R: Negative L: Negative ULTT Median: R: Not examined L: Not examined ULTT Ulnar: R: Not  examined L: Not examined ULTT Radial: R: Not examined L: Not examined      Clinical Test of Sensory Interaction for Balance    (CTSIB): Performed 07/09/22 CONDITION TIME STRATEGY SWAY  Eyes open, firm surface 30 seconds ankle   Eyes closed, firm surface 30 seconds ankle   Eyes open, foam surface 30 seconds ankle Increased  Eyes closed, foam surface 30 seconds Ankle, hip           Increased     Romberg: EO WNL, EC WNL (with increased sway)  DGI: 16/24  (07/09/22)      TODAY'S TREATMENT     Therapeutic Exercise - for improved soft tissue flexibility and extensibility as needed for ROM, shoulder AROM and exercises to improve forward reaching and shoulder elevation     Nustep; x 5 minutes, Level 3 - for LE soft tissue mobility and warm-up as well as upper limb AAROM   Moist hot pack utilized along low back during NuStep for analgesic effect along low back; x 5 minutes   Standing wand shoulder flexion AAROM; 2x10, back to wall to maintain upright position   Shoulder flexion slide on incline (towel sliding on handrail with staircase in center of gym); x15   -2 incidents of L hand slipping off of  edge of rail   Tband high row for forward reaching/shoulder elevation with band anchored at top of door; Red Tband; 2x10    PATIENT EDUCATION: Discussed current progress overall and unchanged DGI score noted today. We discussed likely progress expected with continued PT given modest progress obtained to date    *not today*  Wand shoulder flexion/abduction AAROM in supine; 2x10    Ball across table, into abduction, Silver physioball; 2x10 bilat UE Ball up wall, pt standing at wall; Silver physioball; 1x15 bilat UE on ball together     Neuromuscular Re-education - for improved sensory integration, static and dynamic postural control, equilibrium and non-equilibrium coordination as needed for negotiating home and community environment and stepping over obstacles   *DGI performed   Hurdle stepping; 3 6-inch hurdles, forward stepping; 1x D/B with reciprocal pattern (poor hurdle clearance and difficulty targeting with limited single-limb support time)  -performed 4x D/B with step-to pattern with sound clearance  Lateral hurdle stepping; 3 6-inch hurdles; 3x D/B   Alternating foot taps on 12" step without UE support: 2x10/LE    PATIENT EDUCATION:  Education details: Plan of care Person educated: Patient Education method: Explanation Education comprehension: verbalized understanding     HOME EXERCISE PROGRAM: Access Code: [email or web address] URL: [email or web address] Date: 07/13/2022 Prepared by: Consuela Mimes  Exercises - Sit to Stand  - 2 x daily - 7 x weekly - 2 sets - 10 reps - Putty Squeezes  - 2 x daily - 7 x weekly - 1-2 sets - 2-3 min hold - Supine Shoulder Flexion with Dowel  - 2 x daily - 7 x weekly - 2 sets - 10 reps - Supine Shoulder External Rotation in 45 Degrees Abduction AAROM with Dowel  - 2 x daily - 7 x weekly - 2 sets - 10 reps - Seated Shoulder Flexion AAROM with Pulley Behind  - 2 x daily - 7 x weekly - 2-3 mins hold     ASSESSMENT:   CLINICAL  IMPRESSION:  Patient has unchanged DGI score compared to baseline measure. 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 We are continuing to work on dynamic balance and stepping tasks  versus static postural control work. Pt is notably limited with elevating L shoulder > 90 deg. AROM is slowly improving per re-measurements taken last visit and as demonstrated with progression of exercise in clinic. Patient has remaining deficits in L>R shoulder AROM, L>R upper limb strength, postural changes, postural control during gait/dynamic balance, dec neural/dural mobility, postural changes, and pain/L>R upper limb paresthesias. Patient will benefit from continued skilled therapeutic intervention to address the above deficits as needed for improved function and QoL.    REHAB POTENTIAL: Fair given complicated musculoskeletal history with multiple spinal surgeries, bilateral upper limb involvement   CLINICAL DECISION MAKING: Unstable/unpredictable   EVALUATION COMPLEXITY: High     GOALS: Goals reviewed with patient? Yes   SHORT TERM GOALS: Target date: 07/29/2022   Pt will be independent with HEP to improve strength and decrease neck pain to improve pain-free function at home and work. Baseline: 07/06/22: HEP to be initiated and reviewed next visit.    08/10/22: Compliant with HEP  Goal status: ACHIEVED     LONG TERM GOALS: Target date: 09/17/2022   Pt will increase FOTO to at least 57 to demonstrate significant improvement in function at home and work related to neck pain  Baseline: 07/06/22: 48    08/10/22: 47/57 Goal status: NOT MET    2.  Patient will have active R shoulder flexion and abduction to 130 deg or greater as needed for functional reaching, self-care activities, household work Baseline: 07/06/22: R shoulder AROM flexion 30, ABD 38.    08/10/22:  R shoulder AROM flexion 58, ABD 55.  Goal status: ON-GOING   3.  Pt will have grip strength (tested  by dynamometer) within standard error of age-based/gender-based norm (R hand 88.2 lbs, L hand 83.78 lbs) indicative of improved ability to perform gripping/prehensile activities with each upper limb  Baseline: 07/06/22: Grip strength baseline to be formally tested next visit.   07/09/22: R: 71.3,  L: 41.6     08/10/22: R: 63 lbs, L: 52.9 lbs  Goal status: ON-GOING   4.  Pt will score DGI > or equal to 19 indicative of no increased risk of falling  Baseline: 07/06/22: Baseline to be obtained on visit # 3.  07/13/22: 16/24.    08/10/22: Deferred to next visit.    08/12/22: 16/24 Goal status: DEFERRED   5.  Pt will improve bilateral upper limb MMTs to 4+/5 indicative of improved strength as needed for moving items, carrying, lifting within post-op restrictions Baseline: 07/06/22: RUE strength grossly 4/5, LUE 2- to 4.    08/10/22: Improved R elbow MMT and L shoulder IR MMT, otherwise unchanged.   Goal status: NOT MET     PLAN: PT FREQUENCY: 2x/week   PT DURATION: 6-8 weeks   PLANNED INTERVENTIONS: Therapeutic exercises, Therapeutic activity, Neuromuscular re-education, Balance training, Gait training, Patient/Family education, Dry Needling, Electrical stimulation, Cryotherapy, Moist heat, Taping, Ultrasound, and Manual therapy   PLAN FOR NEXT SESSION: Continue with work on shoulder/UE ROM and consider use of e-stim/NMES for improved muscle activation. Work on postural control and weightshifting drills as well as HEP for LE strengthening. Update HEP next visit.    Consuela Mimes, PT, DPT [phone]  08/12/2022, 10:13 AM

## 2022-08-17 ENCOUNTER — Ambulatory Visit: Payer: 59 | Admitting: Physical Therapy

## 2022-08-17 ENCOUNTER — Encounter: Payer: Self-pay | Admitting: Physical Therapy

## 2022-08-17 DIAGNOSIS — R2689 Other abnormalities of gait and mobility: Secondary | ICD-10-CM | POA: Diagnosis not present

## 2022-08-17 DIAGNOSIS — M79621 Pain in right upper arm: Secondary | ICD-10-CM | POA: Diagnosis not present

## 2022-08-17 DIAGNOSIS — Z981 Arthrodesis status: Secondary | ICD-10-CM

## 2022-08-17 DIAGNOSIS — M6281 Muscle weakness (generalized): Secondary | ICD-10-CM

## 2022-08-17 DIAGNOSIS — R262 Difficulty in walking, not elsewhere classified: Secondary | ICD-10-CM

## 2022-08-17 NOTE — Therapy (Signed)
OUTPATIENT PHYSICAL THERAPY TREATMENT   Patient Name: Tom Barrett MRN: [id number] DOB:1961/01/23, 62 y.o., male Today's Date: 08/17/2022  END OF SESSION:   PT End of Session - 08/17/22 1240     Visit Number 12    Number of Visits 21    Date for PT Re-Evaluation 09/17/22    Authorization Type [employer] 2024, 30 per year PT/OT/speech    Authorization Time Period 07/06/22-09/14/22    Progress Note Due on Visit 10    PT Start Time 1242    PT Stop Time 1326    PT Time Calculation (min) 44 min    Activity Tolerance Patient tolerated treatment well;Patient limited by pain    Behavior During Therapy WFL for tasks assessed/performed             Past Medical History:  Diagnosis Date   Anemia    Arthritis    Carpal tunnel syndrome, bilateral    Complication of anesthesia    at 62 years old, he woke up being violent   Depression    Headache    eye migraine occasionally   Heberden's nodes    History of kidney stones    feels like he has passed a few stones   Hyperlipemia    Hypertension    Hypokalemia    Hypothyroidism    Malaise and fatigue    Sleep apnea    does not tolerate CPAP   Past Surgical History:  Procedure Laterality Date   CARPAL TUNNEL RELEASE Right 06/13/2020   Procedure: CARPAL TUNNEL RELEASE ENDOSCOPIC;  Surgeon: Christena Flake, MD;  Location: [facility] ORS;  Service: Orthopedics;  Laterality: Right;   CARPAL TUNNEL RELEASE Left 10/09/2020   Procedure: LEFT CARPAL TUNNEL RELEASE ENDOSCOPIC WITH SUBCUTANEOUS ANTERIOR TRANSPOSITION OF ULNER NERVE AT LEFT ELBOW.;  Surgeon: Christena Flake, MD;  Location: [facility];  Service: Orthopedics;  Laterality: Left;   CIRCUMCISION     as a teenager    COLONOSCOPY WITH PROPOFOL N/A 10/29/2014   Procedure: COLONOSCOPY WITH PROPOFOL;  Surgeon: Elnita Maxwell, MD;  Location: [facility] ENDOSCOPY;  Service: Endoscopy;  Laterality: N/A;   POSTERIOR CERVICAL LAMINECTOMY  12/23/2020   Procedure: POSTERIOR CERVICAL LAMINECTOMY C3-6;   Surgeon: Lucy Chris, MD;  Location: [facility] ORS;  Service: Neurosurgery;;   TOTAL KNEE ARTHROPLASTY Right 11/09/2017   Procedure: TOTAL KNEE ARTHROPLASTY;  Surgeon: Christena Flake, MD;  Location: [facility];  Service: Orthopedics;  Laterality: Right;   TOTAL KNEE ARTHROPLASTY Left 10/10/2019   Procedure: TOTAL KNEE ARTHROPLASTY;  Surgeon: Christena Flake, MD;  Location: [facility] ORS;  Service: Orthopedics;  Laterality: Left;   Patient Active Problem List   Diagnosis Date Noted   Cervical stenosis of spinal canal 12/23/2020   Primary osteoarthritis of left knee 04/01/2018   Status post total knee replacement using cement, right 11/09/2017    PCP: Olena Leatherwood, FNP  REFERRING PROVIDER: Douglass Rivers, PA*   REFERRING DIAG:  Z98.1 (ICD-10-CM) - S/P cervical spinal fusion   THERAPY DIAG:  Pain in right upper arm  Muscle weakness (generalized)  S/P cervical spinal fusion  Difficulty in walking, not elsewhere classified  Imbalance  Rationale for Evaluation and Treatment Rehabilitation  PERTINENT HISTORY: Pt is a 61 year old male s/p C4-7 ACDF 04/13/22; Hx of previous posterior cervical spine laminectomy C3-6; Hx of T6-8 aminoplasty in November 2023. 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 He reports immediate improvement in his R arm. He reports notable weakness with R arm; he reports  ruling out RTC injury. Pt feels that home health therapy did help. Patient reports minimal strength in L hand. Pt reports some residual numbness affecting L>RUE. Patient reports history of forearm pain along extensors in each forearm that has improved. Pt reports not having major pain at arrival. He reports sensation of fullness in his hands. He reports mainly having numbness and weakness. Pt denies hypersensitivity or pain with light touch along L arm region. Pt using gabapentin for symptom management.    Pain:  Pain Intensity: Present: 0/10, Best: 0/10, Worst: 6/10 Pain location: L upper arm along distal 1/3  laterally Pain Quality:  numbness, sharp pain along L arm    Radiating: No, symptoms are localized along R arm  Numbness/Tingling: Yes,sensory loss, no paresthesias Focal Weakness: Yes, L shoulder L>R hand  Aggravating factors: neck stretches given from hospital Relieving factors: nothing seems to help arms, Gabapentin 24-hour pain behavior: AM is worst  History of prior neck injury, pain, surgery, or therapy: Yes; complex surgical history (see above)  Falls: Has patient fallen in last 6 months? No, Number of falls: N/A Follow-up appointment with MD: Yes, this Wednesday 07/08/22 Dominant hand: right Imaging: Yes ;   Post-op radiographs negative for any acute abnormalities/hardware issues     Prior level of function: Independent with community mobility with device; pt uses walking stick outside; IND with self-care/hygiene  Occupational demands: Pt out of work from previous job  Hobbies: fishing, able to play with grandchildren (4 y/o, 2y/o, 1 y/o)   Red flags (personal history of cancer, h/o spinal tumors, history of compression fracture, chills/fever, night sweats, nausea, vomiting, unrelenting pain): Negative   Weight Bearing Restrictions: No   Living Environment Lives with: lives with their spouse; wife is able to help patient at home. Pt is alone during the day when wife is at work. Grab bars in bathroom. Built-in bench. Pt has non-skid mat. 6 stairs to get in home with rails on both side. Flat terrain to get into home - sidewalk up to his front door  Lives in: House/apartment     Patient Goals:  More use of his hands and arms; would like to walk better       PRECAUTIONS: No lifting > 20 lbs, impaired gait/fall risk     SUBJECTIVE:                                                                                                                                                                                      SUBJECTIVE STATEMENT:  Patient reports having significant  mid-thoracic pain following weeding eating and spraying weed killer in his yard. He reports notable pain with sitting  on picnic table the following day. Patient reports ongoing paresthesias in his upper limbs that did not stop him from completing work. Pt has upped his Gabapentin to previous level; 300mg  4x/day. Patient reports compliance with his HEP typically, but he states he was in bed Saturday-Sunday due to hurting.   PAIN:  Are you having pain? Yes, heaviness and numbness in upper limbs Pain location: Upper arms, hands    OBJECTIVE: (objective measures completed at initial evaluation unless otherwise dated)   Patient Surveys  FOTO 45, predicted outcome score of 81   Cognition Patient is oriented to person, place, and time.  Recent memory is intact.  Remote memory is intact.  Attention span and concentration are intact.  Expressive speech is intact.  Patient's fund of knowledge is within normal limits for educational level.                          Gross Musculoskeletal Assessment Tremor: None Bulk: Normal Tone: Normal     Gait Abductor lurch toward his L side, decreased TKE at terminal swing, dec arm swing and stiff trunk. Overpronation of bilat feet.    Posture Guarded posture, rounded shoulders      AROM AROM (Normal range in degrees) AROM 07/08/2022  Cervical  Flexion (50) 25  Extension (80) 30  Right lateral flexion (45) 28  Left lateral flexion (45) 23  Right rotation (85) 42  Left rotation (85) 33  (* = pain; Blank rows = not tested)     Shoulder AROM Shoulder flexion: R 144, L 30 Shoulder abduction: R 131, L 38  Shoulder ER: R WNL, L 47   08/10/22 Shoulder flexion: R 166, L 58 Shoulder abduction: R 151, L 55  Shoulder ER: R WNL, L 61     MMT MMT (out of 5) Right 07/08/2022 Left 07/08/2022 Right 08/10/22 Left 08/10/22  Cervical (isometric)    Flexion WNL    Extension WNL    Lateral Flexion WNL WNL    Rotation WNL WNL             Shoulder      Flexion 4 2- 4 2-  Extension        Abduction 4 2- 4 2-  Internal rotation 4 4 4  4+  External rotation  4 3+ 4 3+  Horizontal abduction        Horizontal adduction        Lower Trapezius        Rhomboids                 Elbow    Flexion 4 3+ 4+ 4  Extension 4 4 4+ 4  Pronation        Supination                 (* = pain; Blank rows = not tested)     LE MMT Hip flexion: R 4+, L 4- Quad: R 5-, L 4+ Ankle DF: R 4+, L 4+ Hip ABD: R 4, L 4     GRIP STRENGTH DYNAMOMETRY (07/09/22) Best of 3 trials R: 71.3,  L: 41.6   08/10/22 R: 63 lbs, L: 52.9 lbs     Sensation C7 and C8 loss of light touch LUE. Proprioception and hot/cold testing deferred on this date.   Reflexes R/L Elbow: 2+/2+  Brachioradialis: 2+/2+  Tricep: 2+/2+   Palpation No TTP along L arm/forearm     SPECIAL TESTS  Hoffman Sign (cervical cord compression): R: Negative L: Negative ULTT Median: R: Not examined L: Not examined ULTT Ulnar: R: Not examined L: Not examined ULTT Radial: R: Not examined L: Not examined      Clinical Test of Sensory Interaction for Balance    (CTSIB): Performed 07/09/22 CONDITION TIME STRATEGY SWAY  Eyes open, firm surface 30 seconds ankle   Eyes closed, firm surface 30 seconds ankle   Eyes open, foam surface 30 seconds ankle Increased  Eyes closed, foam surface 30 seconds Ankle, hip           Increased     Romberg: EO WNL, EC WNL (with increased sway)  DGI: 16/24  (07/09/22)      TODAY'S TREATMENT     Therapeutic Exercise - for improved soft tissue flexibility and extensibility as needed for ROM, shoulder AROM and exercises to improve forward reaching and shoulder elevation     Nustep; x 5 minutes, Level 3 - for LE soft tissue mobility and warm-up as well as upper limb AAROM   Standing wand shoulder flexion AAROM; 2x10, back to wall to maintain upright position   Shoulder flexion slide on incline (towel sliding on handrail with staircase in center of gym);  x15   -2 incidents of L hand slipping off of edge of rail   Tband high row for forward reaching/shoulder elevation with band anchored at top of door; Red Tband; 2x10    PATIENT EDUCATION: Discussed current progress overall and unchanged DGI score noted today. We discussed likely progress expected with continued PT given modest progress obtained to date    *not today*  Wand shoulder flexion/abduction AAROM in supine; 2x10    Ball across table, into abduction, Silver physioball; 2x10 bilat UE Ball up wall, pt standing at wall; Silver physioball; 1x15 bilat UE on ball together     Neuromuscular Re-education - for improved sensory integration, static and dynamic postural control, equilibrium and non-equilibrium coordination as needed for negotiating home and community environment and stepping over obstacles    Hurdle stepping; 3 6-inch hurdles, forward stepping; 1x D/B with reciprocal pattern (poor hurdle clearance and difficulty targeting with limited single-limb support time)  -performed 4x D/B with step-to pattern with sound clearance  Lateral hurdle stepping; 3 6-inch hurdles; 3x D/B   Alternating foot taps on 12" step without UE support: 2x10/LE    PATIENT EDUCATION:  Education details: Plan of care Person educated: Patient Education method: Explanation Education comprehension: verbalized understanding     HOME EXERCISE PROGRAM: Access Code: [email or web address] URL: [email or web address] Date: 07/13/2022 Prepared by: Consuela Mimes  Exercises - Sit to Stand  - 2 x daily - 7 x weekly - 2 sets - 10 reps - Putty Squeezes  - 2 x daily - 7 x weekly - 1-2 sets - 2-3 min hold - Supine Shoulder Flexion with Dowel  - 2 x daily - 7 x weekly - 2 sets - 10 reps - Supine Shoulder External Rotation in 45 Degrees Abduction AAROM with Dowel  - 2 x daily - 7 x weekly - 2 sets - 10 reps - Seated Shoulder Flexion AAROM with Pulley Behind  - 2 x daily - 7 x weekly - 2-3 mins hold      ASSESSMENT:   CLINICAL IMPRESSION:  Patient has unchanged DGI score compared to baseline measure. 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 We are continuing to work on dynamic balance and stepping tasks versus static  postural control work. Pt is notably limited with elevating L shoulder > 90 deg. AROM is slowly improving per re-measurements taken last visit and as demonstrated with progression of exercise in clinic. Patient has remaining deficits in L>R shoulder AROM, L>R upper limb strength, postural changes, postural control during gait/dynamic balance, dec neural/dural mobility, postural changes, and pain/L>R upper limb paresthesias. Patient will benefit from continued skilled therapeutic intervention to address the above deficits as needed for improved function and QoL.    REHAB POTENTIAL: Fair given complicated musculoskeletal history with multiple spinal surgeries, bilateral upper limb involvement   CLINICAL DECISION MAKING: Unstable/unpredictable   EVALUATION COMPLEXITY: High     GOALS: Goals reviewed with patient? Yes   SHORT TERM GOALS: Target date: 07/29/2022   Pt will be independent with HEP to improve strength and decrease neck pain to improve pain-free function at home and work. Baseline: 07/06/22: HEP to be initiated and reviewed next visit.    08/10/22: Compliant with HEP  Goal status: ACHIEVED     LONG TERM GOALS: Target date: 09/17/2022   Pt will increase FOTO to at least 57 to demonstrate significant improvement in function at home and work related to neck pain  Baseline: 07/06/22: 48    08/10/22: 47/57 Goal status: NOT MET    2.  Patient will have active R shoulder flexion and abduction to 130 deg or greater as needed for functional reaching, self-care activities, household work Baseline: 07/06/22: R shoulder AROM flexion 30, ABD 38.    08/10/22:  R shoulder AROM flexion 58, ABD 55.  Goal status: ON-GOING   3.  Pt will  have grip strength (tested by dynamometer) within standard error of age-based/gender-based norm (R hand 88.2 lbs, L hand 83.78 lbs) indicative of improved ability to perform gripping/prehensile activities with each upper limb  Baseline: 07/06/22: Grip strength baseline to be formally tested next visit.   07/09/22: R: 71.3,  L: 41.6     08/10/22: R: 63 lbs, L: 52.9 lbs  Goal status: ON-GOING   4.  Pt will score DGI > or equal to 19 indicative of no increased risk of falling  Baseline: 07/06/22: Baseline to be obtained on visit # 3.  07/13/22: 16/24.    08/10/22: Deferred to next visit.    08/12/22: 16/24 Goal status: DEFERRED   5.  Pt will improve bilateral upper limb MMTs to 4+/5 indicative of improved strength as needed for moving items, carrying, lifting within post-op restrictions Baseline: 07/06/22: RUE strength grossly 4/5, LUE 2- to 4.    08/10/22: Improved R elbow MMT and L shoulder IR MMT, otherwise unchanged.   Goal status: NOT MET     PLAN: PT FREQUENCY: 2x/week   PT DURATION: 6-8 weeks   PLANNED INTERVENTIONS: Therapeutic exercises, Therapeutic activity, Neuromuscular re-education, Balance training, Gait training, Patient/Family education, Dry Needling, Electrical stimulation, Cryotherapy, Moist heat, Taping, Ultrasound, and Manual therapy   PLAN FOR NEXT SESSION: Continue with work on shoulder/UE ROM and consider use of e-stim/NMES for improved muscle activation. Work on postural control and weightshifting drills as well as HEP for LE strengthening. Update HEP next visit.    Consuela Mimes, PT, DPT [phone]  08/17/2022, 12:41 PM

## 2022-08-19 ENCOUNTER — Ambulatory Visit: Payer: 59

## 2022-08-19 DIAGNOSIS — M6281 Muscle weakness (generalized): Secondary | ICD-10-CM

## 2022-08-19 DIAGNOSIS — Z981 Arthrodesis status: Secondary | ICD-10-CM | POA: Diagnosis not present

## 2022-08-19 DIAGNOSIS — R2689 Other abnormalities of gait and mobility: Secondary | ICD-10-CM

## 2022-08-19 DIAGNOSIS — R262 Difficulty in walking, not elsewhere classified: Secondary | ICD-10-CM | POA: Diagnosis not present

## 2022-08-19 DIAGNOSIS — M79621 Pain in right upper arm: Secondary | ICD-10-CM

## 2022-08-19 NOTE — Therapy (Signed)
OUTPATIENT PHYSICAL THERAPY TREATMENT   Patient Name: Tom Barrett MRN: [id number] DOB:1960-11-07, 62 y.o., male Today's Date: 08/19/2022  END OF SESSION:   PT End of Session - 08/19/22 1247     Visit Number 13    Number of Visits 21    Date for PT Re-Evaluation 09/17/22    Authorization Type [employer] 2024, 30 per year PT/OT/speech    Authorization Time Period 07/06/22-09/14/22    Progress Note Due on Visit 14    PT Start Time 1245    PT Stop Time 1325    PT Time Calculation (min) 40 min    Activity Tolerance Patient tolerated treatment well;Patient limited by pain    Behavior During Therapy WFL for tasks assessed/performed             Past Medical History:  Diagnosis Date   Anemia    Arthritis    Carpal tunnel syndrome, bilateral    Complication of anesthesia    at 62 years old, he woke up being violent   Depression    Headache    eye migraine occasionally   Heberden's nodes    History of kidney stones    feels like he has passed a few stones   Hyperlipemia    Hypertension    Hypokalemia    Hypothyroidism    Malaise and fatigue    Sleep apnea    does not tolerate CPAP   Past Surgical History:  Procedure Laterality Date   CARPAL TUNNEL RELEASE Right 06/13/2020   Procedure: CARPAL TUNNEL RELEASE ENDOSCOPIC;  Surgeon: Christena Flake, MD;  Location: [facility] ORS;  Service: Orthopedics;  Laterality: Right;   CARPAL TUNNEL RELEASE Left 10/09/2020   Procedure: LEFT CARPAL TUNNEL RELEASE ENDOSCOPIC WITH SUBCUTANEOUS ANTERIOR TRANSPOSITION OF ULNER NERVE AT LEFT ELBOW.;  Surgeon: Christena Flake, MD;  Location: [facility] OR[facility];  Service: Orthopedics;  Laterality: Left;   CIRCUMCISION     as a teenager    COLONOSCOPY WITH PROPOFOL N/A 10/29/2014   Procedure: COLONOSCOPY WITH PROPOFOL;  Surgeon: Elnita Maxwell, MD;  Location: [facility] ENDOSCOPY;  Service: Endoscopy;  Laterality: N/A;   POSTERIOR CERVICAL LAMINECTOMY  12/23/2020   Procedure: POSTERIOR CERVICAL LAMINECTOMY C3-6;   Surgeon: Lucy Chris, MD;  Location: [facility];  Service: Neurosurgery;;   TOTAL KNEE ARTHROPLASTY Right 11/09/2017   Procedure: TOTAL KNEE ARTHROPLASTY;  Surgeon: Christena Flake, MD;  Location: [facility];  Service: Orthopedics;  Laterality: Right;   TOTAL KNEE ARTHROPLASTY Left 10/10/2019   Procedure: TOTAL KNEE ARTHROPLASTY;  Surgeon: Christena Flake, MD;  Location: [facility] ORS;  Service: Orthopedics;  Laterality: Left;   Patient Active Problem List   Diagnosis Date Noted   Cervical stenosis of spinal canal 12/23/2020   Primary osteoarthritis of left knee 04/01/2018   Status post total knee replacement using cement, right 11/09/2017    PCP: Olena Leatherwood, FNP  REFERRING PROVIDER: Douglass Rivers, PA*   REFERRING DIAG:  Z98.1 (ICD-10-CM) - S/P cervical spinal fusion   THERAPY DIAG:  Pain in right upper arm  Muscle weakness (generalized)  S/P cervical spinal fusion  Difficulty in walking, not elsewhere classified  Imbalance  Rationale for Evaluation and Treatment Rehabilitation  PERTINENT HISTORY: Pt is a 62 year old male s/p C4-7 ACDF 04/13/22; Hx of previous posterior cervical spine laminectomy C3-6; Hx of T6-8 aminoplasty in November 2023. 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 He reports immediate improvement in his R arm. He reports notable weakness with R arm; he reports  ruling out RTC injury. Pt feels that home health therapy did help. Patient reports minimal strength in L hand. Pt reports some residual numbness affecting L>RUE. Patient reports history of forearm pain along extensors in each forearm that has improved. Pt reports not having major pain at arrival. He reports sensation of fullness in his hands. He reports mainly having numbness and weakness. Pt denies hypersensitivity or pain with light touch along L arm region. Pt using gabapentin for symptom management.       Patient Goals:  More use of his hands and arms; would like to walk better       PRECAUTIONS: No  lifting > 20 lbs, impaired gait/fall risk  SUBJECTIVE:                                                                                                                                                                                      SUBJECTIVE STATEMENT:  Woke up with worse balance and tightness in arm today. Still on higher meds dose for pain.   PAIN:  Are you having pain? 4/10  Pain location: Upper arms, hands   OBJECTIVE:     TODAY'S TREATMENT 08/19/22   -Nustep; x 5 minutes, Level 3, AAROM  -Standing wand shoulder flexion AAROM; 1x10, back to wall to maintain upright position  -Standing LLLD shoulder flexion stretch with 2 hand grip on // bar and posterior hip hinge, wider second time   -Standing wand shoulder flexion AAROM; 1x10, back to wall to maintain upright position  -Standing LLLD ABDCT stretch 1x45sec  -Left arm AA/ROM ABDCT x8   -Standing LLLD ABDCT stretch 1x45sec   -standing toe taps x20, 6"m then knee taps, then step taps from airex foam    -walking course with 2 airex pad step up/downs, step overs x2   -standing wand shoulder flexion AAROM; 1x10, back to wall to maintain upright position on treadmill this time  -stair railing slides 1x25 LUE on towel 1secH    PATIENT EDUCATION:  Education details: Plan of care Person educated: Patient Education method: Explanation Education comprehension: verbalized understanding     HOME EXERCISE PROGRAM: Access Code: [email or web address] URL: [email or web address] Date: 07/13/2022 Prepared by: Consuela Mimes  Exercises - Sit to Stand  - 2 x daily - 7 x weekly - 2 sets - 10 reps - Putty Squeezes  - 2 x daily - 7 x weekly - 1-2 sets - 2-3 min hold - Supine Shoulder Flexion with Dowel  - 2 x daily - 7 x weekly - 2 sets - 10 reps - Supine Shoulder External Rotation in 45 Degrees Abduction AAROM with Dowel  - 2 x daily - 7  x weekly - 2 sets - 10 reps - Seated Shoulder Flexion AAROM with Pulley Behind  - 2 x daily -  7 x weekly - 2-3 mins hold - Scapular retractions - 2 x daily - 7 x weekly - 2 sets - 10 reps      ASSESSMENT:   CLINICAL IMPRESSION: Pain remains elevated in arm, balance acutely worse than previous recent days. Pt remains motivated, gives a great effort despite high pain levels. Patient will benefit from continued skilled therapeutic intervention to address the above deficits as needed for improved function and QoL.    REHAB POTENTIAL: Fair given complicated musculoskeletal history with multiple spinal surgeries, bilateral upper limb involvement   CLINICAL DECISION MAKING: Unstable/unpredictable   EVALUATION COMPLEXITY: High     GOALS: Goals reviewed with patient? Yes   SHORT TERM GOALS: Target date: 07/29/2022   Pt will be independent with HEP to improve strength and decrease neck pain to improve pain-free function at home and work. Baseline: 07/06/22: HEP to be initiated and reviewed next visit.    08/10/22: Compliant with HEP  Goal status: ACHIEVED     LONG TERM GOALS: Target date: 09/17/2022   Pt will increase FOTO to at least 57 to demonstrate significant improvement in function at home and work related to neck pain  Baseline: 07/06/22: 48    08/10/22: 47/57 Goal status: NOT MET    2.  Patient will have active R shoulder flexion and abduction to 130 deg or greater as needed for functional reaching, self-care activities, household work Baseline: 07/06/22: R shoulder AROM flexion 30, ABD 38.    08/10/22:  R shoulder AROM flexion 58, ABD 55.  Goal status: ON-GOING   3.  Pt will have grip strength (tested by dynamometer) within standard error of age-based/gender-based norm (R hand 88.2 lbs, L hand 83.78 lbs) indicative of improved ability to perform gripping/prehensile activities with each upper limb  Baseline: 07/06/22: Grip strength baseline to be formally tested next visit.   07/09/22: R: 71.3,  L: 41.6     08/10/22: R: 63 lbs, L: 52.9 lbs  Goal status: ON-GOING   4.  Pt will score  DGI > or equal to 19 indicative of no increased risk of falling  Baseline: 07/06/22: Baseline to be obtained on visit # 3.  07/13/22: 16/24.    08/10/22: Deferred to next visit.    08/12/22: 16/24 Goal status: DEFERRED   5.  Pt will improve bilateral upper limb MMTs to 4+/5 indicative of improved strength as needed for moving items, carrying, lifting within post-op restrictions Baseline: 07/06/22: RUE strength grossly 4/5, LUE 2- to 4.    08/10/22: Improved R elbow MMT and L shoulder IR MMT, otherwise unchanged.   Goal status: NOT MET     PLAN: PT FREQUENCY: 2x/week   PT DURATION: 6-8 weeks   PLANNED INTERVENTIONS: Therapeutic exercises, Therapeutic activity, Neuromuscular re-education, Balance training, Gait training, Patient/Family education, Dry Needling, Electrical stimulation, Cryotherapy, Moist heat, Taping, Ultrasound, and Manual therapy   PLAN FOR NEXT SESSION: Continue with work on shoulder/UE ROM and consider use of e-stim/NMES for improved muscle activation. Work on postural control and weightshifting drills as well as HEP for LE strengthening.  12:49 PM, 08/19/22 Rosamaria Lints, PT, DPT Physical Therapist - [facility] in [place]  [phone] (Office)    08/19/2022, 12:49 PM

## 2022-08-24 ENCOUNTER — Ambulatory Visit: Payer: 59 | Admitting: Physical Therapy

## 2022-08-24 ENCOUNTER — Encounter: Payer: Self-pay | Admitting: Physical Therapy

## 2022-08-24 DIAGNOSIS — M6281 Muscle weakness (generalized): Secondary | ICD-10-CM | POA: Diagnosis not present

## 2022-08-24 DIAGNOSIS — Z981 Arthrodesis status: Secondary | ICD-10-CM

## 2022-08-24 DIAGNOSIS — R262 Difficulty in walking, not elsewhere classified: Secondary | ICD-10-CM | POA: Diagnosis not present

## 2022-08-24 DIAGNOSIS — R2689 Other abnormalities of gait and mobility: Secondary | ICD-10-CM | POA: Diagnosis not present

## 2022-08-24 DIAGNOSIS — M79621 Pain in right upper arm: Secondary | ICD-10-CM
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
## 2022-08-26 ENCOUNTER — Ambulatory Visit: Payer: 59 | Admitting: Physical Therapy

## 2022-08-26 ENCOUNTER — Encounter: Payer: Self-pay | Admitting: Physical Therapy

## 2022-08-26 DIAGNOSIS — M6281 Muscle weakness (generalized): Secondary | ICD-10-CM | POA: Diagnosis not present

## 2022-08-26 DIAGNOSIS — R262 Difficulty in walking, not elsewhere classified: Secondary | ICD-10-CM | POA: Diagnosis not present

## 2022-08-26 DIAGNOSIS — R2689 Other abnormalities of gait and mobility: Secondary | ICD-10-CM

## 2022-08-26 DIAGNOSIS — M79621 Pain in right upper arm: Secondary | ICD-10-CM | POA: Diagnosis not present

## 2022-08-26 DIAGNOSIS — Z981 Arthrodesis status: Secondary | ICD-10-CM | POA: Diagnosis not present
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
## 2022-08-31 ENCOUNTER — Ambulatory Visit: Payer: 59 | Attending: Physician Assistant | Admitting: Physical Therapy

## 2022-08-31 DIAGNOSIS — R262 Difficulty in walking, not elsewhere classified: Secondary | ICD-10-CM | POA: Insufficient documentation

## 2022-08-31 DIAGNOSIS — Z981 Arthrodesis status: Secondary | ICD-10-CM | POA: Diagnosis not present

## 2022-08-31 DIAGNOSIS — M79621 Pain in right upper arm: Secondary | ICD-10-CM | POA: Insufficient documentation

## 2022-08-31 DIAGNOSIS — R2689 Other abnormalities of gait and mobility: Secondary | ICD-10-CM | POA: Insufficient documentation

## 2022-08-31 DIAGNOSIS — M6281 Muscle weakness (generalized): Secondary | ICD-10-CM | POA: Insufficient documentation

## 2022-08-31 NOTE — Therapy (Unsigned)
OUTPATIENT PHYSICAL THERAPY TREATMENT   Patient Name: Tom Barrett MRN: [id number] DOB:August 22, 1960, 62 y.o., male Today's Date: 08/31/2022  END OF SESSION:   PT End of Session - 08/31/22 1253     Visit Number 16    Number of Visits 21    Date for PT Re-Evaluation 09/17/22    Authorization Type [employer] 2024, 30 per year PT/OT/speech    Authorization Time Period 07/06/22-09/14/22    Progress Note Due on Visit 14    PT Start Time 1248    PT Stop Time 1330    PT Time Calculation (min) 42 min    Activity Tolerance Patient tolerated treatment well;Patient limited by pain    Behavior During Therapy WFL for tasks assessed/performed             Past Medical History:  Diagnosis Date   Anemia    Arthritis    Carpal tunnel syndrome, bilateral    Complication of anesthesia    at 62 years old, he woke up being violent   Depression    Headache    eye migraine occasionally   Heberden's nodes    History of kidney stones    feels like he has passed a few stones   Hyperlipemia    Hypertension    Hypokalemia    Hypothyroidism    Malaise and fatigue    Sleep apnea    does not tolerate CPAP   Past Surgical History:  Procedure Laterality Date   CARPAL TUNNEL RELEASE Right 06/13/2020   Procedure: CARPAL TUNNEL RELEASE ENDOSCOPIC;  Surgeon: Christena Flake, MD;  Location: [facility];  Service: Orthopedics;  Laterality: Right;   CARPAL TUNNEL RELEASE Left 10/09/2020   Procedure: LEFT CARPAL TUNNEL RELEASE ENDOSCOPIC WITH SUBCUTANEOUS ANTERIOR TRANSPOSITION OF ULNER NERVE AT LEFT ELBOW.;  Surgeon: Christena Flake, MD;  Location: [facility];  Service: Orthopedics;  Laterality: Left;   CIRCUMCISION     as a teenager    COLONOSCOPY WITH PROPOFOL N/A 10/29/2014   Procedure: COLONOSCOPY WITH PROPOFOL;  Surgeon: Elnita Maxwell, MD;  Location: [facility];  Service: Endoscopy;  Laterality: N/A;   POSTERIOR CERVICAL LAMINECTOMY  12/23/2020   Procedure: POSTERIOR CERVICAL LAMINECTOMY C3-6;  Surgeon:  Lucy Chris, MD;  Location: [facility];  Service: Neurosurgery;;   TOTAL KNEE ARTHROPLASTY Right 11/09/2017   Procedure: TOTAL KNEE ARTHROPLASTY;  Surgeon: Christena Flake, MD;  Location: [facility];  Service: Orthopedics;  Laterality: Right;   TOTAL KNEE ARTHROPLASTY Left 10/10/2019   Procedure: TOTAL KNEE ARTHROPLASTY;  Surgeon: Christena Flake, MD;  Location: [facility];  Service: Orthopedics;  Laterality: Left;   Patient Active Problem List   Diagnosis Date Noted   Cervical stenosis of spinal canal 12/23/2020   Primary osteoarthritis of left knee 04/01/2018   Status post total knee replacement using cement, right 11/09/2017    PCP: Olena Leatherwood, FNP  REFERRING PROVIDER: Douglass Rivers, PA*   REFERRING DIAG:  Z98.1 (ICD-10-CM) - S/P cervical spinal fusion   THERAPY DIAG:  Pain in right upper arm  Muscle weakness (generalized)  S/P cervical spinal fusion  Difficulty in walking, not elsewhere classified  Imbalance  Rationale for Evaluation and Treatment Rehabilitation  PERTINENT HISTORY: Pt is a 62 year old male s/p C4-7 ACDF 04/13/22; Hx of previous posterior cervical spine laminectomy C3-6; Hx of T6-8 laminoplasty in November 2023. 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 He reports immediate improvement in his R arm. He reports notable weakness with R arm; he reports  ruling out RTC injury. Pt feels that home health therapy did help. Patient reports minimal strength in L hand. Pt reports some residual numbness affecting L>RUE. Patient reports history of forearm pain along extensors in each forearm that has improved. Pt reports not having major pain at arrival. He reports sensation of fullness in his hands. He reports mainly having numbness and weakness. Pt denies hypersensitivity or pain with light touch along L arm region. Pt using gabapentin for symptom management.    Pain:  Pain Intensity: Present: 0/10, Best: 0/10, Worst: 6/10 Pain location: L upper arm along distal 1/3  laterally Pain Quality:  numbness, sharp pain along L arm    Radiating: No, symptoms are localized along R arm  Numbness/Tingling: Yes,sensory loss, no paresthesias Focal Weakness: Yes, L shoulder L>R hand  Aggravating factors: neck stretches given from hospital Relieving factors: nothing seems to help arms, Gabapentin 24-hour pain behavior: AM is worst  History of prior neck injury, pain, surgery, or therapy: Yes; complex surgical history (see above)  Falls: Has patient fallen in last 6 months? No, Number of falls: N/A Follow-up appointment with MD: Yes, this Wednesday 07/08/22 Dominant hand: right Imaging: Yes ;   Post-op radiographs negative for any acute abnormalities/hardware issues     Prior level of function: Independent with community mobility with device; pt uses walking stick outside; IND with self-care/hygiene  Occupational demands: Pt out of work from previous job  Hobbies: fishing, able to play with grandchildren (4 y/o, 2y/o, 1 y/o)   Red flags (personal history of cancer, h/o spinal tumors, history of compression fracture, chills/fever, night sweats, nausea, vomiting, unrelenting pain): Negative   Weight Bearing Restrictions: No   Living Environment Lives with: lives with their spouse; wife is able to help patient at home. Pt is alone during the day when wife is at work. Grab bars in bathroom. Built-in bench. Pt has non-skid mat. 6 stairs to get in home with rails on both side. Flat terrain to get into home - sidewalk up to his front door  Lives in: House/apartment     Patient Goals:  More use of his hands and arms; would like to walk better       PRECAUTIONS: No lifting > 20 lbs, impaired gait/fall risk     SUBJECTIVE:                                                                                                                                                                                      SUBJECTIVE STATEMENT:  Patient reports feeling of wanting to "ball  up"/"curl up." He reports cutting his walk short today due to not feeling as well.  He reports no pain or spasm or numbness/tingling. He feels that symptoms were bad yesterday following weed eating and ongoing today.    PAIN:  Are you having pain? Yes, heaviness and numbness in upper limbs Pain location: Upper arms, hands    OBJECTIVE: (objective measures completed at initial evaluation unless otherwise dated)   Patient Surveys  FOTO 45, predicted outcome score of 37   Cognition Patient is oriented to person, place, and time.  Recent memory is intact.  Remote memory is intact.  Attention span and concentration are intact.  Expressive speech is intact.  Patient's fund of knowledge is within normal limits for educational level.                          Gross Musculoskeletal Assessment Tremor: None Bulk: Normal Tone: Normal     Gait Abductor lurch toward his L side, decreased TKE at terminal swing, dec arm swing and stiff trunk. Overpronation of bilat feet.    Posture Guarded posture, rounded shoulders      AROM AROM (Normal range in degrees) AROM 07/08/2022  Cervical  Flexion (50) 25  Extension (80) 30  Right lateral flexion (45) 28  Left lateral flexion (45) 23  Right rotation (85) 42  Left rotation (85) 33  (* = pain; Blank rows = not tested)     Shoulder AROM Shoulder flexion: R 144, L 30 Shoulder abduction: R 131, L 38  Shoulder ER: R WNL, L 47   08/10/22 Shoulder flexion: R 166, L 58 Shoulder abduction: R 151, L 55  Shoulder ER: R WNL, L 61     MMT MMT (out of 5) Right 07/08/2022 Left 07/08/2022 Right 08/10/22 Left 08/10/22  Cervical (isometric)    Flexion WNL    Extension WNL    Lateral Flexion WNL WNL    Rotation WNL WNL             Shoulder     Flexion 4 2- 4 2-  Extension        Abduction 4 2- 4 2-  Internal rotation 4 4 4  4+  External rotation  4 3+ 4 3+  Horizontal abduction        Horizontal adduction        Lower Trapezius         Rhomboids                 Elbow    Flexion 4 3+ 4+ 4  Extension 4 4 4+ 4  Pronation        Supination                 (* = pain; Blank rows = not tested)     LE MMT Hip flexion: R 4+, L 4- Quad: R 5-, L 4+ Ankle DF: R 4+, L 4+ Hip ABD: R 4, L 4     GRIP STRENGTH DYNAMOMETRY (07/09/22) Best of 3 trials R: 71.3,  L: 41.6   08/10/22 R: 63 lbs, L: 52.9 lbs     Sensation C7 and C8 loss of light touch LUE. Proprioception and hot/cold testing deferred on this date.   Reflexes R/L Elbow: 2+/2+  Brachioradialis: 2+/2+  Tricep: 2+/2+   Palpation No TTP along L arm/forearm     SPECIAL TESTS Hoffman Sign (cervical cord compression): R: Negative L: Negative ULTT Median: R: Not examined L: Not examined ULTT Ulnar: R: Not examined L: Not examined ULTT Radial: R:  Not examined L: Not examined      Clinical Test of Sensory Interaction for Balance    (CTSIB): Performed 07/09/22 CONDITION TIME STRATEGY SWAY  Eyes open, firm surface 30 seconds ankle   Eyes closed, firm surface 30 seconds ankle   Eyes open, foam surface 30 seconds ankle Increased  Eyes closed, foam surface 30 seconds Ankle, hip           Increased     Romberg: EO WNL, EC WNL (with increased sway)  DGI: 16/24  (07/09/22)      TODAY'S TREATMENT     Therapeutic Exercise - for improved soft tissue flexibility and extensibility as needed for ROM, shoulder AROM and exercises to improve forward reaching and shoulder elevation     Nustep; x 5 minutes, Level 3 - for LE soft tissue mobility and warm-up as well as upper limb AAROM   Standing gear shift with tall dowel; dowel anchored to floor with pt performing forward reaching in sagittal and scapular plane  -Forward reaching x 20  -Scapular plane reaching x 15, stopped due to increasing R shoulder pain   Bow and arrow thoracic rotation in sidelying; R and L x 10  Supine wand shoulder flexion AAROM; 2x10, back to wall to maintain upright position     Supine  shoulder flexion AROM with LUE with therapist assist to get best ROM; up to 90 deg obtained in supine; 2x8     PATIENT EDUCATION: Verbal and tactile cueing as well as PT demonstration as needed for carryover of exercise technique/form.     *not today*  Shoulder flexion slide on incline (towel sliding on treatment table with foot of bed reclined); x15 with raised incline, 60 deg Scapular retractions; 1 x 10, 5 sec hold -  for HEP addition Pulleys, seated with pulley anchored in doorway; shoulder flexion and abduction x 20  Tband high row for forward reaching/shoulder elevation with band anchored at top of door; Red Tband; 2x10  Wand shoulder flexion/abduction AAROM in supine; 2x10    Ball across table, into abduction, Silver physioball; 2x10 bilat UE Ball up wall, pt standing at wall; Silver physioball; 1x15 bilat UE on ball together     Neuromuscular Re-education - for improved sensory integration, static and dynamic postural control, equilibrium and non-equilibrium coordination as needed for negotiating home and community environment and stepping over obstacles   On blue agility ladder, high knees with opposite knee tap; 4  D/B    // bars:  Hurdle stepping; (3) 6-inch hurdles and (2) 12-inch hurdles, forward stepping D/B // bars x 2  -significant difficulty clearing R foot over 12-inch hurdles; this improves with second bout of exercise   *next visit*  Alternating lateral cone taps, 4 cones on R and 4 on L; D/B length of bars x 3     *not today*  Alternating foot taps on 12" step (staircase in center of gym, 2 stacked steps) without UE support:  20x standing on [employer]ire[employer]  Obstacle course  Hurdle stepping; 3 6-inch hurdles, forward stepping; performed 4x D/B with step-to pattern with sound clearance  Lateral hurdle stepping; 3 6-inch hurdles; 3x D/B   PATIENT EDUCATION:  Education details: Plan of care Person educated: Patient Education method: Explanation Education  comprehension: verbalized understanding     HOME EXERCISE PROGRAM: Access Code: [email or web address] URL: [email or web address] Date: 07/13/2022 Prepared by: Consuela Mimes  Exercises - Sit to Stand  - 2 x daily - 7 x weekly - 2 sets - 10  reps - Putty Squeezes  - 2 x daily - 7 x weekly - 1-2 sets - 2-3 min hold - Supine Shoulder Flexion with Dowel  - 2 x daily - 7 x weekly - 2 sets - 10 reps - Supine Shoulder External Rotation in 45 Degrees Abduction AAROM with Dowel  - 2 x daily - 7 x weekly - 2 sets - 10 reps - Seated Shoulder Flexion AAROM with Pulley Behind  - 2 x daily - 7 x weekly - 2-3 mins hold - Scapular retractions - 2 x daily - 7 x weekly - 2 sets - 10 reps      ASSESSMENT:   CLINICAL IMPRESSION: Capacity for forward reaching with L upper limb is improving per pt report. Pt is limited by decreased in sensation in upper limbs and persistent paresthesias with Hx of C4-7 ACDF 04/13/22. Pt is progressing well with dynamic balance work; we increased demand on equilibrium coordination and postural control with balance tasks performed today with pt having notable difficulty beginning novel balance tasks. Performance improves with successive repetitions with new tasks (12-inch hurdle steps and lateral cone taps with forward stepping). Patient has remaining deficits in L>R shoulder AROM, L>R upper limb strength, postural changes, postural control during gait/dynamic balance, dec neural/dural mobility, postural changes, and pain/L>R upper limb paresthesias. Patient will benefit from continued skilled therapeutic intervention to address the above deficits as needed for improved function and QoL.    REHAB POTENTIAL: Fair given complicated musculoskeletal history with multiple spinal surgeries, bilateral upper limb involvement   CLINICAL DECISION MAKING: Unstable/unpredictable   EVALUATION COMPLEXITY: High     GOALS: Goals reviewed with patient? Yes   SHORT TERM GOALS: Target date:  07/29/2022   Pt will be independent with HEP to improve strength and decrease neck pain to improve pain-free function at home and work. Baseline: 07/06/22: HEP to be initiated and reviewed next visit.    08/10/22: Compliant with HEP  Goal status: ACHIEVED     LONG TERM GOALS: Target date: 09/17/2022   Pt will increase FOTO to at least 57 to demonstrate significant improvement in function at home and work related to neck pain  Baseline: 07/06/22: 48    08/10/22: 47/57 Goal status: NOT MET    2.  Patient will have active R shoulder flexion and abduction to 130 deg or greater as needed for functional reaching, self-care activities, household work Baseline: 07/06/22: R shoulder AROM flexion 30, ABD 38.    08/10/22:  R shoulder AROM flexion 58, ABD 55.  Goal status: ON-GOING   3.  Pt will have grip strength (tested by dynamometer) within standard error of age-based/gender-based norm (R hand 88.2 lbs, L hand 83.78 lbs) indicative of improved ability to perform gripping/prehensile activities with each upper limb  Baseline: 07/06/22: Grip strength baseline to be formally tested next visit.   07/09/22: R: 71.3,  L: 41.6     08/10/22: R: 63 lbs, L: 52.9 lbs  Goal status: ON-GOING   4.  Pt will score DGI > or equal to 19 indicative of no increased risk of falling  Baseline: 07/06/22: Baseline to be obtained on visit # 3.  07/13/22: 16/24.    08/10/22: Deferred to next visit.    08/12/22: 16/24 Goal status: DEFERRED   5.  Pt will improve bilateral upper limb MMTs to 4+/5 indicative of improved strength as needed for moving items, carrying, lifting within post-op restrictions Baseline: 07/06/22: RUE strength grossly 4/5, LUE 2- to 4.  08/10/22: Improved R elbow MMT and L shoulder IR MMT, otherwise unchanged.   Goal status: NOT MET     PLAN: PT FREQUENCY: 2x/week   PT DURATION: 6-8 weeks   PLANNED INTERVENTIONS: Therapeutic exercises, Therapeutic activity, Neuromuscular re-education, Balance training, Gait training,  Patient/Family education, Dry Needling, Electrical stimulation, Cryotherapy, Moist heat, Taping, Ultrasound, and Manual therapy   PLAN FOR NEXT SESSION: Continue with work on shoulder/UE ROM and consider use of e-stim/NMES for improved muscle activation. Work on postural control and weightshifting drills as well as HEP for LE strengthening.   Consuela Mimes, PT, DPT [phone]  08/31/2022, 12:54 PM

## 2022-09-02 ENCOUNTER — Encounter: Payer: Self-pay | Admitting: Physical Therapy

## 2022-09-02 ENCOUNTER — Ambulatory Visit: Payer: 59 | Admitting: Physical Therapy

## 2022-09-02 DIAGNOSIS — M79621 Pain in right upper arm: Secondary | ICD-10-CM

## 2022-09-02 DIAGNOSIS — R262 Difficulty in walking, not elsewhere classified: Secondary | ICD-10-CM

## 2022-09-02 DIAGNOSIS — M6281 Muscle weakness (generalized): Secondary | ICD-10-CM

## 2022-09-02 DIAGNOSIS — R2689 Other abnormalities of gait and mobility: Secondary | ICD-10-CM | POA: Diagnosis not present

## 2022-09-02 DIAGNOSIS — Z981 Arthrodesis status: Secondary | ICD-10-CM
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
## 2022-09-14 ENCOUNTER — Encounter: Payer: Self-pay | Admitting: Physical Therapy

## 2022-09-14 ENCOUNTER — Ambulatory Visit: Payer: 59 | Admitting: Physical Therapy

## 2022-09-14 DIAGNOSIS — M79621 Pain in right upper arm: Secondary | ICD-10-CM

## 2022-09-14 DIAGNOSIS — Z981 Arthrodesis status: Secondary | ICD-10-CM

## 2022-09-14 DIAGNOSIS — R2689 Other abnormalities of gait and mobility: Secondary | ICD-10-CM

## 2022-09-14 DIAGNOSIS — M6281 Muscle weakness (generalized): Secondary | ICD-10-CM | POA: Diagnosis not present

## 2022-09-14 DIAGNOSIS — R262 Difficulty in walking, not elsewhere classified: Secondary | ICD-10-CM | POA: Diagnosis not present

## 2022-09-14 NOTE — Therapy (Signed)
OUTPATIENT PHYSICAL THERAPY TREATMENT   Patient Name: Tom Barrett MRN: [id number] DOB:13-Jan-1961, 62 y.o., male Today's Date: 09/14/2022  END OF SESSION:   PT End of Session - 09/14/22 0742     Visit Number 18    Number of Visits 21    Date for PT Re-Evaluation 09/17/22    Authorization Type [employer] 2024, 30 per year PT/OT/speech    Authorization Time Period 07/06/22-09/14/22    Progress Note Due on Visit 20    PT Start Time 0733    PT Stop Time 0817    PT Time Calculation (min) 44 min    Activity Tolerance Patient tolerated treatment well;Patient limited by pain    Behavior During Therapy [facility] for tasks assessed/performed              Past Medical History:  Diagnosis Date   Anemia    Arthritis    Carpal tunnel syndrome, bilateral    Complication of anesthesia    at 62 years old, he woke up being violent   Depression    Headache    eye migraine occasionally   Heberden's nodes    History of kidney stones    feels like he has passed a few stones   Hyperlipemia    Hypertension    Hypokalemia    Hypothyroidism    Malaise and fatigue    Sleep apnea    does not tolerate CPAP   Past Surgical History:  Procedure Laterality Date   CARPAL TUNNEL RELEASE Right 06/13/2020   Procedure: CARPAL TUNNEL RELEASE ENDOSCOPIC;  Surgeon: Christena Flake, MD;  Location: [facility] ORS;  Service: Orthopedics;  Laterality: Right;   CARPAL TUNNEL RELEASE Left 10/09/2020   Procedure: LEFT CARPAL TUNNEL RELEASE ENDOSCOPIC WITH SUBCUTANEOUS ANTERIOR TRANSPOSITION OF ULNER NERVE AT LEFT ELBOW.;  Surgeon: Christena Flake, MD;  Location: [facility];  Service: Orthopedics;  Laterality: Left;   CIRCUMCISION     as a teenager    COLONOSCOPY WITH PROPOFOL N/A 10/29/2014   Procedure: COLONOSCOPY WITH PROPOFOL;  Surgeon: Elnita Maxwell, MD;  Location: [facility] ENDOSCOPY;  Service: Endoscopy;  Laterality: N/A;   POSTERIOR CERVICAL LAMINECTOMY  12/23/2020   Procedure: POSTERIOR CERVICAL LAMINECTOMY C3-6;   Surgeon: Lucy Chris, MD;  Location: [facility];  Service: Neurosurgery;;   TOTAL KNEE ARTHROPLASTY Right 11/09/2017   Procedure: TOTAL KNEE ARTHROPLASTY;  Surgeon: Christena Flake, MD;  Location: [facility];  Service: Orthopedics;  Laterality: Right;   TOTAL KNEE ARTHROPLASTY Left 10/10/2019   Procedure: TOTAL KNEE ARTHROPLASTY;  Surgeon: Christena Flake, MD;  Location: [facility] OR[facility];  Service: Orthopedics;  Laterality: Left;   Patient Active Problem List   Diagnosis Date Noted   Cervical stenosis of spinal canal 12/23/2020   Primary osteoarthritis of left knee 04/01/2018   Status post total knee replacement using cement, right 11/09/2017    PCP: Olena Leatherwood, FNP  REFERRING PROVIDER: Douglass Rivers, PA*   REFERRING DIAG:  Z98.1 (ICD-10-CM) - S/P cervical spinal fusion   THERAPY DIAG:  Pain in right upper arm  Muscle weakness (generalized)  S/P cervical spinal fusion  Difficulty in walking, not elsewhere classified  Imbalance  Rationale for Evaluation and Treatment Rehabilitation  PERTINENT HISTORY: Pt is a 62 year old male s/p C4-7 ACDF 04/13/22; Hx of previous posterior cervical spine laminectomy C3-6; Hx of T6-8 laminoplasty in November 2023. 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 He reports immediate improvement in his R arm. He reports notable weakness with R arm; he  reports ruling out RTC injury. Pt feels that home health therapy did help. Patient reports minimal strength in L hand. Pt reports some residual numbness affecting L>RUE. Patient reports history of forearm pain along extensors in each forearm that has improved. Pt reports not having major pain at arrival. He reports sensation of fullness in his hands. He reports mainly having numbness and weakness. Pt denies hypersensitivity or pain with light touch along L arm region. Pt using gabapentin for symptom management.    Pain:  Pain Intensity: Present: 0/10, Best: 0/10, Worst: 6/10 Pain location: L upper arm along distal 1/3  laterally Pain Quality:  numbness, sharp pain along L arm    Radiating: No, symptoms are localized along R arm  Numbness/Tingling: Yes,sensory loss, no paresthesias Focal Weakness: Yes, L shoulder L>R hand  Aggravating factors: neck stretches given from hospital Relieving factors: nothing seems to help arms, Gabapentin 24-hour pain behavior: AM is worst  History of prior neck injury, pain, surgery, or therapy: Yes; complex surgical history (see above)  Falls: Has patient fallen in last 6 months? No, Number of falls: N/A Follow-up appointment with MD: Yes, this Wednesday 07/08/22 Dominant hand: right Imaging: Yes ;   Post-op radiographs negative for any acute abnormalities/hardware issues     Prior level of function: Independent with community mobility with device; pt uses walking stick outside; IND with self-care/hygiene  Occupational demands: Pt out of work from previous job  Hobbies: fishing, able to play with grandchildren (4 y/o, 2y/o, 1 y/o)   Red flags (personal history of cancer, h/o spinal tumors, history of compression fracture, chills/fever, night sweats, nausea, vomiting, unrelenting pain): Negative   Weight Bearing Restrictions: No   Living Environment Lives with: lives with their spouse; wife is able to help patient at home. Pt is alone during the day when wife is at work. Grab bars in bathroom. Built-in bench. Pt has non-skid mat. 6 stairs to get in home with rails on both side. Flat terrain to get into home - sidewalk up to his front door  Lives in: House/apartment     Patient Goals:  More use of his hands and arms; would like to walk better       PRECAUTIONS: No lifting > 20 lbs, impaired gait/fall risk     SUBJECTIVE:                                                                                                                                                                                      SUBJECTIVE STATEMENT:  Patient reports that he has struggled with  L upper limb pain and numbness over the previous week. He reports significantly limited  functional use of L upper limb. He reports difficulty with steadiness during gait. He reports being compliant with HEP on most days, but that he held back on exercise when he was not feeling well some days during the previous week. He reports difficulty tolerating L shoulder ER AAROM and having one significant episode of pain following completion of this exercise one time at home.  Pt reports frustration with standing balance drills after he has been sitting at his desk for longer time with notable difficulty performing them.    PAIN:  Are you having pain? Yes, heaviness and numbness in upper limbs Pain location: Upper arms, hands    OBJECTIVE: (objective measures completed at initial evaluation unless otherwise dated)  Gait Abductor lurch toward his L side, decreased TKE at terminal swing, dec arm swing and stiff trunk. Overpronation of bilat feet.    Posture Guarded posture, rounded shoulders      AROM AROM (Normal range in degrees) AROM 07/08/2022  Cervical  Flexion (50) 25  Extension (80) 30  Right lateral flexion (45) 28  Left lateral flexion (45) 23  Right rotation (85) 42  Left rotation (85) 33  (* = pain; Blank rows = not tested)     Shoulder AROM Shoulder flexion: R 144, L 30 Shoulder abduction: R 131, L 38  Shoulder ER: R WNL, L 47   08/10/22 Shoulder flexion: R 166, L 58 Shoulder abduction: R 151, L 55  Shoulder ER: R WNL, L 61     MMT MMT (out of 5) Right 07/08/2022 Left 07/08/2022 Right 08/10/22 Left 08/10/22  Cervical (isometric)    Flexion WNL    Extension WNL    Lateral Flexion WNL WNL    Rotation WNL WNL             Shoulder     Flexion 4 2- 4 2-  Extension        Abduction 4 2- 4 2-  Internal rotation 4 4 4  4+  External rotation  4 3+ 4 3+  Horizontal abduction        Horizontal adduction        Lower Trapezius        Rhomboids                 Elbow     Flexion 4 3+ 4+ 4  Extension 4 4 4+ 4  Pronation        Supination                 (* = pain; Blank rows = not tested)     LE MMT Hip flexion: R 4+, L 4- Quad: R 5-, L 4+ Ankle DF: R 4+, L 4+ Hip ABD: R 4, L 4     GRIP STRENGTH DYNAMOMETRY (07/09/22) Best of 3 trials R: 71.3,  L: 41.6   08/10/22 R: 63 lbs, L: 52.9 lbs        TODAY'S TREATMENT     Therapeutic Exercise - for improved soft tissue flexibility and extensibility as needed for ROM, shoulder AROM and exercises to improve forward reaching and shoulder elevation     Nustep; x 5 minutes, Level 4 - for LE soft tissue mobility and warm-up as well as upper limb AAROM     Supine shoulder flexion AROM with LUE with therapist assist to get best ROM; up to 90 deg obtained in supine; 2x8    Standing wand shoulder flexion AAROM; 2x10, back to wall to maintain  upright position   Single-arm shoulder extension with L upper limb, Red Tband; 2x10 for AAROM forward elevation      Finger ladder; x 4, 3-5 sec hold at top, CGA with PT gently guarding L upper limb to prevent uncontrolled descent of arm due to known sensory changes and motor control deficits     PATIENT EDUCATION: HEP update and review. Discussed use of single-arm row for shoulder elevation AAROM and lateral step-over following forward hurdle stepping at home next to counter. We discussed completing 5 minutes of walking or recumbent cycling prior to standing exercise to improve stiffness and mobility.     *not today* Bow and arrow thoracic rotation in sidelying; R and L x 10  Standing gear shift with tall dowel; dowel anchored to floor with pt performing forward reaching in sagittal and scapular plane  -Forward reaching x 20  -Scapular plane reaching x 20  Shoulder flexion slide on incline (towel sliding on treatment table with foot of bed reclined); x15 with raised incline, 60 deg Scapular retractions; 1 x 10, 5 sec hold -  for HEP addition Pulleys, seated  with pulley anchored in doorway; shoulder flexion and abduction x 20  Tband high row for forward reaching/shoulder elevation with band anchored at top of door; Red Tband; 2x10  Wand shoulder flexion/abduction AAROM in supine; 2x10    Ball across table, into abduction, Silver physioball; 2x10 bilat UE Ball up wall, pt standing at wall; Silver physioball; 1x15 bilat UE on ball together     Neuromuscular Re-education - for improved sensory integration, static and dynamic postural control, equilibrium and non-equilibrium coordination as needed for negotiating home and community environment and stepping over obstacles   Hurdle stepping; (3) 6-inch hurdles, forward stepping D/B // bars x 4  Hurdle step over single obstacle, lateral step-over today with both feet on either side of obstacle (used dumbbell today); 2x10 alternating R/L     *next visit*  On blue agility ladder, high knees with opposite knee tap; 4x  D/B   Alternating lateral cone taps, 4 cones on R and 4 on L; D/B length of bars x 3     *not today*  Alternating foot taps on 12" step (staircase in center of gym, 2 stacked steps) without UE support:  20x   Obstacle course  Hurdle stepping; 3 6-inch hurdles, forward stepping; performed 4x D/B with step-to pattern with sound clearance  Lateral hurdle stepping; 3 6-inch hurdles; 3x D/B   PATIENT EDUCATION:  Education details: Plan of care Person educated: Patient Education method: Explanation Education comprehension: verbalized understanding     HOME EXERCISE PROGRAM: Access Code: [email or web address] URL: [email or web address] Date: 09/02/2022 Prepared by: Consuela Mimes  Exercises - Putty Squeezes  - 2 x daily - 7 x weekly - 1-2 sets - 2-3 min hold - Supine Shoulder Flexion with Dowel  - 2 x daily - 7 x weekly - 2 sets - 10 reps - Supine Shoulder External Rotation in 45 Degrees Abduction AAROM with Dowel  - 2 x daily - 7 x weekly - 2 sets - 10 reps - Seated Shoulder  Flexion AAROM with Pulley Behind  - 2 x daily - 7 x weekly - 2-3 mins hold - Supine Shoulder Flexion Extension Full Range AROM  - 2 x daily - 7 x weekly - 2 sets - 8-10 reps - Seated Shoulder Flexion Towel Slide at Table Top  - 1 x daily - 7 x weekly - 2 sets - 10 reps -  Sit to Stand  - 2 x daily - 7 x weekly - 2 sets - 10 reps - Standing Anterior Toe Taps  - 2 x daily - 7 x weekly - 2 sets - 10 reps - Forward and Backward Step Over with Counter Support  - 2 x daily - 7 x weekly - 2 sets - 10 reps    ASSESSMENT:   CLINICAL IMPRESSION:  Patient has been experiencing significant L upper limb paresthesias and discomfort and reports ongoing significant decrease in LUE functional use. Pt reports ongoing difficulties with gait stability and he feels that he becomes frustrated with attempted standing balance/stepping drills at home. We dicussed generally positive prognosis with C5 nerve palsy and use of stationary bike to improve stiffness following prolonged periods of sitting/sedentary activity. Pt does report improved ability to access forward elevation of L shoulder and reports responding well with banded exercises. Frequency has been modified to 1x/week and patient's HEP will be updated with successive visits for additional upper limb mobility and balance work. Patient has remaining deficits in L>R shoulder AROM, L>R upper limb strength, postural changes, postural control during gait/dynamic balance, dec neural/dural mobility, postural changes, and pain/L>R upper limb paresthesias. Patient will benefit from continued skilled therapeutic intervention to address the above deficits as needed for improved function and QoL.    REHAB POTENTIAL: Fair given complicated musculoskeletal history with multiple spinal surgeries, bilateral upper limb involvement   CLINICAL DECISION MAKING: Unstable/unpredictable   EVALUATION COMPLEXITY: High     GOALS: Goals reviewed with patient? Yes   SHORT TERM GOALS:  Target date: 07/29/2022   Pt will be independent with HEP to improve strength and decrease neck pain to improve pain-free function at home and work. Baseline: 07/06/22: HEP to be initiated and reviewed next visit.    08/10/22: Compliant with HEP  Goal status: ACHIEVED     LONG TERM GOALS: Target date: 09/17/2022   Pt will increase FOTO to at least 57 to demonstrate significant improvement in function at home and work related to neck pain  Baseline: 07/06/22: 48    08/10/22: 47/57 Goal status: NOT MET    2.  Patient will have active R shoulder flexion and abduction to 130 deg or greater as needed for functional reaching, self-care activities, household work Baseline: 07/06/22: R shoulder AROM flexion 30, ABD 38.    08/10/22:  R shoulder AROM flexion 58, ABD 55.  Goal status: ON-GOING   3.  Pt will have grip strength (tested by dynamometer) within standard error of age-based/gender-based norm (R hand 88.2 lbs, L hand 83.78 lbs) indicative of improved ability to perform gripping/prehensile activities with each upper limb  Baseline: 07/06/22: Grip strength baseline to be formally tested next visit.   07/09/22: R: 71.3,  L: 41.6     08/10/22: R: 63 lbs, L: 52.9 lbs  Goal status: ON-GOING   4.  Pt will score DGI > or equal to 19 indicative of no increased risk of falling  Baseline: 07/06/22: Baseline to be obtained on visit # 3.  07/13/22: 16/24.    08/10/22: Deferred to next visit.    08/12/22: 16/24 Goal status: DEFERRED   5.  Pt will improve bilateral upper limb MMTs to 4+/5 indicative of improved strength as needed for moving items, carrying, lifting within post-op restrictions Baseline: 07/06/22: RUE strength grossly 4/5, LUE 2- to 4.    08/10/22: Improved R elbow MMT and L shoulder IR MMT, otherwise unchanged.   Goal status: NOT MET  PLAN: PT FREQUENCY: 2x/week   PT DURATION: 6-8 weeks   PLANNED INTERVENTIONS: Therapeutic exercises, Therapeutic activity, Neuromuscular re-education, Balance training,  Gait training, Patient/Family education, Dry Needling, Electrical stimulation, Cryotherapy, Moist heat, Taping, Ultrasound, and Manual therapy   PLAN FOR NEXT SESSION: Continue with work on shoulder/UE ROM and consider use of e-stim/NMES for improved muscle activation. Work on postural control and weightshifting drills as well as HEP for LE strengthening.   Consuela Mimes, PT, DPT [phone]  09/14/2022, 8:18 AM

## 2022-09-21 ENCOUNTER — Encounter: Payer: Self-pay | Admitting: Physical Therapy

## 2022-09-21 ENCOUNTER — Ambulatory Visit: Payer: 59 | Admitting: Physical Therapy

## 2022-09-21 DIAGNOSIS — M6281 Muscle weakness (generalized): Secondary | ICD-10-CM

## 2022-09-21 DIAGNOSIS — M79621 Pain in right upper arm: Secondary | ICD-10-CM

## 2022-09-21 DIAGNOSIS — R262 Difficulty in walking, not elsewhere classified: Secondary | ICD-10-CM | POA: Diagnosis not present

## 2022-09-21 DIAGNOSIS — Z981 Arthrodesis status: Secondary | ICD-10-CM | POA: Diagnosis not present

## 2022-09-21 DIAGNOSIS — R2689 Other abnormalities of gait and mobility: Secondary | ICD-10-CM
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
## 2022-09-28 ENCOUNTER — Ambulatory Visit: Payer: 59 | Admitting: Physical Therapy

## 2022-09-28 DIAGNOSIS — R2689 Other abnormalities of gait and mobility: Secondary | ICD-10-CM | POA: Diagnosis not present

## 2022-09-28 DIAGNOSIS — M79621 Pain in right upper arm: Secondary | ICD-10-CM | POA: Diagnosis not present

## 2022-09-28 DIAGNOSIS — R262 Difficulty in walking, not elsewhere classified: Secondary | ICD-10-CM | POA: Diagnosis not present

## 2022-09-28 DIAGNOSIS — M6281 Muscle weakness (generalized): Secondary | ICD-10-CM

## 2022-09-28 DIAGNOSIS — Z981 Arthrodesis status: Secondary | ICD-10-CM

## 2022-09-28 NOTE — Therapy (Signed)
OUTPATIENT PHYSICAL THERAPY TREATMENT AND PROGRESS NOTE   Dates of reporting period  08/10/22   to   09/28/22    Patient Name: JAYVONNE HOE MRN: [id number] DOB:01-16-1961, 62 y.o., male Today's Date: 09/28/2022  END OF SESSION:   PT End of Session - 09/28/22 0834     Visit Number 20    Number of Visits 25    Date for PT Re-Evaluation 09/17/22    Authorization Type [employer] 2024, 30 per year PT/OT/speech    Authorization Time Period 07/06/22-09/14/22    Progress Note Due on Visit 25    PT Start Time 0732    PT Stop Time 0819    PT Time Calculation (min) 47 min    Equipment Utilized During Treatment Gait belt   Gait belt for balance testing/training   Activity Tolerance Patient tolerated treatment well;Patient limited by pain    Behavior During Therapy WFL for tasks assessed/performed                Past Medical History:  Diagnosis Date   Anemia    Arthritis    Carpal tunnel syndrome, bilateral    Complication of anesthesia    at 62 years old, he woke up being violent   Depression    Headache    eye migraine occasionally   Heberden's nodes    History of kidney stones    feels like he has passed a few stones   Hyperlipemia    Hypertension    Hypokalemia    Hypothyroidism    Malaise and fatigue    Sleep apnea    does not tolerate CPAP   Past Surgical History:  Procedure Laterality Date   CARPAL TUNNEL RELEASE Right 06/13/2020   Procedure: CARPAL TUNNEL RELEASE ENDOSCOPIC;  Surgeon: Christena Flake, MD;  Location: [facility];  Service: Orthopedics;  Laterality: Right;   CARPAL TUNNEL RELEASE Left 10/09/2020   Procedure: LEFT CARPAL TUNNEL RELEASE ENDOSCOPIC WITH SUBCUTANEOUS ANTERIOR TRANSPOSITION OF ULNER NERVE AT LEFT ELBOW.;  Surgeon: Christena Flake, MD;  Location: [facility];  Service: Orthopedics;  Laterality: Left;   CIRCUMCISION     as a teenager    COLONOSCOPY WITH PROPOFOL N/A 10/29/2014   Procedure: COLONOSCOPY WITH PROPOFOL;  Surgeon: Elnita Maxwell, MD;   Location: [facility] END[facility];  Service: Endoscopy;  Laterality: N/A;   POSTERIOR CERVICAL LAMINECTOMY  12/23/2020   Procedure: POSTERIOR CERVICAL LAMINECTOMY C3-6;  Surgeon: Lucy Chris, MD;  Location: [facility];  Service: Neurosurgery;;   TOTAL KNEE ARTHROPLASTY Right 11/09/2017   Procedure: TOTAL KNEE ARTHROPLASTY;  Surgeon: Christena Flake, MD;  Location: [facility];  Service: Orthopedics;  Laterality: Right;   TOTAL KNEE ARTHROPLASTY Left 10/10/2019   Procedure: TOTAL KNEE ARTHROPLASTY;  Surgeon: Christena Flake, MD;  Location: [facility];  Service: Orthopedics;  Laterality: Left;   Patient Active Problem List   Diagnosis Date Noted   Cervical stenosis of spinal canal 12/23/2020   Primary osteoarthritis of left knee 04/01/2018   Status post total knee replacement using cement, right 11/09/2017    PCP: Olena Leatherwood, FNP  REFERRING PROVIDER: Douglass Rivers, PA*   REFERRING DIAG:  Z98.1 (ICD-10-CM) - S/P cervical spinal fusion   THERAPY DIAG:  No diagnosis found.  Rationale for Evaluation and Treatment Rehabilitation  PERTINENT HISTORY: Pt is a 62 year old male s/p C4-7 ACDF 04/13/22; Hx of previous posterior cervical spine laminectomy C3-6; Hx of T6-8 laminoplasty in November 2023. 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 He  reports immediate improvement in his R arm. He reports notable weakness with R arm; he reports ruling out RTC injury. Pt feels that home health therapy did help. Patient reports minimal strength in L hand. Pt reports some residual numbness affecting L>RUE. Patient reports history of forearm pain along extensors in each forearm that has improved. Pt reports not having major pain at arrival. He reports sensation of fullness in his hands. He reports mainly having numbness and weakness. Pt denies hypersensitivity or pain with light touch along L arm region. Pt using gabapentin for symptom management.    Pain:  Pain Intensity: Present: 0/10, Best: 0/10, Worst: 6/10 Pain  location: L upper arm along distal 1/3 laterally Pain Quality:  numbness, sharp pain along L arm    Radiating: No, symptoms are localized along R arm  Numbness/Tingling: Yes,sensory loss, no paresthesias Focal Weakness: Yes, L shoulder L>R hand  Aggravating factors: neck stretches given from hospital Relieving factors: nothing seems to help arms, Gabapentin 24-hour pain behavior: AM is worst  History of prior neck injury, pain, surgery, or therapy: Yes; complex surgical history (see above)  Falls: Has patient fallen in last 6 months? No, Number of falls: N/A Follow-up appointment with MD: Yes, this Wednesday 07/08/22 Dominant hand: right Imaging: Yes ;   Post-op radiographs negative for any acute abnormalities/hardware issues     Prior level of function: Independent with community mobility with device; pt uses walking stick outside; IND with self-care/hygiene  Occupational demands: Pt out of work from previous job  Hobbies: fishing, able to play with grandchildren (4 y/o, 2y/o, 1 y/o)   Red flags (personal history of cancer, h/o spinal tumors, history of compression fracture, chills/fever, night sweats, nausea, vomiting, unrelenting pain): Negative   Weight Bearing Restrictions: No   Living Environment Lives with: lives with their spouse; wife is able to help patient at home. Pt is alone during the day when wife is at work. Grab bars in bathroom. Built-in bench. Pt has non-skid mat. 6 stairs to get in home with rails on both side. Flat terrain to get into home - sidewalk up to his front door  Lives in: House/apartment     Patient Goals:  More use of his hands and arms; would like to walk better       PRECAUTIONS: No lifting > 20 lbs, impaired gait/fall risk     SUBJECTIVE:                                                                                                                                                                                      SUBJECTIVE STATEMENT:   Patient  reports having a few  incidents of flare-ups and regions of pain over previous week. He reports one episode of low back pain that was irritated by movement and trunk rotation mainly - minimal pain at rest. Patient reports R arm pain from pushing shoulder flexion AAROM aggressively (standing wand flexion). Patient reports 65% global rating of improvement. Patient reports he needs more use of hands and his arms, improved feeling in his hands. He reports he does get around functionally now. Patient reports that use of hands is main limiting factor for QoL. He reports not having notable pain at arrival. He feels that neurodynamic exercise has helped notably. He reports less tension in his R shoulder.    PAIN:  Are you having pain? Yes, heaviness and numbness in upper limbs Pain location: Upper arms, hands    OBJECTIVE: (objective measures completed at initial evaluation unless otherwise dated)  Gait Abductor lurch toward his L side, decreased TKE at terminal swing, dec arm swing and stiff trunk. Overpronation of bilat feet.    Posture Guarded posture, rounded shoulders      AROM AROM (Normal range in degrees) AROM 07/08/2022  Cervical  Flexion (50) 25  Extension (80) 30  Right lateral flexion (45) 28  Left lateral flexion (45) 23  Right rotation (85) 42  Left rotation (85) 33  (* = pain; Blank rows = not tested)     Shoulder AROM Shoulder flexion: R 144, L 30 Shoulder abduction: R 131, L 38  Shoulder ER: R WNL, L 47   08/10/22 Shoulder flexion: R 166, L 58 Shoulder abduction: R 151, L 55  Shoulder ER: R WNL, L 61   09/28/22 Shoulder flexion: R 157, L 64  (In supine: R WFL, L 105) Shoulder abduction: R 160, L 62  Shoulder ER: R WNL, L 61    MMT MMT (out of 5) Right 07/08/2022 Left 07/08/2022 Right 08/10/22 Left 08/10/22 Right 09/28/22 Left 09/28/22  Cervical (isometric)      Flexion WNL      Extension WNL      Lateral Flexion WNL WNL      Rotation WNL WNL                  Shoulder       Flexion 4 2- 4 2- 4+ 2-  Extension          Abduction 4 2- 4 2- 4 2-  Internal rotation 4 4 4  4+ 5 4+  External rotation  4 3+ 4 3+ 4+ 4-  Horizontal abduction          Horizontal adduction          Lower Trapezius          Rhomboids                     Elbow      Flexion 4 3+ 4+ 4 5 4   Extension 4 4 4+ 4 5 4+  Pronation          Supination                     (* = pain; Blank rows = not tested)     LE MMT Hip flexion: R 4+, L 4- Quad: R 5-, L 4+ Ankle DF: R 4+, L 4+ Hip ABD: R 4, L 4     GRIP STRENGTH DYNAMOMETRY (07/09/22) Best of 3 trials R: 71.3,  L: 41.6   08/10/22 R: 63 lbs, L: 52.9  lbs   09/28/22 R: 66.8 lbs, L: 62.3 lbs       TODAY'S TREATMENT     Therapeutic Exercise - for improved soft tissue flexibility and extensibility as needed for ROM, shoulder AROM and exercises to improve forward reaching and shoulder elevation     Nustep; x 5 minutes, Level 4 - for LE soft tissue mobility and warm-up as well as upper limb AAROM    *GOAL UPDATE PERFORMED     PATIENT EDUCATION: Discussed current progress with PT, rate of healing with peripheral nerve healing/impairment, prognosis, continued POC. We discussed strategies for sensory stimulation at home (moving hand in bed of rice/bed of uncooked beans with duration as tolerated).      *next visit*  Median nerve glides, standing; 2x10 (ULTT2A technique) Ball up wall, pt standing at wall; yellow physioball; 1x15 bilat UE on ball together  Single-arm shoulder extension with L upper limb, Red Tband; 2x10 for AAROM forward elevation   Finger ladder; x 4, 3-5 sec hold at top, CGA with PT gently guarding L upper limb to prevent uncontrolled descent of arm due to known sensory changes and motor control deficits     *not today*  Supine chop with L arm, 90 deg shoulder flexion; 2 x 20 reps Supine shoulder flexion AROM with LUE with therapist assist to get best ROM; up to 90 deg obtained in supine;  2x8  Standing wand shoulder flexion AAROM; 2x10, back to wall to maintain upright position Bow and arrow thoracic rotation in sidelying; R and L x 10  Standing gear shift with tall dowel; dowel anchored to floor with pt performing forward reaching in sagittal and scapular plane  -Forward reaching x 20  -Scapular plane reaching x 20  Shoulder flexion slide on incline (towel sliding on treatment table with foot of bed reclined); x15 with raised incline, 60 deg Scapular retractions; 1 x 10, 5 sec hold -  for HEP addition Pulleys, seated with pulley anchored in doorway; shoulder flexion and abduction x 20  Tband high row for forward reaching/shoulder elevation with band anchored at top of door; Red Tband; 2x10  Wand shoulder flexion/abduction AAROM in supine; 2x10    Ball across table, into abduction, Silver physioball; 2x10 bilat UE      Neuromuscular Re-education - for improved sensory integration, static and dynamic postural control, equilibrium and non-equilibrium coordination as needed for negotiating home and community environment and stepping over obstacles     *next visit* Hurdle stepping; (3) 6-inch hurdles and 2 Airex pads, forward stepping D/B // bars x 5  Standing FT eyes closed on Airex; multiple attempts required, pt able to maintain up to 30 sec  On blue agility ladder, high knees with opposite knee tap; 4x  D/B   Alternating lateral cone taps, 4 cones on R and 4 on L; D/B length of bars x 3     *not today* Forward step up, 6-inch step; staircase in center of gym; 1x10  Hurdle step over single obstacle, lateral step-over today with both feet on either side of obstacle (used dumbbell today); 2x10 alternating R/L  Alternating foot taps on 12" step (staircase in center of gym, 2 stacked steps) without UE support:  20x   Obstacle course  Hurdle stepping; 3 6-inch hurdles, forward stepping; performed 4x D/B with step-to pattern with sound clearance  Lateral hurdle stepping; 3  6-inch hurdles; 3x D/B   PATIENT EDUCATION:  Education details: Plan of care Person educated: Patient Education method: Explanation Education  comprehension: verbalized understanding     HOME EXERCISE PROGRAM: Access Code: [email or web address] URL: [email or web address] Date: 09/21/2022 Prepared by: Consuela Mimes  Exercises - Putty Squeezes  - 2 x daily - 7 x weekly - 1-2 sets - 2-3 min hold - Supine Shoulder Flexion with Dowel  - 2 x daily - 7 x weekly - 2 sets - 10 reps - Supine Shoulder External Rotation in 45 Degrees Abduction AAROM with Dowel  - 2 x daily - 7 x weekly - 2 sets - 10 reps - Seated Shoulder Flexion AAROM with Pulley Behind  - 2 x daily - 7 x weekly - 2-3 mins hold - Supine Shoulder Flexion Extension Full Range AROM  - 2 x daily - 7 x weekly - 2 sets - 8-10 reps - Seated Shoulder Flexion Towel Slide at Table Top  - 1 x daily - 7 x weekly - 2 sets - 10 reps - Median Nerve Flossing  - 1 x daily - 7 x weekly - 2 sets - 10 reps - Sit to Stand  - 2 x daily - 7 x weekly - 2 sets - 10 reps - Standing Anterior Toe Taps  - 2 x daily - 7 x weekly - 2 sets - 10 reps - Forward and Backward Step Over with Counter Support  - 2 x daily - 7 x weekly - 2 sets - 10 reps - Forward Step Up with Counter Support  - 1 x daily - 7 x weekly - 2 sets - 10 reps     ASSESSMENT:   CLINICAL IMPRESSION: Patient has not improved FOTO to date. He has significantly improved R upper limb AROM. He has no major change in flexion/ABD manual muscle tests, but other tests have improved. Pt has generally good R upper limb strength and AROM. Pt has substantially improved grip strength. DGI has improved by 1 point only, not yet attaining MCID. Pt is limited by variable levels of pain in different body regions, and this does sometimes limit his physical activity and consistency with walking program. He does report improved mobility of L upper limb, but he states that he still does not get much functional  use out of it with self-care activities/ADLs. Pt has demonstrated good static postural control, but he does have variable dynamic postural control during gait and he reports sometimes he staggers more when walking forward. Pt has made fair progress to date, and he needs further work on upper limb ROM, functional upper limb use, and postural control with gait/gait stability.  Patient has remaining deficits in L>R shoulder AROM, L>R upper limb strength including impaired grip strength, postural changes, postural control during gait/dynamic balance, dec neural/dural mobility, postural changes, and pain/L>R upper limb paresthesias. Patient will benefit from continued skilled therapeutic intervention to address the above deficits as needed for improved function and QoL.    REHAB POTENTIAL: Fair given complicated musculoskeletal history with multiple spinal surgeries, bilateral upper limb involvement   CLINICAL DECISION MAKING: Unstable/unpredictable   EVALUATION COMPLEXITY: High     GOALS: Goals reviewed with patient? Yes   SHORT TERM GOALS: Target date: 07/29/2022   Pt will be independent with HEP to improve strength and decrease neck pain to improve pain-free function at home and work. Baseline: 07/06/22: HEP to be initiated and reviewed next visit.    08/10/22: Compliant with HEP  Goal status: ACHIEVED     LONG TERM GOALS: Target date: 09/17/2022   Pt will increase FOTO to at least 57 to  demonstrate significant improvement in function at home and work related to neck pain  Baseline: 07/06/22: 48    08/10/22: 47/57   09/28/22:  45/57 Goal status: NOT MET    2.  Patient will have active R shoulder flexion and abduction to 130 deg or greater as needed for functional reaching, self-care activities, household work Baseline: 07/06/22: R shoulder AROM flexion 30, ABD 38.    08/10/22:  R shoulder AROM flexion 58, ABD 55.    09/28/22: R shoulder AROM flexion 64, ABD 62.   Goal status: ON-GOING   3.  Pt will  have grip strength (tested by dynamometer) within standard error of age-based/gender-based norm (R hand 88.2 lbs, L hand 83.78 lbs) indicative of improved ability to perform gripping/prehensile activities with each upper limb  Baseline: 07/06/22: Grip strength baseline to be formally tested next visit.   07/09/22: R: 71.3,  L: 41.6     08/10/22: R: 63 lbs, L: 52.9 lbs    09/28/22: R: 66.8 lbs, L: 62.3 lbs  Goal status: IN PROGRESS   4.  Pt will score DGI > or equal to 19 indicative of no increased risk of falling  Baseline: 07/06/22: Baseline to be obtained on visit # 3.  07/13/22: 16/24.    08/10/22: Deferred to next visit.    08/12/22: 16/24   09/28/22: 17/24 Goal status: NOT MET    5.  Pt will improve bilateral upper limb MMTs to 4+/5 indicative of improved strength as needed for moving items, carrying, lifting within post-op restrictions Baseline: 07/06/22: RUE strength grossly 4/5, LUE 2- to 4.    08/10/22: Improved R elbow MMT and L shoulder IR MMT, otherwise unchanged.    09/28/22: R shoulder mostly met, in progress L shoulder (see chart above).  Goal status: IN PROGRESS     PLAN: PT FREQUENCY: 1x/week   PT DURATION: 4 weeks   PLANNED INTERVENTIONS: Therapeutic exercises, Therapeutic activity, Neuromuscular re-education, Balance training, Gait training, Patient/Family education, Dry Needling, Electrical stimulation, Cryotherapy, Moist heat, Taping, Ultrasound, and Manual therapy   PLAN FOR NEXT SESSION: Continue with work on shoulder/UE ROM, gripping and fine motor grasping. Dynamic postural control and gait stability. Pt to continue with graded activity with home-based program.    Consuela Mimes, PT, DPT [phone]  09/28/2022, 8:35 AM

## 2022-10-05 ENCOUNTER — Ambulatory Visit: Payer: 59 | Attending: Physician Assistant | Admitting: Physical Therapy

## 2022-10-05 ENCOUNTER — Encounter: Payer: Self-pay | Admitting: Physical Therapy

## 2022-10-05 DIAGNOSIS — R2689 Other abnormalities of gait and mobility: Secondary | ICD-10-CM | POA: Diagnosis not present

## 2022-10-05 DIAGNOSIS — M79621 Pain in right upper arm: Secondary | ICD-10-CM | POA: Insufficient documentation

## 2022-10-05 DIAGNOSIS — M6281 Muscle weakness (generalized): Secondary | ICD-10-CM | POA: Diagnosis not present

## 2022-10-05 DIAGNOSIS — R262 Difficulty in walking, not elsewhere classified: Secondary | ICD-10-CM | POA: Insufficient documentation

## 2022-10-05 DIAGNOSIS — Z981 Arthrodesis status: Secondary | ICD-10-CM | POA: Diagnosis not present

## 2022-10-05 NOTE — Therapy (Signed)
OUTPATIENT PHYSICAL THERAPY TREATMENT    Patient Name: Tom Barrett MRN: [id number] DOB:Jul 11, 1960, 62 y.o., male Today's Date: 10/05/2022  END OF SESSION:   PT End of Session - 10/05/22 1232     Visit Number 21    Number of Visits 25    Authorization Type [employer] 2024, 30 per year PT/OT/speech    Progress Note Due on Visit 25    PT Start Time 1241    PT Stop Time 1327    PT Time Calculation (min) 46 min    Equipment Utilized During Treatment Gait belt   Gait belt for balance testing/training   Activity Tolerance Patient tolerated treatment well;Patient limited by pain    Behavior During Therapy WFL for tasks assessed/performed                 Past Medical History:  Diagnosis Date   Anemia    Arthritis    Carpal tunnel syndrome, bilateral    Complication of anesthesia    at 62 years old, he woke up being violent   Depression    Headache    eye migraine occasionally   Heberden's nodes    History of kidney stones    feels like he has passed a few stones   Hyperlipemia    Hypertension    Hypokalemia    Hypothyroidism    Malaise and fatigue    Sleep apnea    does not tolerate CPAP   Past Surgical History:  Procedure Laterality Date   CARPAL TUNNEL RELEASE Right 06/13/2020   Procedure: CARPAL TUNNEL RELEASE ENDOSCOPIC;  Surgeon: Christena Flake, MD;  Location: [facility];  Service: Orthopedics;  Laterality: Right;   CARPAL TUNNEL RELEASE Left 10/09/2020   Procedure: LEFT CARPAL TUNNEL RELEASE ENDOSCOPIC WITH SUBCUTANEOUS ANTERIOR TRANSPOSITION OF ULNER NERVE AT LEFT ELBOW.;  Surgeon: Christena Flake, MD;  Location: [facility];  Service: Orthopedics;  Laterality: Left;   CIRCUMCISION     as a teenager    COLONOSCOPY WITH PROPOFOL N/A 10/29/2014   Procedure: COLONOSCOPY WITH PROPOFOL;  Surgeon: Elnita Maxwell, MD;  Location: [facility];  Service: Endoscopy;  Laterality: N/A;   POSTERIOR CERVICAL LAMINECTOMY  12/23/2020   Procedure: POSTERIOR CERVICAL LAMINECTOMY  C3-6;  Surgeon: Lucy Chris, MD;  Location: [facility];  Service: Neurosurgery;;   TOTAL KNEE ARTHROPLASTY Right 11/09/2017   Procedure: TOTAL KNEE ARTHROPLASTY;  Surgeon: Christena Flake, MD;  Location: [facility];  Service: Orthopedics;  Laterality: Right;   TOTAL KNEE ARTHROPLASTY Left 10/10/2019   Procedure: TOTAL KNEE ARTHROPLASTY;  Surgeon: Christena Flake, MD;  Location: [facility];  Service: Orthopedics;  Laterality: Left;   Patient Active Problem List   Diagnosis Date Noted   Cervical stenosis of spinal canal 12/23/2020   Primary osteoarthritis of left knee 04/01/2018   Status post total knee replacement using cement, right 11/09/2017    PCP: Olena Leatherwood, FNP  REFERRING PROVIDER: Douglass Rivers, PA*   REFERRING DIAG:  Z98.1 (ICD-10-CM) - S/P cervical spinal fusion   THERAPY DIAG:  Pain in right upper arm  Muscle weakness (generalized)  S/P cervical spinal fusion  Difficulty in walking, not elsewhere classified  Imbalance  Rationale for Evaluation and Treatment Rehabilitation  PERTINENT HISTORY: Pt is a 62 year old male s/p C4-7 ACDF 04/13/22; Hx of previous posterior cervical spine laminectomy C3-6; Hx of T6-8 laminoplasty in November 2023. 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 He reports immediate improvement in his R arm. He reports notable weakness  with R arm; he reports ruling out RTC injury. Pt feels that home health therapy did help. Patient reports minimal strength in L hand. Pt reports some residual numbness affecting L>RUE. Patient reports history of forearm pain along extensors in each forearm that has improved. Pt reports not having major pain at arrival. He reports sensation of fullness in his hands. He reports mainly having numbness and weakness. Pt denies hypersensitivity or pain with light touch along L arm region. Pt using gabapentin for symptom management.    Pain:  Pain Intensity: Present: 0/10, Best: 0/10, Worst: 6/10 Pain location: L upper arm along  distal 1/3 laterally Pain Quality:  numbness, sharp pain along L arm    Radiating: No, symptoms are localized along R arm  Numbness/Tingling: Yes,sensory loss, no paresthesias Focal Weakness: Yes, L shoulder L>R hand  Aggravating factors: neck stretches given from hospital Relieving factors: nothing seems to help arms, Gabapentin 24-hour pain behavior: AM is worst  History of prior neck injury, pain, surgery, or therapy: Yes; complex surgical history (see above)  Falls: Has patient fallen in last 6 months? No, Number of falls: N/A Follow-up appointment with MD: Yes, this Wednesday 07/08/22 Dominant hand: right Imaging: Yes ;   Post-op radiographs negative for any acute abnormalities/hardware issues     Prior level of function: Independent with community mobility with device; pt uses walking stick outside; IND with self-care/hygiene  Occupational demands: Pt out of work from previous job  Hobbies: fishing, able to play with grandchildren (4 y/o, 2y/o, 1 y/o)   Red flags (personal history of cancer, h/o spinal tumors, history of compression fracture, chills/fever, night sweats, nausea, vomiting, unrelenting pain): Negative   Weight Bearing Restrictions: No   Living Environment Lives with: lives with their spouse; wife is able to help patient at home. Pt is alone during the day when wife is at work. Grab bars in bathroom. Built-in bench. Pt has non-skid mat. 6 stairs to get in home with rails on both side. Flat terrain to get into home - sidewalk up to his front door  Lives in: House/apartment     Patient Goals:  More use of his hands and arms; would like to walk better       PRECAUTIONS: No lifting > 20 lbs, impaired gait/fall risk     OBJECTIVE: (objective measures completed at initial evaluation unless otherwise dated)  Gait Abductor lurch toward his L side, decreased TKE at terminal swing, dec arm swing and stiff trunk. Overpronation of bilat feet.    Posture Guarded  posture, rounded shoulders      AROM AROM (Normal range in degrees) AROM 07/08/2022  Cervical  Flexion (50) 25  Extension (80) 30  Right lateral flexion (45) 28  Left lateral flexion (45) 23  Right rotation (85) 42  Left rotation (85) 33  (* = pain; Blank rows = not tested)     Shoulder AROM Shoulder flexion: R 144, L 30 Shoulder abduction: R 131, L 38  Shoulder ER: R WNL, L 47   08/10/22 Shoulder flexion: R 166, L 58 Shoulder abduction: R 151, L 55  Shoulder ER: R WNL, L 61   09/28/22 Shoulder flexion: R 157, L 64  (In supine: R WFL, L 105) Shoulder abduction: R 160, L 62  Shoulder ER: R WNL, L 61    MMT MMT (out of 5) Right 07/08/2022 Left 07/08/2022 Right 08/10/22 Left 08/10/22 Right 09/28/22 Left 09/28/22  Cervical (isometric)      Flexion WNL  Extension WNL      Lateral Flexion WNL WNL      Rotation WNL WNL                 Shoulder       Flexion 4 2- 4 2- 4+ 2-  Extension          Abduction 4 2- 4 2- 4 2-  Internal rotation 4 4 4  4+ 5 4+  External rotation  4 3+ 4 3+ 4+ 4-  Horizontal abduction          Horizontal adduction          Lower Trapezius          Rhomboids                     Elbow      Flexion 4 3+ 4+ 4 5 4   Extension 4 4 4+ 4 5 4+  Pronation          Supination                     (* = pain; Blank rows = not tested)     LE MMT Hip flexion: R 4+, L 4- Quad: R 5-, L 4+ Ankle DF: R 4+, L 4+ Hip ABD: R 4, L 4     GRIP STRENGTH DYNAMOMETRY (07/09/22) Best of 3 trials R: 71.3,  L: 41.6   08/10/22 R: 63 lbs, L: 52.9 lbs   09/28/22 R: 66.8 lbs, L: 62.3 lbs       TODAY'S TREATMENT      SUBJECTIVE STATEMENT:  Patient  reports feeling numb and tight today in bilat upper limbs. He reports having one night during which his walking was bad, but he has not experienced recent fall or near-fall incident. Pt is compliant with HEP. He denies notable pain this afternoon - primary c/o numbness in L>R upper limb to hand/fingers.       Therapeutic Exercise - for improved soft tissue flexibility and extensibility as needed for ROM, shoulder AROM and exercises to improve forward reaching and shoulder elevation     Nustep; x 5 minutes, Level 4 - for LE soft tissue mobility and warm-up as well as upper limb AAROM  Supine chop with L arm, 90 deg shoulder flexion; 1-lb Dell; 2 x 20 reps  Supine ABCs with L arm; A-Z   Median nerve glides, standing; 2x10 (ULTT2A technique)   Finger ladder; x 4, 3-5 sec hold at top, CGA with PT gently guarding L upper limb to prevent uncontrolled descent of arm due to known sensory changes and motor control deficits     PATIENT EDUCATION: Discussed current progress with PT, rate of healing with peripheral nerve healing/impairment, prognosis, continued POC. We discussed strategies for sensory stimulation at home (moving hand in bed of rice/bed of uncooked beans with duration as tolerated).      *not today* Ball up wall, pt standing at wall; yellow physioball; 1x15 bilat UE on ball together  Single-arm shoulder extension with L upper limb, Red Tband; 2x10 for AAROM forward elevation  Supine shoulder flexion AROM with LUE with therapist assist to get best ROM; up to 90 deg obtained in supine; 2x8  Standing wand shoulder flexion AAROM; 2x10, back to wall to maintain upright position Bow and arrow thoracic rotation in sidelying; R and L x 10  Standing gear shift with tall dowel; dowel anchored to floor with pt performing forward reaching  in sagittal and scapular plane  -Forward reaching x 20  -Scapular plane reaching x 20  Shoulder flexion slide on incline (towel sliding on treatment table with foot of bed reclined); x15 with raised incline, 60 deg Scapular retractions; 1 x 10, 5 sec hold -  for HEP addition Pulleys, seated with pulley anchored in doorway; shoulder flexion and abduction x 20  Tband high row for forward reaching/shoulder elevation with band anchored at top of door; Red Tband;  2x10  Wand shoulder flexion/abduction AAROM in supine; 2x10    Ball across table, into abduction, Silver physioball; 2x10 bilat UE      Neuromuscular Re-education - for improved sensory integration, static and dynamic postural control, equilibrium and non-equilibrium coordination as needed for negotiating home and community environment and stepping over obstacles   Hurdle stepping; (3) 6-inch hurdles and (2) 12-inch hurdles forward stepping D/B // bars x 4 D/B  -difficulty clearing 12-inch hurdle with trail leg   Sharpened Romberg: RLE in back 25 sec, LLE in back 20 sec       *next visit*  On blue agility ladder, high knees with opposite knee tap; 4x  D/B   Alternating lateral cone taps, 4 cones on R and 4 on L; D/B length of bars x 3   *not today* Forward step up, 6-inch step; staircase in center of gym; 1x10  Hurdle step over single obstacle, lateral step-over today with both feet on either side of obstacle (used dumbbell today); 2x10 alternating R/L  Alternating foot taps on 12" step (staircase in center of gym, 2 stacked steps) without UE support:  20x   Obstacle course  Hurdle stepping; 3 6-inch hurdles, forward stepping; performed 4x D/B with step-to pattern with sound clearance  Lateral hurdle stepping; 3 6-inch hurdles; 3x D/B   PATIENT EDUCATION:  Education details: Plan of care Person educated: Patient Education method: Explanation Education comprehension: verbalized understanding     HOME EXERCISE PROGRAM: Access Code: [email or web address] URL: [email or web address] Date: 09/21/2022 Prepared by: Consuela Mimes  Exercises - Putty Squeezes  - 2 x daily - 7 x weekly - 1-2 sets - 2-3 min hold - Supine Shoulder Flexion with Dowel  - 2 x daily - 7 x weekly - 2 sets - 10 reps - Supine Shoulder External Rotation in 45 Degrees Abduction AAROM with Dowel  - 2 x daily - 7 x weekly - 2 sets - 10 reps - Seated Shoulder Flexion AAROM with Pulley Behind  - 2 x daily - 7  x weekly - 2-3 mins hold - Supine Shoulder Flexion Extension Full Range AROM  - 2 x daily - 7 x weekly - 2 sets - 8-10 reps - Seated Shoulder Flexion Towel Slide at Table Top  - 1 x daily - 7 x weekly - 2 sets - 10 reps - Median Nerve Flossing  - 1 x daily - 7 x weekly - 2 sets - 10 reps - Sit to Stand  - 2 x daily - 7 x weekly - 2 sets - 10 reps - Standing Anterior Toe Taps  - 2 x daily - 7 x weekly - 2 sets - 10 reps - Forward and Backward Step Over with Counter Support  - 2 x daily - 7 x weekly - 2 sets - 10 reps - Forward Step Up with Counter Support  - 1 x daily - 7 x weekly - 2 sets - 10 reps     ASSESSMENT:   CLINICAL IMPRESSION: Patient demonstrates supine  L shoulder flexion AROM up to grossly 120 deg. We are able to progress mid-range stabilization/strengthening work with addition of dumbbell with supine chops and completion of supine ABCs to work on multi-planar movement with sustained forward elevation and train proprioception/kinesthetic sense. Patient has not had recent fall or near-fall, but he does have intermittent LOB when changing direction during stepping and with following negotiation of obstacle. Pt does have notable truncal stiffness and this does limit postural righting reactions. Patient has remaining deficits in L>R shoulder AROM, L>R upper limb strength including impaired grip strength, postural changes, postural control during gait/dynamic balance, dec neural/dural mobility, postural changes, and pain/L>R upper limb paresthesias. Patient will benefit from continued skilled therapeutic intervention to address the above deficits as needed for improved function and QoL.    REHAB POTENTIAL: Fair given complicated musculoskeletal history with multiple spinal surgeries, bilateral upper limb involvement   CLINICAL DECISION MAKING: Unstable/unpredictable   EVALUATION COMPLEXITY: High     GOALS: Goals reviewed with patient? Yes   SHORT TERM GOALS: Target date: 07/29/2022    Pt will be independent with HEP to improve strength and decrease neck pain to improve pain-free function at home and work. Baseline: 07/06/22: HEP to be initiated and reviewed next visit.    08/10/22: Compliant with HEP  Goal status: ACHIEVED     LONG TERM GOALS: Target date: 09/17/2022   Pt will increase FOTO to at least 57 to demonstrate significant improvement in function at home and work related to neck pain  Baseline: 07/06/22: 48    08/10/22: 47/57   09/28/22:  45/57 Goal status: NOT MET    2.  Patient will have active R shoulder flexion and abduction to 130 deg or greater as needed for functional reaching, self-care activities, household work Baseline: 07/06/22: R shoulder AROM flexion 30, ABD 38.    08/10/22:  R shoulder AROM flexion 58, ABD 55.    09/28/22: R shoulder AROM flexion 64, ABD 62.   Goal status: ON-GOING   3.  Pt will have grip strength (tested by dynamometer) within standard error of age-based/gender-based norm (R hand 88.2 lbs, L hand 83.78 lbs) indicative of improved ability to perform gripping/prehensile activities with each upper limb  Baseline: 07/06/22: Grip strength baseline to be formally tested next visit.   07/09/22: R: 71.3,  L: 41.6     08/10/22: R: 63 lbs, L: 52.9 lbs    09/28/22: R: 66.8 lbs, L: 62.3 lbs  Goal status: IN PROGRESS   4.  Pt will score DGI > or equal to 19 indicative of no increased risk of falling  Baseline: 07/06/22: Baseline to be obtained on visit # 3.  07/13/22: 16/24.    08/10/22: Deferred to next visit.    08/12/22: 16/24   09/28/22: 17/24 Goal status: NOT MET    5.  Pt will improve bilateral upper limb MMTs to 4+/5 indicative of improved strength as needed for moving items, carrying, lifting within post-op restrictions Baseline: 07/06/22: RUE strength grossly 4/5, LUE 2- to 4.    08/10/22: Improved R elbow MMT and L shoulder IR MMT, otherwise unchanged.    09/28/22: R shoulder mostly met, in progress L shoulder (see chart above).  Goal status: IN PROGRESS      PLAN: PT FREQUENCY: 1x/week   PT DURATION: 4 weeks   PLANNED INTERVENTIONS: Therapeutic exercises, Therapeutic activity, Neuromuscular re-education, Balance training, Gait training, Patient/Family education, Dry Needling, Electrical stimulation, Cryotherapy, Moist heat, Taping, Ultrasound, and Manual therapy   PLAN  FOR NEXT SESSION: Continue with work on shoulder/UE ROM, gripping and fine motor grasping. Dynamic postural control and gait stability. Pt to continue with graded activity with home-based program.    Consuela Mimes, PT, DPT [phone]  10/05/2022, 1:28 PM

## 2022-10-12 ENCOUNTER — Ambulatory Visit: Payer: 59 | Admitting: Physical Therapy

## 2022-10-12 ENCOUNTER — Encounter: Payer: Self-pay | Admitting: Physical Therapy

## 2022-10-12 DIAGNOSIS — R262 Difficulty in walking, not elsewhere classified: Secondary | ICD-10-CM | POA: Diagnosis not present

## 2022-10-12 DIAGNOSIS — R2689 Other abnormalities of gait and mobility: Secondary | ICD-10-CM

## 2022-10-12 DIAGNOSIS — M79621 Pain in right upper arm: Secondary | ICD-10-CM | POA: Diagnosis not present

## 2022-10-12 DIAGNOSIS — M6281 Muscle weakness (generalized): Secondary | ICD-10-CM | POA: Diagnosis not present

## 2022-10-12 DIAGNOSIS — Z981 Arthrodesis status: Secondary | ICD-10-CM

## 2022-10-12 NOTE — Therapy (Signed)
OUTPATIENT PHYSICAL THERAPY TREATMENT    Patient Name: Tom Barrett MRN: [id number] DOB:1961-02-27, 62 y.o., male Today's Date: 10/12/2022  END OF SESSION:   PT End of Session - 10/12/22 0735     Visit Number 22    Number of Visits 25    Authorization Type [employer] 2024, 30 per year PT/OT/speech    Progress Note Due on Visit 25    PT Start Time 0731    PT Stop Time 0814    PT Time Calculation (min) 43 min    Equipment Utilized During Treatment Gait belt   Gait belt for balance testing/training   Activity Tolerance Patient tolerated treatment well;Patient limited by pain    Behavior During Therapy WFL for tasks assessed/performed              Past Medical History:  Diagnosis Date   Anemia    Arthritis    Carpal tunnel syndrome, bilateral    Complication of anesthesia    at 62 years old, he woke up being violent   Depression    Headache    eye migraine occasionally   Heberden's nodes    History of kidney stones    feels like he has passed a few stones   Hyperlipemia    Hypertension    Hypokalemia    Hypothyroidism    Malaise and fatigue    Sleep apnea    does not tolerate CPAP   Past Surgical History:  Procedure Laterality Date   CARPAL TUNNEL RELEASE Right 06/13/2020   Procedure: CARPAL TUNNEL RELEASE ENDOSCOPIC;  Surgeon: Christena Flake, MD;  Location: [facility];  Service: Orthopedics;  Laterality: Right;   CARPAL TUNNEL RELEASE Left 10/09/2020   Procedure: LEFT CARPAL TUNNEL RELEASE ENDOSCOPIC WITH SUBCUTANEOUS ANTERIOR TRANSPOSITION OF ULNER NERVE AT LEFT ELBOW.;  Surgeon: Christena Flake, MD;  Location: [facility];  Service: Orthopedics;  Laterality: Left;   CIRCUMCISION     as a teenager    COLONOSCOPY WITH PROPOFOL N/A 10/29/2014   Procedure: COLONOSCOPY WITH PROPOFOL;  Surgeon: Elnita Maxwell, MD;  Location: [facility];  Service: Endoscopy;  Laterality: N/A;   POSTERIOR CERVICAL LAMINECTOMY  12/23/2020   Procedure: POSTERIOR CERVICAL LAMINECTOMY C3-6;   Surgeon: Lucy Chris, MD;  Location: [facility];  Service: Neurosurgery;;   TOTAL KNEE ARTHROPLASTY Right 11/09/2017   Procedure: TOTAL KNEE ARTHROPLASTY;  Surgeon: Christena Flake, MD;  Location: [facility];  Service: Orthopedics;  Laterality: Right;   TOTAL KNEE ARTHROPLASTY Left 10/10/2019   Procedure: TOTAL KNEE ARTHROPLASTY;  Surgeon: Christena Flake, MD;  Location: [facility];  Service: Orthopedics;  Laterality: Left;   Patient Active Problem List   Diagnosis Date Noted   Cervical stenosis of spinal canal 12/23/2020   Primary osteoarthritis of left knee 04/01/2018   Status post total knee replacement using cement, right 11/09/2017    PCP: Olena Leatherwood, FNP  REFERRING PROVIDER: Douglass Rivers, PA*   REFERRING DIAG:  Z98.1 (ICD-10-CM) - S/P cervical spinal fusion   THERAPY DIAG:  Pain in right upper arm  Muscle weakness (generalized)  S/P cervical spinal fusion  Difficulty in walking, not elsewhere classified  Imbalance  Rationale for Evaluation and Treatment Rehabilitation  PERTINENT HISTORY: Pt is a 62 year old male s/p C4-7 ACDF 04/13/22; Hx of previous posterior cervical spine laminectomy C3-6; Hx of T6-8 laminoplasty in November 2023. 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 He reports immediate improvement in his R arm. He reports notable weakness with R arm;  he reports ruling out RTC injury. Pt feels that home health therapy did help. Patient reports minimal strength in L hand. Pt reports some residual numbness affecting L>RUE. Patient reports history of forearm pain along extensors in each forearm that has improved. Pt reports not having major pain at arrival. He reports sensation of fullness in his hands. He reports mainly having numbness and weakness. Pt denies hypersensitivity or pain with light touch along L arm region. Pt using gabapentin for symptom management.    Pain:  Pain Intensity: Present: 0/10, Best: 0/10, Worst: 6/10 Pain location: L upper arm along distal  1/3 laterally Pain Quality:  numbness, sharp pain along L arm    Radiating: No, symptoms are localized along R arm  Numbness/Tingling: Yes,sensory loss, no paresthesias Focal Weakness: Yes, L shoulder L>R hand  Aggravating factors: neck stretches given from hospital Relieving factors: nothing seems to help arms, Gabapentin 24-hour pain behavior: AM is worst  History of prior neck injury, pain, surgery, or therapy: Yes; complex surgical history (see above)  Falls: Has patient fallen in last 6 months? No, Number of falls: N/A Follow-up appointment with MD: Yes, this Wednesday 07/08/22 Dominant hand: right Imaging: Yes ;   Post-op radiographs negative for any acute abnormalities/hardware issues     Prior level of function: Independent with community mobility with device; pt uses walking stick outside; IND with self-care/hygiene  Occupational demands: Pt out of work from previous job  Hobbies: fishing, able to play with grandchildren (4 y/o, 2y/o, 1 y/o)   Red flags (personal history of cancer, h/o spinal tumors, history of compression fracture, chills/fever, night sweats, nausea, vomiting, unrelenting pain): Negative   Weight Bearing Restrictions: No   Living Environment Lives with: lives with their spouse; wife is able to help patient at home. Pt is alone during the day when wife is at work. Grab bars in bathroom. Built-in bench. Pt has non-skid mat. 6 stairs to get in home with rails on both side. Flat terrain to get into home - sidewalk up to his front door  Lives in: House/apartment     Patient Goals:  More use of his hands and arms; would like to walk better       PRECAUTIONS: No lifting > 20 lbs, impaired gait/fall risk     OBJECTIVE: (objective measures completed at initial evaluation unless otherwise dated)  Gait Abductor lurch toward his L side, decreased TKE at terminal swing, dec arm swing and stiff trunk. Overpronation of bilat feet.    Posture Guarded posture,  rounded shoulders      AROM AROM (Normal range in degrees) AROM 07/08/2022  Cervical  Flexion (50) 25  Extension (80) 30  Right lateral flexion (45) 28  Left lateral flexion (45) 23  Right rotation (85) 42  Left rotation (85) 33  (* = pain; Blank rows = not tested)     Shoulder AROM Shoulder flexion: R 144, L 30 Shoulder abduction: R 131, L 38  Shoulder ER: R WNL, L 47   08/10/22 Shoulder flexion: R 166, L 58 Shoulder abduction: R 151, L 55  Shoulder ER: R WNL, L 61   09/28/22 Shoulder flexion: R 157, L 64  (In supine: R WFL, L 105) Shoulder abduction: R 160, L 62  Shoulder ER: R WNL, L 61    MMT MMT (out of 5) Right 07/08/2022 Left 07/08/2022 Right 08/10/22 Left 08/10/22 Right 09/28/22 Left 09/28/22  Cervical (isometric)      Flexion WNL  Extension WNL      Lateral Flexion WNL WNL      Rotation WNL WNL                 Shoulder       Flexion 4 2- 4 2- 4+ 2-  Extension          Abduction 4 2- 4 2- 4 2-  Internal rotation 4 4 4  4+ 5 4+  External rotation  4 3+ 4 3+ 4+ 4-  Horizontal abduction          Horizontal adduction          Lower Trapezius          Rhomboids                     Elbow      Flexion 4 3+ 4+ 4 5 4   Extension 4 4 4+ 4 5 4+  Pronation          Supination                     (* = pain; Blank rows = not tested)     LE MMT Hip flexion: R 4+, L 4- Quad: R 5-, L 4+ Ankle DF: R 4+, L 4+ Hip ABD: R 4, L 4     GRIP STRENGTH DYNAMOMETRY (07/09/22) Best of 3 trials R: 71.3,  L: 41.6   08/10/22 R: 63 lbs, L: 52.9 lbs   09/28/22 R: 66.8 lbs, L: 62.3 lbs       TODAY'S TREATMENT      SUBJECTIVE STATEMENT:  Patient  reports that his steadiness when walking is highly variable and can randomly be worse in some days. He reports increasing tightness in L>R upper limb over last 2 weeks and sensation of "fullness" in upper limbs. He does have intermittently worsening numbness in L>R upper limb with activity.      Therapeutic Exercise -  for improved soft tissue flexibility and extensibility as needed for ROM, shoulder AROM and exercises to improve forward reaching and shoulder elevation     Nustep; x 5 minutes, Level 3 - for LE soft tissue mobility and warm-up as well as upper limb AAROM   Supine L shoulder flexion AROM; 1x15   Supine chop with L arm, 90 deg shoulder flexion; 1-lb Dell; 2 x 20 reps    Finger ladder; x 4, 3-5 sec hold at top, CGA with PT gently guarding L upper limb to prevent uncontrolled descent of arm due to known sensory changes and motor control deficits     PATIENT EDUCATION: Discussed current progress with PT, rate of healing with peripheral nerve healing/impairment, prognosis, continued POC. We discussed strategies for sensory stimulation at home (moving hand in bed of rice/bed of uncooked beans with duration as tolerated).      *not today* Supine ABCs with L arm; A-Z Median nerve glides, standing; 2x10 (ULTT2A technique) Ball up wall, pt standing at wall; yellow physioball; 1x15 bilat UE on ball together  Single-arm shoulder extension with L upper limb, Red Tband; 2x10 for AAROM forward elevation  Supine shoulder flexion AROM with LUE with therapist assist to get best ROM; up to 90 deg obtained in supine; 2x8  Standing wand shoulder flexion AAROM; 2x10, back to wall to maintain upright position Bow and arrow thoracic rotation in sidelying; R and L x 10  Standing gear shift with tall dowel; dowel anchored to floor with pt  performing forward reaching in sagittal and scapular plane  -Forward reaching x 20  -Scapular plane reaching x 20  Shoulder flexion slide on incline (towel sliding on treatment table with foot of bed reclined); x15 with raised incline, 60 deg Scapular retractions; 1 x 10, 5 sec hold -  for HEP addition Pulleys, seated with pulley anchored in doorway; shoulder flexion and abduction x 20  Tband high row for forward reaching/shoulder elevation with band anchored at top of door; Red  Tband; 2x10  Wand shoulder flexion/abduction AAROM in supine; 2x10    Ball across table, into abduction, Silver physioball; 2x10 bilat UE      Neuromuscular Re-education - for improved sensory integration, static and dynamic postural control, equilibrium and non-equilibrium coordination as needed for negotiating home and community environment and stepping over obstacles   Hurdle stepping; (3) 6-inch hurdles, forward and lateral stepping; D/B // bars x 4 D/B each   Airex forward step up; 2 x 10      *next visit*  Sharpened Romberg: RLE in back 25 sec, LLE in back 20 sec  On blue agility ladder, high knees with opposite knee tap; 4x  D/B   Alternating lateral cone taps, 4 cones on R and 4 on L; D/B length of bars x 3   *not today* Forward step up, 6-inch step; staircase in center of gym; 1x10  Hurdle step over single obstacle, lateral step-over today with both feet on either side of obstacle (used dumbbell today); 2x10 alternating R/L  Alternating foot taps on 12" step (staircase in center of gym, 2 stacked steps) without UE support:  20x   Obstacle course  Hurdle stepping; 3 6-inch hurdles, forward stepping; performed 4x D/B with step-to pattern with sound clearance  Lateral hurdle stepping; 3 6-inch hurdles; 3x D/B   PATIENT EDUCATION:  Education details: Plan of care Person educated: Patient Education method: Explanation Education comprehension: verbalized understanding     HOME EXERCISE PROGRAM: Access Code: [email or web address] URL: [email or web address] Date: 09/21/2022 Prepared by: Consuela Mimes  Exercises - Putty Squeezes  - 2 x daily - 7 x weekly - 1-2 sets - 2-3 min hold - Supine Shoulder Flexion with Dowel  - 2 x daily - 7 x weekly - 2 sets - 10 reps - Supine Shoulder External Rotation in 45 Degrees Abduction AAROM with Dowel  - 2 x daily - 7 x weekly - 2 sets - 10 reps - Seated Shoulder Flexion AAROM with Pulley Behind  - 2 x daily - 7 x weekly - 2-3 mins  hold - Supine Shoulder Flexion Extension Full Range AROM  - 2 x daily - 7 x weekly - 2 sets - 8-10 reps - Seated Shoulder Flexion Towel Slide at Table Top  - 1 x daily - 7 x weekly - 2 sets - 10 reps - Median Nerve Flossing  - 1 x daily - 7 x weekly - 2 sets - 10 reps - Sit to Stand  - 2 x daily - 7 x weekly - 2 sets - 10 reps - Standing Anterior Toe Taps  - 2 x daily - 7 x weekly - 2 sets - 10 reps - Forward and Backward Step Over with Counter Support  - 2 x daily - 7 x weekly - 2 sets - 10 reps - Forward Step Up with Counter Support  - 1 x daily - 7 x weekly - 2 sets - 10 reps     ASSESSMENT:   CLINICAL IMPRESSION: Patient demonstrates  sound foot clearance with hurdle stepping today. He has ongoing significant L shoulder mobility deficits. Passively, pt has L shoulder forward elevation up to 130 deg, but actively he is only able to elevate L shoulder to 70 deg. Pt has various impairments related to Hx of cervical radiculopathy, thoracic radiculopathy, and Hx of ACDF. Pt has made notable progress to date, but he has persisting sensory and motor impairments that do limit his ability to perform fine motor skills and grasping tasks. Patient has remaining deficits in L>R shoulder AROM, L>R upper limb strength including impaired grip strength, postural changes, postural control during gait/dynamic balance, dec neural/dural mobility, postural changes, and pain/L>R upper limb paresthesias. Patient will benefit from continued skilled therapeutic intervention to address the above deficits as needed for improved function and QoL.    REHAB POTENTIAL: Fair given complicated musculoskeletal history with multiple spinal surgeries, bilateral upper limb involvement   CLINICAL DECISION MAKING: Unstable/unpredictable   EVALUATION COMPLEXITY: High     GOALS: Goals reviewed with patient? Yes   SHORT TERM GOALS: Target date: 07/29/2022   Pt will be independent with HEP to improve strength and decrease neck  pain to improve pain-free function at home and work. Baseline: 07/06/22: HEP to be initiated and reviewed next visit.    08/10/22: Compliant with HEP  Goal status: ACHIEVED     LONG TERM GOALS: Target date: 09/17/2022   Pt will increase FOTO to at least 57 to demonstrate significant improvement in function at home and work related to neck pain  Baseline: 07/06/22: 48    08/10/22: 47/57   09/28/22:  45/57 Goal status: NOT MET    2.  Patient will have active R shoulder flexion and abduction to 130 deg or greater as needed for functional reaching, self-care activities, household work Baseline: 07/06/22: R shoulder AROM flexion 30, ABD 38.    08/10/22:  R shoulder AROM flexion 58, ABD 55.    09/28/22: R shoulder AROM flexion 64, ABD 62.   Goal status: ON-GOING   3.  Pt will have grip strength (tested by dynamometer) within standard error of age-based/gender-based norm (R hand 88.2 lbs, L hand 83.78 lbs) indicative of improved ability to perform gripping/prehensile activities with each upper limb  Baseline: 07/06/22: Grip strength baseline to be formally tested next visit.   07/09/22: R: 71.3,  L: 41.6     08/10/22: R: 63 lbs, L: 52.9 lbs    09/28/22: R: 66.8 lbs, L: 62.3 lbs  Goal status: IN PROGRESS   4.  Pt will score DGI > or equal to 19 indicative of no increased risk of falling  Baseline: 07/06/22: Baseline to be obtained on visit # 3.  07/13/22: 16/24.    08/10/22: Deferred to next visit.    08/12/22: 16/24   09/28/22: 17/24 Goal status: NOT MET    5.  Pt will improve bilateral upper limb MMTs to 4+/5 indicative of improved strength as needed for moving items, carrying, lifting within post-op restrictions Baseline: 07/06/22: RUE strength grossly 4/5, LUE 2- to 4.    08/10/22: Improved R elbow MMT and L shoulder IR MMT, otherwise unchanged.    09/28/22: R shoulder mostly met, in progress L shoulder (see chart above).  Goal status: IN PROGRESS     PLAN: PT FREQUENCY: 1x/week   PT DURATION: 4 weeks   PLANNED  INTERVENTIONS: Therapeutic exercises, Therapeutic activity, Neuromuscular re-education, Balance training, Gait training, Patient/Family education, Dry Needling, Electrical stimulation, Cryotherapy, Moist heat, Taping, Ultrasound, and Manual therapy  PLAN FOR NEXT SESSION: Continue with work on shoulder/UE ROM, gripping and fine motor grasping. Dynamic postural control and gait stability. Pt to continue with graded activity with home-based program.    Consuela Mimes, PT, DPT [phone]  10/12/2022, 7:35 AM

## 2022-10-13 DIAGNOSIS — I1A0 Resistant hypertension: Secondary | ICD-10-CM | POA: Diagnosis not present

## 2022-10-13 DIAGNOSIS — E079 Disorder of thyroid, unspecified: Secondary | ICD-10-CM | POA: Diagnosis not present

## 2022-10-19 ENCOUNTER — Ambulatory Visit: Payer: 59 | Admitting: Physical Therapy

## 2022-10-26 ENCOUNTER — Encounter: Payer: 59 | Admitting: Physical Therapy

## 2023-01-13 DIAGNOSIS — M47812 Spondylosis without myelopathy or radiculopathy, cervical region: Secondary | ICD-10-CM | POA: Diagnosis not present

## 2023-01-13 DIAGNOSIS — M5412 Radiculopathy, cervical region: Secondary | ICD-10-CM | POA: Diagnosis not present

## 2023-01-13 DIAGNOSIS — Z981 Arthrodesis status: Secondary | ICD-10-CM | POA: Diagnosis not present

## 2023-01-13 DIAGNOSIS — M4714 Other spondylosis with myelopathy, thoracic region: Secondary | ICD-10-CM | POA: Diagnosis not present

## 2023-01-13 DIAGNOSIS — R29898 Other symptoms and signs involving the musculoskeletal system: Secondary | ICD-10-CM | POA: Diagnosis not present

## 2023-02-02 DIAGNOSIS — E079 Disorder of thyroid, unspecified: Secondary | ICD-10-CM | POA: Diagnosis not present

## 2023-02-02 DIAGNOSIS — I1A0 Resistant hypertension: Secondary | ICD-10-CM | POA: Diagnosis not present

## 2023-02-05 DIAGNOSIS — M4804 Spinal stenosis, thoracic region: Secondary | ICD-10-CM | POA: Diagnosis not present

## 2023-02-05 DIAGNOSIS — Z981 Arthrodesis status: Secondary | ICD-10-CM | POA: Diagnosis not present

## 2023-02-05 DIAGNOSIS — M4714 Other spondylosis with myelopathy, thoracic region: Secondary | ICD-10-CM | POA: Diagnosis not present

## 2023-02-05 DIAGNOSIS — R29898 Other symptoms and signs involving the musculoskeletal system: Secondary | ICD-10-CM | POA: Diagnosis not present

## 2023-02-05 DIAGNOSIS — M5412 Radiculopathy, cervical region: Secondary | ICD-10-CM | POA: Diagnosis not present

## 2023-02-18 DIAGNOSIS — M4712 Other spondylosis with myelopathy, cervical region: Secondary | ICD-10-CM | POA: Diagnosis not present

## 2023-02-18 DIAGNOSIS — M4722 Other spondylosis with radiculopathy, cervical region: Secondary | ICD-10-CM | POA: Diagnosis not present

## 2023-02-18 DIAGNOSIS — M5412 Radiculopathy, cervical region: Secondary | ICD-10-CM | POA: Diagnosis not present

## 2023-02-18 DIAGNOSIS — R29898 Other symptoms and signs involving the musculoskeletal system: Secondary | ICD-10-CM | POA: Diagnosis not present

## 2023-02-18 DIAGNOSIS — Z981 Arthrodesis status: Secondary | ICD-10-CM | POA: Diagnosis not present

## 2023-02-18 DIAGNOSIS — M4714 Other spondylosis with myelopathy, thoracic region: Secondary | ICD-10-CM | POA: Diagnosis not present

## 2023-02-18 DIAGNOSIS — M4802 Spinal stenosis, cervical region: Secondary | ICD-10-CM | POA: Diagnosis not present
